# Patient Record
Sex: Female | Born: 1960 | Race: White | Hispanic: No | Smoking: Former smoker
Health system: Southern US, Community
[De-identification: ages and names within clinical notes are randomized; demographics above are authoritative.]

## PROBLEM LIST (undated history)

## (undated) DIAGNOSIS — K8050 Calculus of bile duct without cholangitis or cholecystitis without obstruction: Secondary | ICD-10-CM

## (undated) DIAGNOSIS — J45909 Unspecified asthma, uncomplicated: Secondary | ICD-10-CM

## (undated) DIAGNOSIS — E039 Hypothyroidism, unspecified: Secondary | ICD-10-CM

## (undated) DIAGNOSIS — IMO0001 Reserved for inherently not codable concepts without codable children: Secondary | ICD-10-CM

## (undated) DIAGNOSIS — C801 Malignant (primary) neoplasm, unspecified: Secondary | ICD-10-CM

## (undated) DIAGNOSIS — J449 Chronic obstructive pulmonary disease, unspecified: Secondary | ICD-10-CM

## (undated) DIAGNOSIS — I700 Atherosclerosis of aorta: Secondary | ICD-10-CM

## (undated) DIAGNOSIS — D649 Anemia, unspecified: Secondary | ICD-10-CM

## (undated) DIAGNOSIS — R634 Abnormal weight loss: Secondary | ICD-10-CM

## (undated) DIAGNOSIS — E079 Disorder of thyroid, unspecified: Secondary | ICD-10-CM

## (undated) DIAGNOSIS — J189 Pneumonia, unspecified organism: Secondary | ICD-10-CM

## (undated) HISTORY — PX: BREAST BIOPSY: SHX20

## (undated) HISTORY — PX: COLONOSCOPY: SHX174

## (undated) HISTORY — DX: Disorder of thyroid, unspecified: E07.9

---

## 2005-05-20 ENCOUNTER — Ambulatory Visit: Payer: Self-pay | Admitting: General Surgery

## 2005-05-28 ENCOUNTER — Ambulatory Visit: Payer: Self-pay | Admitting: Internal Medicine

## 2005-06-11 ENCOUNTER — Ambulatory Visit: Payer: Self-pay | Admitting: Internal Medicine

## 2006-02-10 ENCOUNTER — Ambulatory Visit: Payer: Self-pay | Admitting: Internal Medicine

## 2015-09-03 DIAGNOSIS — C3492 Malignant neoplasm of unspecified part of left bronchus or lung: Secondary | ICD-10-CM

## 2015-09-03 HISTORY — DX: Malignant neoplasm of unspecified part of left bronchus or lung: C34.92

## 2015-12-15 ENCOUNTER — Emergency Department: Payer: BLUE CROSS/BLUE SHIELD

## 2015-12-15 ENCOUNTER — Inpatient Hospital Stay
Admission: EM | Admit: 2015-12-15 | Discharge: 2015-12-17 | DRG: 193 | Disposition: A | Discharge status: Home / Self Care | Payer: BLUE CROSS/BLUE SHIELD | Attending: Specialist | Admitting: Specialist

## 2015-12-15 ENCOUNTER — Inpatient Hospital Stay: Payer: BLUE CROSS/BLUE SHIELD

## 2015-12-15 ENCOUNTER — Encounter: Payer: Self-pay | Admitting: Emergency Medicine

## 2015-12-15 DIAGNOSIS — F1010 Alcohol abuse, uncomplicated: Secondary | ICD-10-CM | POA: Diagnosis present

## 2015-12-15 DIAGNOSIS — D696 Thrombocytopenia, unspecified: Secondary | ICD-10-CM | POA: Diagnosis present

## 2015-12-15 DIAGNOSIS — Z681 Body mass index (BMI) 19 or less, adult: Secondary | ICD-10-CM | POA: Diagnosis not present

## 2015-12-15 DIAGNOSIS — J440 Chronic obstructive pulmonary disease with acute lower respiratory infection: Secondary | ICD-10-CM | POA: Diagnosis present

## 2015-12-15 DIAGNOSIS — J9811 Atelectasis: Secondary | ICD-10-CM | POA: Diagnosis present

## 2015-12-15 DIAGNOSIS — J189 Pneumonia, unspecified organism: Principal | ICD-10-CM | POA: Diagnosis present

## 2015-12-15 DIAGNOSIS — E876 Hypokalemia: Secondary | ICD-10-CM | POA: Diagnosis present

## 2015-12-15 DIAGNOSIS — E43 Unspecified severe protein-calorie malnutrition: Secondary | ICD-10-CM | POA: Diagnosis present

## 2015-12-15 DIAGNOSIS — R0602 Shortness of breath: Secondary | ICD-10-CM

## 2015-12-15 DIAGNOSIS — E440 Moderate protein-calorie malnutrition: Secondary | ICD-10-CM | POA: Insufficient documentation

## 2015-12-15 DIAGNOSIS — E861 Hypovolemia: Secondary | ICD-10-CM | POA: Diagnosis present

## 2015-12-15 DIAGNOSIS — E222 Syndrome of inappropriate secretion of antidiuretic hormone: Secondary | ICD-10-CM | POA: Diagnosis present

## 2015-12-15 DIAGNOSIS — F17210 Nicotine dependence, cigarettes, uncomplicated: Secondary | ICD-10-CM | POA: Diagnosis present

## 2015-12-15 DIAGNOSIS — D72819 Decreased white blood cell count, unspecified: Secondary | ICD-10-CM | POA: Diagnosis present

## 2015-12-15 DIAGNOSIS — A419 Sepsis, unspecified organism: Secondary | ICD-10-CM

## 2015-12-15 HISTORY — DX: Unspecified asthma, uncomplicated: J45.909

## 2015-12-15 HISTORY — DX: Chronic obstructive pulmonary disease, unspecified: J44.9

## 2015-12-15 LAB — COMPREHENSIVE METABOLIC PANEL
ALT: 73 U/L — ABNORMAL HIGH (ref 14–54)
ANION GAP: 10 (ref 5–15)
AST: 226 U/L — AB (ref 15–41)
Albumin: 3.5 g/dL (ref 3.5–5.0)
Alkaline Phosphatase: 139 U/L — ABNORMAL HIGH (ref 38–126)
BILIRUBIN TOTAL: 0.4 mg/dL (ref 0.3–1.2)
CHLORIDE: 94 mmol/L — AB (ref 101–111)
CO2: 23 mmol/L (ref 22–32)
Calcium: 7.9 mg/dL — ABNORMAL LOW (ref 8.9–10.3)
Creatinine, Ser: 0.43 mg/dL — ABNORMAL LOW (ref 0.44–1.00)
Glucose, Bld: 74 mg/dL (ref 65–99)
POTASSIUM: 3.5 mmol/L (ref 3.5–5.1)
Sodium: 127 mmol/L — ABNORMAL LOW (ref 135–145)
TOTAL PROTEIN: 6.6 g/dL (ref 6.5–8.1)

## 2015-12-15 LAB — INFLUENZA PANEL BY PCR (TYPE A & B)
H1N1 flu by pcr: NOT DETECTED
Influenza A By PCR: NEGATIVE
Influenza B By PCR: NEGATIVE

## 2015-12-15 LAB — CBC
HCT: 46.6 % (ref 35.0–47.0)
Hemoglobin: 16.2 g/dL — ABNORMAL HIGH (ref 12.0–16.0)
MCH: 35.3 pg — AB (ref 26.0–34.0)
MCHC: 34.7 g/dL (ref 32.0–36.0)
MCV: 101.8 fL — AB (ref 80.0–100.0)
PLATELETS: 149 10*3/uL — AB (ref 150–440)
RBC: 4.57 MIL/uL (ref 3.80–5.20)
RDW: 14 % (ref 11.5–14.5)
WBC: 1.1 10*3/uL — CL (ref 3.6–11.0)

## 2015-12-15 LAB — LACTIC ACID, PLASMA
LACTIC ACID, VENOUS: 1.5 mmol/L (ref 0.5–2.0)
LACTIC ACID, VENOUS: 1.1 mmol/L (ref 0.5–2.0)

## 2015-12-15 LAB — RAPID INFLUENZA A&B ANTIGENS: Influenza A (ARMC): NEGATIVE

## 2015-12-15 LAB — RAPID INFLUENZA A&B ANTIGENS (ARMC ONLY): INFLUENZA B (ARMC): NEGATIVE

## 2015-12-15 MED ORDER — SODIUM CHLORIDE 0.9 % IV BOLUS (SEPSIS)
1143.0000 mL | Freq: Once | INTRAVENOUS | Status: AC
  Administered 2015-12-15: 1143 mL via INTRAVENOUS

## 2015-12-15 MED ORDER — BENZONATATE 100 MG PO CAPS
100.0000 mg | ORAL_CAPSULE | Freq: Three times a day (TID) | ORAL | Status: DC | PRN
  Administered 2015-12-15: 100 mg via ORAL
  Filled 2015-12-15: qty 1

## 2015-12-15 MED ORDER — ENOXAPARIN SODIUM 40 MG/0.4ML ~~LOC~~ SOLN
40.0000 mg | SUBCUTANEOUS | Status: DC
  Administered 2015-12-15 – 2015-12-16 (×2): 40 mg via SUBCUTANEOUS
  Filled 2015-12-15 (×2): qty 0.4

## 2015-12-15 MED ORDER — IPRATROPIUM-ALBUTEROL 0.5-2.5 (3) MG/3ML IN SOLN: 3.0000 mL | Freq: Four times a day (QID) | RESPIRATORY_TRACT | Status: DC | PRN

## 2015-12-15 MED ORDER — IOPAMIDOL (ISOVUE-370) INJECTION 76%
75.0000 mL | Freq: Once | INTRAVENOUS | Status: AC | PRN
  Administered 2015-12-15: 75 mL via INTRAVENOUS

## 2015-12-15 MED ORDER — ONDANSETRON HCL 4 MG PO TABS: 4.0000 mg | ORAL_TABLET | Freq: Four times a day (QID) | ORAL | Status: DC | PRN

## 2015-12-15 MED ORDER — ACETAMINOPHEN 325 MG PO TABS
650.0000 mg | ORAL_TABLET | Freq: Four times a day (QID) | ORAL | Status: DC | PRN
Start: 2015-12-15 — End: 2015-12-17
  Administered 2015-12-15: 650 mg via ORAL
  Filled 2015-12-15: qty 2

## 2015-12-15 MED ORDER — LEVOFLOXACIN IN D5W 250 MG/50ML IV SOLN
250.0000 mg | INTRAVENOUS | Status: DC
  Filled 2015-12-15: qty 50

## 2015-12-15 MED ORDER — ONDANSETRON HCL 4 MG/2ML IJ SOLN: 4.0000 mg | Freq: Four times a day (QID) | INTRAMUSCULAR | Status: DC | PRN

## 2015-12-15 MED ORDER — PNEUMOCOCCAL VAC POLYVALENT 25 MCG/0.5ML IJ INJ: 0.5000 mL | INJECTION | INTRAMUSCULAR | Status: DC

## 2015-12-15 MED ORDER — DEXTROSE 5 % IV SOLN
500.0000 mg | Freq: Once | INTRAVENOUS | Status: AC
  Administered 2015-12-15: 500 mg via INTRAVENOUS
  Filled 2015-12-15: qty 500

## 2015-12-15 MED ORDER — GUAIFENESIN-DM 100-10 MG/5ML PO SYRP
5.0000 mL | ORAL_SOLUTION | ORAL | Status: DC | PRN
  Administered 2015-12-15 – 2015-12-17 (×4): 5 mL via ORAL
  Filled 2015-12-15 (×4): qty 5

## 2015-12-15 MED ORDER — DEXTROSE 5 % IV SOLN
1.0000 g | Freq: Once | INTRAVENOUS | Status: AC
  Administered 2015-12-15: 1 g via INTRAVENOUS
  Filled 2015-12-15: qty 10

## 2015-12-15 MED ORDER — SODIUM CHLORIDE 0.9 % IV SOLN
INTRAVENOUS | Status: DC
  Administered 2015-12-15 – 2015-12-16 (×3): via INTRAVENOUS

## 2015-12-15 MED ORDER — ACETAMINOPHEN 650 MG RE SUPP: 650.0000 mg | Freq: Four times a day (QID) | RECTAL | Status: DC | PRN

## 2015-12-15 MED ORDER — LEVOFLOXACIN IN D5W 500 MG/100ML IV SOLN
500.0000 mg | Freq: Once | INTRAVENOUS | Status: AC
  Administered 2015-12-16: 500 mg via INTRAVENOUS
  Filled 2015-12-15: qty 100

## 2015-12-15 NOTE — ED Notes (Signed)
Admitting MD at bedside.

## 2015-12-15 NOTE — ED Notes (Signed)
Pt ambulated to bathroom at this time independently with no concerns, pt tolerating well NAD noted, will continue to monitor. Family at bedside.

## 2015-12-15 NOTE — H&P (Signed)
[facility] [place] NAME: Lori Rangel    MR#:  [id number]  DATE OF BIRTH:  10/11/1960  DATE OF ADMISSION:  12/15/2015  PRIMARY CARE PHYSICIAN: No primary care provider on file.   REQUESTING/REFERRING PHYSICIAN: Dr. Darrick Penna  CHIEF COMPLAINT:   Chief Complaint  Patient presents with  . Cough    HISTORY OF PRESENT ILLNESS:  Lori Rangel  is a 55 y.o. female with a known history of COPD who presents to the hospital due to cough over the past week or so. Patient says that she's had a cough which is productive of yellow sputum over the past week and is somewhat improving. She admits to about a 10-13 pound weight loss over the past couple months due to poor by mouth intake. She denies any nausea, vomiting, fevers, chills, chest pain. She does admit to some shortness of breath which is mostly at exertion. She presented to the emergency room as she was not improving and her chest x-ray findings were suggestive of a right middle lobe pneumonia. Hospitalist services were contacted further treatment and evaluation.  PAST MEDICAL HISTORY:   Past Medical History  Diagnosis Date  . COPD (chronic obstructive pulmonary disease) ([place])     PAST SURGICAL HISTORY:  History reviewed. No pertinent past surgical history.  SOCIAL HISTORY:   Social History  Substance Use Topics  . Smoking status: Current Every Day Smoker -- 0.75 packs/day for 40 years    Types: Cigarettes  . Smokeless tobacco: Not on file  . Alcohol Use: 0.0 oz/week    0 Standard drinks or equivalent per week     Comment: 5 beers daily    FAMILY HISTORY:   Family History  Problem Relation Age of Onset  . Cirrhosis Father     DRUG ALLERGIES:  No Known Allergies  REVIEW OF SYSTEMS:   Review of Systems  Constitutional: Positive for weight loss. Negative for fever.  HENT: Negative for congestion, nosebleeds and tinnitus.   Eyes: Negative for blurred vision, double vision  and redness.  Respiratory: Positive for cough, sputum production and shortness of breath. Negative for hemoptysis.   Cardiovascular: Negative for chest pain, orthopnea, leg swelling and PND.  Gastrointestinal: Negative for nausea, vomiting, abdominal pain, diarrhea and melena.  Genitourinary: Negative for dysuria, urgency and hematuria.  Musculoskeletal: Negative for joint pain and falls.  Neurological: Positive for weakness. Negative for dizziness, tingling, sensory change, focal weakness, seizures and headaches.  Endo/Heme/Allergies: Negative for polydipsia. Does not bruise/bleed easily.  Psychiatric/Behavioral: Negative for depression and memory loss. The patient is not nervous/anxious.     MEDICATIONS AT HOME:   Prior to Admission medications   Not on File      VITAL SIGNS:  Blood pressure 146/66, pulse 76, temperature 98.3 F (36.8 C), temperature source Oral, resp. rate 24, height '5\' 2"'$  (1.575 m), weight 38.102 kg (84 lb), SpO2 99 %.  PHYSICAL EXAMINATION:  Physical Exam  GENERAL:  55 y.o.-year-old Cachectic patient lying in the bed in no acute distress.  EYES: Pupils equal, round, reactive to light and accommodation. No scleral icterus. Extraocular muscles intact.  HEENT: Head atraumatic, normocephalic. Oropharynx and nasopharynx clear. No oropharyngeal erythema, moist oral mucosa  NECK:  Supple, no jugular venous distention. No thyroid enlargement, no tenderness.  LUNGS: Normal breath sounds bilaterally, no wheezing, rales, rhonchi. No use of accessory muscles of respiration.  CARDIOVASCULAR: S1, S2 RRR. No murmurs, rubs, gallops, clicks.  ABDOMEN: Soft, nontender, nondistended.  Bowel sounds present. No organomegaly or mass.  EXTREMITIES: No pedal edema, cyanosis, or clubbing. + 2 pedal & radial pulses b/l.   NEUROLOGIC: Cranial nerves II through XII are intact. No focal Motor or sensory deficits appreciated b/l PSYCHIATRIC: The patient is alert and oriented x 3. Good  affect.  SKIN: No obvious rash, lesion, or ulcer.   LABORATORY PANEL:   CBC  Recent Labs Lab 12/15/15 1024  WBC 1.1*  HGB 16.2*  HCT 46.6  PLT 149*   ------------------------------------------------------------------------------------------------------------------  Chemistries   Recent Labs Lab 12/15/15 1024  NA 127*  K 3.5  CL 94*  CO2 23  GLUCOSE 74  BUN <5*  CREATININE 0.43*  CALCIUM 7.9*  AST 226*  ALT 73*  ALKPHOS 139*  BILITOT 0.4   ------------------------------------------------------------------------------------------------------------------  Cardiac Enzymes No results for input(s): TROPONINI in the last 168 hours. ------------------------------------------------------------------------------------------------------------------  RADIOLOGY:  Dg Chest 2 View  12/15/2015  CLINICAL DATA:  Cough, chest congestion for the past week; history of COPD and previous episodes of pneumonia and bronchitis, current smoker. EXAM: CHEST  2 VIEW COMPARISON:  None in PACs FINDINGS: The lungs are hyperinflated with mild hemidiaphragm flattening and increased AP dimension of the thorax. There is atelectasis of a portion of the right middle lobe. There is no alveolar infiltrate or pleural effusion. There is subtle nodular density in the left suprahilar region. The trachea is midline. The heart and pulmonary vascularity are normal. There is calcification in the wall of the descending thoracic aorta. The bony thorax exhibits no acute abnormality. IMPRESSION: COPD. Atelectasis or early pneumonia in the right middle lobe. Subtle nodular density in the left suprahilar region likely anteriorly in the inferior aspect of the upper lobe. At minimum followup PA and lateral chest X-ray is recommended in 3-4 weeks following trial of antibiotic therapy to ensure resolution and exclude underlying malignancy. However given the possibly postobstructive findings in the right middle lobe and the subtle  nodule in the left suprahilar region, chest CT scanning now would be a useful next imaging step to exclude a centrally obstructing lesion and/or abnormal parenchymal mass. Electronically Signed   By: David  Martinique M.D.   On: 12/15/2015 11:26     IMPRESSION AND PLAN:   55 year old female with past medical history of COPD who presents to the hospital with cough, weight loss and shortness of breath.  1. Pneumonia-this is the cause of patient's shortness of breath cough. -Patient's chest x-ray was suggestive of a right middle lobe pneumonia. Questionable this is a postobstructive pneumonia. -For now I will treat the patient with IV Levaquin. She received 1 dose of ceftriaxone and Zithromax in the ER. -follow blood, sputum cultures.  2. Abnormal chest x-ray-patient's chest x-ray suggested a right middle lobe pneumonia which could possibly postobstructive. -Patient does have significant risk factors for malignancy given her long-term tobacco abuse. -We'll get a CT scan with contrast to further evaluate this.  3. Hyponatremia-hypovolemic versus SIADH given her lung disease. Next line-I will gently hydrate her with IV fluids. Follow sodium. -Check urine and serum osmolality.  4. Leukopenia-etiology unclear possibly related to underlying infectious cause. -We'll follow counts.  5. COPD-no acute exacerbation. -When necessary DuoNeb's.   All the records are reviewed and case discussed with ED provider. Management plans discussed with the patient, family and they are in agreement.  CODE STATUS: Full  TOTAL TIME TAKING CARE OF THIS PATIENT: 45 minutes.    Henreitta Leber M.D on 12/15/2015 at 12:14 PM  Between 7am  to 6pm - Pager - [phone]  After 6pm go to [email or web address] - password [email or web address] [facility]  [facility]  Office  [phone]  CC: Primary care physician; No primary care provider on file.

## 2015-12-15 NOTE — ED Provider Notes (Signed)
[facility] Emergency Department Provider Note  ____________________________________________  Time seen: Approximately 11:31 AM  I have reviewed the triage vital signs and the nursing notes.   HISTORY  Chief Complaint Cough    HPI Lori Rangel is a 55 y.o. female history of COPD and alcoholism who presents for evaluation of 3-4 days of productive cough, shortness of breath, subjective fevers, gradual onset, constant since onset, currently moderate, no modifying factors. No chest pain. No abdominal pain, vomiting, diarrhea or dysuria.   Past Medical History  Diagnosis Date  . COPD (chronic obstructive pulmonary disease) ([place])   . Asthma     Patient Active Problem List   Diagnosis Date Noted  . Pneumonia 12/15/2015    History reviewed. No pertinent past surgical history.  No current outpatient prescriptions on file.  Allergies Review of patient's allergies indicates no known allergies.  Family History  Problem Relation Age of Onset  . Cirrhosis Father     Social History Social History  Substance Use Topics  . Smoking status: Current Every Day Smoker -- 0.75 packs/day for 40 years    Types: Cigarettes  . Smokeless tobacco: None  . Alcohol Use: 0.0 oz/week    0 Standard drinks or equivalent per week     Comment: 5 beers daily    Review of Systems Constitutional: +subjective fever/chills Eyes: No visual changes. ENT: No sore throat. Cardiovascular: Denies chest pain. Respiratory: + shortness of breath. Gastrointestinal: No abdominal pain.  No nausea, no vomiting.  No diarrhea.  No constipation. Genitourinary: Negative for dysuria. Musculoskeletal: Negative for back pain. Skin: Negative for rash. Neurological: Negative for headaches, focal weakness or numbness.  10-point ROS otherwise negative.  ____________________________________________   PHYSICAL EXAM:  VITAL SIGNS: ED Triage Vitals  Enc Vitals Group     BP 12/15/15  1016 146/66 mmHg     Pulse Rate 12/15/15 1016 76     Resp 12/15/15 1016 24     Temp 12/15/15 1016 98.3 F (36.8 C)     Temp Source 12/15/15 1016 Oral     SpO2 12/15/15 1016 99 %     Weight 12/15/15 1016 84 lb (38.102 kg)     Height 12/15/15 1016 '5\' 2"'$  (1.575 m)     Head Cir --      Peak Flow --      Pain Score 12/15/15 1022 0     Pain Loc --      Pain Edu? --      Excl. in [place]? --     Constitutional: Alert and oriented. Chronically ill-appearing with frequent cough. Eyes: Conjunctivae are normal. PERRL. EOMI. Head: Atraumatic. Nose: No congestion/rhinnorhea. Mouth/Throat: Mucous membranes are moist.  Oropharynx non-erythematous. Neck: No stridor.  Supple without meningismus. Cardiovascular: Normal rate, regular rhythm. Grossly normal heart sounds.  Good peripheral circulation. Respiratory: tachypnea with mildly increased work of breathing, globally diminished breath sounds with faint expiratory wheeze. Gastrointestinal: Soft and nontender. No distention. No CVA tenderness. Genitourinary: deferred Musculoskeletal: No lower extremity tenderness nor edema.  No joint effusions. Neurologic:  Normal speech and language. No gross focal neurologic deficits are appreciated. No gait instability. Skin:  Skin is warm, dry and intact. No rash noted. Psychiatric: Mood and affect are normal. Speech and behavior are normal.  ____________________________________________   LABS (all labs ordered are listed, but only abnormal results are displayed)  Labs Reviewed  CBC - Abnormal; Notable for the following:    WBC 1.1 (*)    Hemoglobin 16.2 (*)  MCV 101.8 (*)    MCH 35.3 (*)    Platelets 149 (*)    All other components within normal limits  COMPREHENSIVE METABOLIC PANEL - Abnormal; Notable for the following:    Sodium 127 (*)    Chloride 94 (*)    BUN <5 (*)    Creatinine, Ser 0.43 (*)    Calcium 7.9 (*)    AST 226 (*)    ALT 73 (*)    Alkaline Phosphatase 139 (*)    All other  components within normal limits  RAPID INFLUENZA A&B ANTIGENS ([facility] ONLY)  CULTURE, BLOOD (ROUTINE X 2)  CULTURE, BLOOD (ROUTINE X 2)  LACTIC ACID, PLASMA  LACTIC ACID, PLASMA   ____________________________________________  EKG  ED ECG REPORT I, Joanne Gavel, the attending physician, personally viewed and interpreted this ECG.   Date: 12/15/2015  EKG Time: 10:14  Rate: 75  Rhythm: normal sinus rhythm  Axis: left  Intervals:none  ST&T Change: No acute ST elevation. Q waves in V2, V3.  ____________________________________________  RADIOLOGY  CXR IMPRESSION: COPD. Atelectasis or early pneumonia in the right middle lobe. Subtle nodular density in the left suprahilar region likely anteriorly in the inferior aspect of the upper lobe.  At minimum followup PA and lateral chest X-ray is recommended in 3-4 weeks following trial of antibiotic therapy to ensure resolution and exclude underlying malignancy. However given the possibly postobstructive findings in the right middle lobe and the subtle nodule in the left suprahilar region, chest CT scanning now would be a useful next imaging step to exclude a centrally obstructing lesion and/or abnormal parenchymal mass.  ____________________________________________   PROCEDURES  Procedure(s) performed: None  Critical Care performed: Yes, see critical care note(s). Total critical care time spent 35 minutes.  ____________________________________________   INITIAL IMPRESSION / ASSESSMENT AND PLAN / ED COURSE  Pertinent labs & imaging results that were available during my care of the patient were reviewed by me and considered in my medical decision making (see chart for details).  Lori Rangel is a 55 y.o. female history of COPD and alcoholism who presents for evaluation of 3-4 days of productive cough, shortness of breath, subjective fevers. On exam, she is chronically ill-appearing with frequent cough. She is tachypneic  with mildly increased work of breathing, faint expiratory wheezing, we diminished breath sounds. She is afebrile and the remainder of her vital signs are stable. Labs reviewed and are notable for severe leukopenia with white blood cell count 1.1. She also has mild thrombocytopenia as well as hemoglobin which is elevated at 16.2 which may be secondary to hemoconcentration. He is hyponatremic with a sodium of 127. AST and ALT are elevated and I suspect this is secondary to her alcohol use. Flu negative. She is meeting septic criteria for leukopenia and tachypnea and her chest x-ray shows early pneumonia. We'll treat with IV fluids, Ceftriaxone and azithromycin. Case discussed with the hospitalist, Dr. Tor Netters, for admission at 12:07 PM.     FINAL CLINICAL IMPRESSION(S) / ED DIAGNOSES  Final diagnoses:  Community acquired pneumonia  Sepsis due to pneumonia [facility])      Joanne Gavel, MD 12/15/15 1324

## 2015-12-15 NOTE — Progress Notes (Signed)
Droplet precaution discontinued after influenza PCR came back negative

## 2015-12-15 NOTE — ED Notes (Signed)
Pt reports cough/chest congestion x3 days; reports hx of COPD, reports she was running a fever.

## 2015-12-15 NOTE — Consult Note (Addendum)
[facility] Consultation      Assessment and Plan:  Lung nodule. -1.4 cm lung nodule, left upper lobe. Intermediate risk of lung cancer. -I discussed with the patient as well as the patient's family in the room, various options including biopsy, surveillance with imaging, no follow-up. -Given her relatively young age, and good functional status, we opted to attempt to obtain a tissue biopsy. The nodule is in a somewhat difficult to position to Biopsy bronchoscopically, as well as by CT-guided biopsy, given the presence of emphysema. -The patient will need to follow up with [place] outpatient next week, at that time we will obtain a pulmonary function test, and reevaluate to see whether her current symptoms have improved. At that time we can refer the patient for further biopsy.  Emphysema. -Patient has emphysematous changes in both lungs seen on CT chest. -We'll need to obtain outpatient. Bony function test. -We'll start the patient on a long-acting inhaler.  Pneumonia. -Continue current antibiotic coverage.  Nicotine Abuse.  -Discussed smoking cessation.   Pulmonary service will sign off for now, please call if there are any questions or concerns.   Date: 12/15/2015  MRN# [id number] Lori Rangel [birth year]  Referring Physician: Dr. Rachael Rangel [name] is a 55 y.o. old female seen in consultation for chief complaint of:    Chief Complaint  Patient presents with  . Cough    HPI:   The patient is a 55 year old female who presented with 1 week of cough and yellow sputum over the last 1 week. Her daughters are also present in the room provide some of the history, they have noted that she has lost some weight over the last few weeks has been having increasing cough and dyspnea. She has a known history of emphysema, she has a albuterol inhaler which she uses on a when necessary basis. She does not see a pulmonologist outpatient. She has fairly good functional  status. 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 She per her chart. She also has a history of smoking, approximately three quarters of a pack per day. She is able to walk around a grocery store without issue. She also goes to work every day and does not become tired or short winded while working. She has no other particular complaints at this time.    PMHX:   Past Medical History  Diagnosis Date  . COPD (chronic obstructive pulmonary disease) ([place])   . Asthma    Surgical Hx:  History reviewed. No pertinent past surgical history. Family Hx:  Family History  Problem Relation Age of Onset  . Cirrhosis Father    Social Hx:   Social History  Substance Use Topics  . Smoking status: Current Every Day Smoker -- 0.75 packs/day for 40 years    Types: Cigarettes  . Smokeless tobacco: None  . Alcohol Use: 0.0 oz/week    0 Standard drinks or equivalent per week     Comment: 5 beers daily   Medication:   No current outpatient prescriptions on file.    Allergies:  Review of patient's allergies indicates no known allergies.  Review of Systems: Gen:  Denies  fever, sweats, chills HEENT: Denies blurred vision, double vision. bleeds, sore throat Cvc:  No dizziness, chest pain. Resp:   Denies wheezing, decreased air entry in both lungs. Gi: Denies swallowing difficulty, stomach pain. Gu:  Denies bladder incontinence, burning urine Ext:   No Joint pain,  stiffness. Skin: No skin rash,  hives  Endoc:  No polyuria, polydipsia. Psych: No depression, insomnia. Other:  All other systems were reviewed with the patient and were negative other that what is mentioned in the HPI.   Physical Examination:   VS: BP 157/65 mmHg  Pulse 81  Temp(Src) 99 F (37.2 C) (Oral)  Resp 18  Ht '5\' 2"'$  (1.575 m)  Wt 79 lb 11.2 oz (36.152 kg)  BMI 14.57 kg/m2  SpO2 96%  General Appearance: No distress  Neuro:without focal findings,  speech normal,  HEENT:  PERRLA, EOM intact.   Pulmonary: normal breath sounds, No wheezing.  CardiovascularNormal S1,S2.  No m/r/g.   Abdomen: Benign, Soft, non-tender. Renal:  No costovertebral tenderness  GU:  No performed at this time. Endoc: No evident thyromegaly, no signs of acromegaly. Skin:   warm, no rashes, no ecchymosis  Extremities: normal, no cyanosis, clubbing.  Other findings:    LABORATORY PANEL:   CBC  Recent Labs Lab 12/15/15 1024  WBC 1.1*  HGB 16.2*  HCT 46.6  PLT 149*   ------------------------------------------------------------------------------------------------------------------  Chemistries   Recent Labs Lab 12/15/15 1024  NA 127*  K 3.5  CL 94*  CO2 23  GLUCOSE 74  BUN <5*  CREATININE 0.43*  CALCIUM 7.9*  AST 226*  ALT 73*  ALKPHOS 139*  BILITOT 0.4   ------------------------------------------------------------------------------------------------------------------  Cardiac Enzymes No results for input(s): TROPONINI in the last 168 hours. ------------------------------------------------------------  RADIOLOGY:  Dg Chest 2 View  12/15/2015  CLINICAL DATA:  Cough, chest congestion for the past week; history of COPD and previous episodes of pneumonia and bronchitis, current smoker. EXAM: CHEST  2 VIEW COMPARISON:  None in PACs FINDINGS: The lungs are hyperinflated with mild hemidiaphragm flattening and increased AP dimension of the thorax. There is atelectasis of a portion of the right middle lobe. There is no alveolar infiltrate or pleural effusion. There is subtle nodular density in the left suprahilar region. The trachea is midline. The heart and pulmonary vascularity are normal. There is calcification in the wall of the descending thoracic aorta. The bony thorax exhibits no acute abnormality. IMPRESSION: COPD. Atelectasis or early pneumonia in the right middle lobe. Subtle nodular density in the left suprahilar region likely anteriorly in the inferior aspect of  the upper lobe. At minimum followup PA and lateral chest X-ray is recommended in 3-4 weeks following trial of antibiotic therapy to ensure resolution and exclude underlying malignancy. However given the possibly postobstructive findings in the right middle lobe and the subtle nodule in the left suprahilar region, chest CT scanning now would be a useful next imaging step to exclude a centrally obstructing lesion and/or abnormal parenchymal mass. Electronically Signed   By: David  Martinique M.D.   On: 12/15/2015 11:26   Ct Angio Chest Pe W/cm &/or Wo Cm  12/15/2015  CLINICAL DATA:  Shortness of breath and cough EXAM: CT ANGIOGRAPHY CHEST WITH CONTRAST TECHNIQUE: Multidetector CT imaging of the chest was performed using the standard protocol during bolus administration of intravenous contrast. Multiplanar CT image reconstructions and MIPs were obtained to evaluate the vascular anatomy. CONTRAST:  75 mL Isovue 370 nonionic COMPARISON:  Chest radiograph December 15, 2015 FINDINGS: Mediastinum/Lymph Nodes: There is no demonstrable pulmonary embolus. There is atherosclerotic change in the thoracic and upper abdominal aorta. There is no thoracic aortic aneurysm or dissection. The visualized great vessels appear unremarkable except for scattered foci of calcification. The pericardium is not appreciably thickened. There are foci of coronary artery  calcification. Thyroid appears normal. There is no demonstrable thoracic adenopathy. Lungs/Pleura: There is a degree of underlying centrilobular emphysematous change. There is a spiculated nodular lesion in the anterior segment of the left upper lobe measuring 1.4 x 1.3 cm. There is airspace consolidation with volume loss and the lateral segment of the right middle lobe. There is patchy atelectasis in the inferior lingula with a small area of airspace consolidation in the inferior lingula. Upper abdomen: In the visualized upper abdomen, there is hepatic steatosis. There is  atherosclerotic calcification in the aorta. No adrenal lesions are evident. Visualized upper abdominal structures otherwise appear unremarkable. Musculoskeletal: There is degenerative change in the thoracic spine. There are no blastic or lytic bone lesions. Review of the MIP images confirms the above findings. IMPRESSION: No demonstrable pulmonary embolus. Spiculated nodular lesion measuring 1.4 x 1.3 cm in the anterior segment of the left upper lobe. Neoplastic etiology suspected. It may well be prudent to obtain a PET-CT examination for further assessment in this regard. Areas of infiltrate lateral segment right middle lobe and in the inferior lingula. There is also atelectatic change in the inferior lingula. There is a degree of underlying centrilobular emphysematous change. No adenopathy. Areas of atherosclerotic calcification in the aorta. There are foci of coronary artery calcification. Hepatic steatosis. Electronically Signed   By: Lowella Grip III M.D.   On: 12/15/2015 12:54       Thank  you for the consultation and for allowing [facility] to assist in the care of your patient. Our recommendations are noted above.  Please contact us if we can be of further service.   Marda Stalker, MD.  Board Certified in Internal Medicine, Pulmonary Medicine, [place], and Sleep Medicine.  [facility] Office Number: [phone]  Patricia Pesa, M.D.  Vilinda Boehringer, M.D.  Merton Border, M.D  12/15/2015

## 2015-12-15 NOTE — Progress Notes (Signed)
Pharmacy Antibiotic Note  Lori Rangel is a 55 y.o. female admitted on 12/15/2015 with pneumonia.  Pharmacy has been consulted for Levaquin dosing.  Plan: Patient has received Ceftriaxone and Azithromycin today already 4/14. Will order Levaquin '500mg'$  IV x 1 to start 4/15. Will then continue with Levaquin '250mg'$  IV q24h.   Height: '5\' 2"'$  (157.5 cm) Weight: 84 lb (38.102 kg) IBW/kg (Calculated) : 50.1  Temp (24hrs), Avg:98.7 F (37.1 C), Min:98.3 F (36.8 C), Max:99 F (37.2 C)   Recent Labs Lab 12/15/15 1024 12/15/15 1159  WBC 1.1*  --   CREATININE 0.43*  --   LATICACIDVEN  --  1.5    Estimated Creatinine Clearance: 47.2 mL/min (by C-G formula based on Cr of 0.43).    No Known Allergies  Antimicrobials this admission: CTX x 1 dose 4/14  Azith x 1 dose 4/14 Levaquin  4/15 >>   Dose adjustments this admission:   Microbiology results: 4/14 BCx:  4/14 Influenza negative  UCx:   Sputum:   MRSA PCR:   Thank you for allowing pharmacy to be a part of this patient's care.  Desi Rowe A 12/15/2015 2:56 PM

## 2015-12-16 LAB — BASIC METABOLIC PANEL
ANION GAP: 7 (ref 5–15)
BUN: <5 mg/dL — ABNORMAL LOW (ref 6–20)
CALCIUM: 7.3 mg/dL — AB (ref 8.9–10.3)
CHLORIDE: 101 mmol/L (ref 101–111)
CO2: 22 mmol/L (ref 22–32)
Creatinine, Ser: 0.48 mg/dL (ref 0.44–1.00)
GFR calc non Af Amer: >60 mL/min (ref >60)
Glucose, Bld: 68 mg/dL (ref 65–99)
POTASSIUM: 2.9 mmol/L — AB (ref 3.5–5.1)
Sodium: 130 mmol/L — ABNORMAL LOW (ref 135–145)

## 2015-12-16 LAB — CBC
HCT: 39.7 % (ref 35.0–47.0)
Hemoglobin: 13.7 g/dL (ref 12.0–16.0)
MCH: 35.5 pg — AB (ref 26.0–34.0)
MCHC: 34.4 g/dL (ref 32.0–36.0)
MCV: 103.2 fL — ABNORMAL HIGH (ref 80.0–100.0)
PLATELETS: 124 10*3/uL — AB (ref 150–440)
RBC: 3.85 MIL/uL (ref 3.80–5.20)
RDW: 14.1 % (ref 11.5–14.5)
WBC: 1.1 10*3/uL — AB (ref 3.6–11.0)

## 2015-12-16 MED ORDER — POTASSIUM CHLORIDE 10 MEQ/100ML IV SOLN
10.0000 meq | INTRAVENOUS | Status: AC
  Administered 2015-12-16 (×2): 10 meq via INTRAVENOUS
  Filled 2015-12-16 (×2): qty 100

## 2015-12-16 MED ORDER — POTASSIUM CHLORIDE 20 MEQ PO PACK
40.0000 meq | PACK | Freq: Once | ORAL | Status: AC
  Administered 2015-12-16: 40 meq via ORAL
  Filled 2015-12-16: qty 2

## 2015-12-16 NOTE — Progress Notes (Signed)
Called Dr. Estanislado Pandy about patient's critical lab values- potassium 2.9, WBC 1.1.  Doctor put in appropriate orders.  Phoebe Sharps N   12/16/2015  5:30 AM

## 2015-12-16 NOTE — Progress Notes (Signed)
[facility] at [place] NAME: Lori Rangel    MR#:  [id number]  DATE OF BIRTH:  25-Jan-1961  SUBJECTIVE:  Patient is here due to cough and to me acquired pneumonia. Also noted to have a spiculated lung mass on the left side of the lung on CT chest. No hemoptysis, family at bedside. Feels a lot better since yesterday. Noted to be mildly hypokalemic this morning.  REVIEW OF SYSTEMS:    Review of Systems  Constitutional: Negative for fever and chills.  HENT: Negative for congestion and tinnitus.   Eyes: Negative for blurred vision and double vision.  Respiratory: Positive for cough, sputum production and shortness of breath. Negative for wheezing.   Cardiovascular: Negative for chest pain, orthopnea and PND.  Gastrointestinal: Negative for nausea, vomiting, abdominal pain and diarrhea.  Genitourinary: Negative for dysuria and hematuria.  Neurological: Negative for dizziness, sensory change and focal weakness.  All other systems reviewed and are negative.   Nutrition: Regular Tolerating Diet: Yes Tolerating PT: Ambulatory     DRUG ALLERGIES:  No Known Allergies  VITALS:  Blood pressure 140/73, pulse 70, temperature 98.7 F (37.1 C), temperature source Oral, resp. rate 20, height '5\' 2"'$  (1.575 m), weight 36.152 kg (79 lb 11.2 oz), SpO2 95 %.  PHYSICAL EXAMINATION:   Physical Exam  GENERAL:  55 y.o.-year-old cachectic patient lying in the bed in no acute distress.  EYES: Pupils equal, round, reactive to light and accommodation. No scleral icterus. Extraocular muscles intact.  HEENT: Head atraumatic, normocephalic. Oropharynx and nasopharynx clear.  NECK:  Supple, no jugular venous distention. No thyroid enlargement, no tenderness.  LUNGS: Poor inspiratory and expiratory phase, minimal end expiratory wheezing, No rales, rhonchi. No use of accessory muscles of respiration.  CARDIOVASCULAR: S1, S2 normal. No murmurs, rubs, or gallops.   ABDOMEN: Soft, nontender, nondistended. Bowel sounds present. No organomegaly or mass.  EXTREMITIES: No cyanosis, clubbing or edema b/l.    NEUROLOGIC: Cranial nerves II through XII are intact. No focal Motor or sensory deficits b/l.   PSYCHIATRIC: The patient is alert and oriented x 3.  SKIN: No obvious rash, lesion, or ulcer.    LABORATORY PANEL:   CBC  Recent Labs Lab 12/16/15 0436  WBC 1.1*  HGB 13.7  HCT 39.7  PLT 124*   ------------------------------------------------------------------------------------------------------------------  Chemistries   Recent Labs Lab 12/15/15 1024 12/16/15 0436  NA 127* 130*  K 3.5 2.9*  CL 94* 101  CO2 23 22  GLUCOSE 74 68  BUN <5* <5*  CREATININE 0.43* 0.48  CALCIUM 7.9* 7.3*  AST 226*  --   ALT 73*  --   ALKPHOS 139*  --   BILITOT 0.4  --    ------------------------------------------------------------------------------------------------------------------  Cardiac Enzymes No results for input(s): TROPONINI in the last 168 hours. ------------------------------------------------------------------------------------------------------------------  RADIOLOGY:  Dg Chest 2 View  12/15/2015  CLINICAL DATA:  Cough, chest congestion for the past week; history of COPD and previous episodes of pneumonia and bronchitis, current smoker. EXAM: CHEST  2 VIEW COMPARISON:  None in PACs FINDINGS: The lungs are hyperinflated with mild hemidiaphragm flattening and increased AP dimension of the thorax. There is atelectasis of a portion of the right middle lobe. There is no alveolar infiltrate or pleural effusion. There is subtle nodular density in the left suprahilar region. The trachea is midline. The heart and pulmonary vascularity are normal. There is calcification in the wall of the descending thoracic aorta. The bony thorax exhibits no acute  abnormality. IMPRESSION: COPD. Atelectasis or early pneumonia in the right middle lobe. Subtle nodular  density in the left suprahilar region likely anteriorly in the inferior aspect of the upper lobe. At minimum followup PA and lateral chest X-ray is recommended in 3-4 weeks following trial of antibiotic therapy to ensure resolution and exclude underlying malignancy. However given the possibly postobstructive findings in the right middle lobe and the subtle nodule in the left suprahilar region, chest CT scanning now would be a useful next imaging step to exclude a centrally obstructing lesion and/or abnormal parenchymal mass. Electronically Signed   By: David  Martinique M.D.   On: 12/15/2015 11:26   Ct Angio Chest Pe W/cm &/or Wo Cm  12/15/2015  CLINICAL DATA:  Shortness of breath and cough EXAM: CT ANGIOGRAPHY CHEST WITH CONTRAST TECHNIQUE: Multidetector CT imaging of the chest was performed using the standard protocol during bolus administration of intravenous contrast. Multiplanar CT image reconstructions and MIPs were obtained to evaluate the vascular anatomy. CONTRAST:  75 mL Isovue 370 nonionic COMPARISON:  Chest radiograph December 15, 2015 FINDINGS: Mediastinum/Lymph Nodes: There is no demonstrable pulmonary embolus. There is atherosclerotic change in the thoracic and upper abdominal aorta. There is no thoracic aortic aneurysm or dissection. The visualized great vessels appear unremarkable except for scattered foci of calcification. The pericardium is not appreciably thickened. There are foci of coronary artery calcification. Thyroid appears normal. There is no demonstrable thoracic adenopathy. Lungs/Pleura: There is a degree of underlying centrilobular emphysematous change. There is a spiculated nodular lesion in the anterior segment of the left upper lobe measuring 1.4 x 1.3 cm. There is airspace consolidation with volume loss and the lateral segment of the right middle lobe. There is patchy atelectasis in the inferior lingula with a small area of airspace consolidation in the inferior lingula. Upper abdomen:  In the visualized upper abdomen, there is hepatic steatosis. There is atherosclerotic calcification in the aorta. No adrenal lesions are evident. Visualized upper abdominal structures otherwise appear unremarkable. Musculoskeletal: There is degenerative change in the thoracic spine. There are no blastic or lytic bone lesions. Review of the MIP images confirms the above findings. IMPRESSION: No demonstrable pulmonary embolus. Spiculated nodular lesion measuring 1.4 x 1.3 cm in the anterior segment of the left upper lobe. Neoplastic etiology suspected. It may well be prudent to obtain a PET-CT examination for further assessment in this regard. Areas of infiltrate lateral segment right middle lobe and in the inferior lingula. There is also atelectatic change in the inferior lingula. There is a degree of underlying centrilobular emphysematous change. No adenopathy. Areas of atherosclerotic calcification in the aorta. There are foci of coronary artery calcification. Hepatic steatosis. Electronically Signed   By: Lowella Grip III M.D.   On: 12/15/2015 12:54     ASSESSMENT AND PLAN:   55 year old female with past medical history of COPD who presents to the hospital with cough, weight loss and shortness of breath.  1. Pneumonia-this is the cause of patient's shortness of breath cough. -Patient's chest x-ray was suggestive of a right middle lobe pneumonia.  -Continue IV Levaquin. Clinically improving. Follow blood, sputum cultures which are negative so far.  2. Lung mass-patient was noted to have a spiculated lung mass on the left upper lobe. -Appreciate pulmonary input and no plans for urgent bronchoscopy or CT-guided biopsy as it has risks. Possibly need for a PET scan as an outpatient and further follow-up.  3. Hyponatremia-hypovolemic versus SIADH given her lung disease.  -Improved with IV fluids  and will monitor.  4. Leukopenia-etiology unclear possibly related to underlying infectious  cause/alcohol abuse. -If counts do not improve patient likely needs to see hematology as an outpatient..  5. COPD-no acute exacerbation. - duonebs PRN.    All the records are reviewed and case discussed with Care Management/Social Workerr. Management plans discussed with the patient, family and they are in agreement.  CODE STATUS: Full  DVT Prophylaxis: Lovenox  TOTAL TIME TAKING CARE OF THIS PATIENT: 30 minutes.   POSSIBLE D/C IN 1-2 DAYS, DEPENDING ON CLINICAL CONDITION.   Henreitta Leber M.D on 12/16/2015 at 12:18 PM  Between 7am to 6pm - Pager - [phone]  After 6pm go to [email or web address] - password [email or web address] [facility]  [place] [facility]  Office  [phone]  CC: Primary care physician; No primary care provider on file.

## 2015-12-16 NOTE — Progress Notes (Signed)
Initial Nutrition Assessment  DOCUMENTATION CODES:   Severe malnutrition in context of acute illness/injury  INTERVENTION:  Monitor intake and cater to pt prefernce Recommend Ensure Enlive po BID, each supplement provides 350 kcal and 20 grams of protein but pt only like ensure original not ensure enlive and does not want at this time.  Pt frustrated and does not want any intervention at this time   NUTRITION DIAGNOSIS:   Inadequate oral intake related to poor appetite, cancer and cancer related treatments as evidenced by per patient/family report.    GOAL:   Patient will meet greater than or equal to 90% of their needs    MONITOR:   PO intake, Weight trends  REASON FOR ASSESSMENT:   Malnutrition Screening Tool    ASSESSMENT:   55 y/o female admitted with COPD, pneumonia and emphysema. Lung nodule found. Past Medical History  Diagnosis Date  . COPD (chronic obstructive pulmonary disease) ([place])   . Asthma     Pt drinking cookout milkshake during visit.  Reports intake has been down for the past week, eating bites at most. Frustrated at this visit  Medications reviewed Labs reviewed, K 2.9  Nutrition-Focused physical exam completed. Findings are moderate to severe fat depletion, moderate to severe muscle depletion, and none edema.     Diet Order:  Diet regular Room service appropriate?: Yes; Fluid consistency:: Thin  Skin:  Reviewed, no issues  Last BM:  4/15  Height:   Ht Readings from Last 1 Encounters:  12/15/15 '5\' 2"'$  (1.575 m)    Weight: Pt reports 10 pound wt loss in the last week and half (11% wt loss in the last week and half)  Wt Readings from Last 1 Encounters:  12/15/15 79 lb 11.2 oz (36.152 kg)    Ideal Body Weight:     BMI:  Body mass index is 14.57 kg/(m^2).  Estimated Nutritional Needs:   Kcal:  1610-9604 kcals/d (Using IBW of 50kg)  Protein:  50-60g/d  Fluid:  1.5-1.7 L/d  EDUCATION NEEDS:   No education needs identified  at this time  Shawn Carattini B. Zenia Resides, [place], [place] (pager) Weekend/On-Call pager [phone])

## 2015-12-17 DIAGNOSIS — E440 Moderate protein-calorie malnutrition: Secondary | ICD-10-CM | POA: Insufficient documentation

## 2015-12-17 DIAGNOSIS — E43 Unspecified severe protein-calorie malnutrition: Secondary | ICD-10-CM | POA: Insufficient documentation

## 2015-12-17 LAB — POTASSIUM: POTASSIUM: 3.6 mmol/L (ref 3.5–5.1)

## 2015-12-17 MED ORDER — ALBUTEROL SULFATE HFA 108 (90 BASE) MCG/ACT IN AERS: 2.0000 | INHALATION_SPRAY | Freq: Four times a day (QID) | RESPIRATORY_TRACT | Status: DC | PRN

## 2015-12-17 MED ORDER — GUAIFENESIN 100 MG/5ML PO SOLN: 5.0000 mL | ORAL | Status: DC | PRN

## 2015-12-17 MED ORDER — LEVOFLOXACIN 750 MG PO TABS: 750.0000 mg | ORAL_TABLET | Freq: Every day | ORAL | Status: DC

## 2015-12-17 MED ORDER — BUDESONIDE-FORMOTEROL FUMARATE 80-4.5 MCG/ACT IN AERO: 2.0000 | INHALATION_SPRAY | Freq: Two times a day (BID) | RESPIRATORY_TRACT | Status: DC

## 2015-12-17 NOTE — Progress Notes (Signed)
Lori Rangel was admitted to the Hospital on 12/15/2015 and Discharged  12/17/2015 and should be excused from work/school   for 10 days starting 12/15/2015 , may return to work/school without any restrictions.  Call Abel Presto MD, [facility]  [phone] with questions.  Henreitta Leber M.D on 12/17/2015,at 8:44 AM

## 2015-12-17 NOTE — Discharge Summary (Signed)
[place] at [place] NAME: Lori Rangel    MR#:  [id number]  DATE OF BIRTH:  March 15, 1961  DATE OF ADMISSION:  12/15/2015 ADMITTING PHYSICIAN: Henreitta Leber, MD  DATE OF DISCHARGE: 12/17/2015 10:58 AM  PRIMARY CARE PHYSICIAN: No primary care provider on file.    ADMISSION DIAGNOSIS:  Shortness of breath [R06.02] Community acquired pneumonia [J18.9] Sepsis due to pneumonia ([place]) [J18.9, A41.9]  DISCHARGE DIAGNOSIS:  Active Problems:   Pneumonia   Protein-calorie malnutrition, severe   SECONDARY DIAGNOSIS:   Past Medical History  Diagnosis Date  . COPD (chronic obstructive pulmonary disease) ([place])   . Asthma     HOSPITAL COURSE:   55 year old female with past medical history of COPD who presents to the hospital with cough, weight loss and shortness of breath.  1. Pneumonia-this was the cause of patient's shortness of breath cough. -Patient's chest x-ray was suggestive of a right middle lobe pneumonia.  -Patient was admitted to the hospital started on IV Levaquin, she has improved and now being discharged on oral Levaquin for another 7 days. Her cultures have been negative and she is clinically improved.  2. Lung mass-patient was noted to have a spiculated lung mass on the left upper lobe and CT chest on admission. -She was seen by pulmonary and will follow up with them as outpatient to be scheduled for a PET scan. The mass was not in good location for a bronchoscopy or CT-guided biopsy  3. Hyponatremia-hypovolemic versus SIADH given her lung disease.  -Has improved and now resolved..  4. Leukopenia-etiology unclear possibly related to underlying infectious cause/alcohol abuse. -If not improving as an outpatient she would benefit from a outpatient hematology evaluation.  5. COPD-no acute exacerbation. -Patient was discharged on Symbicort, albuterol inhaler as needed.  6. Hypokalemia-this was supplemented and has improved and  resolved now.  DISCHARGE CONDITIONS:   Stable  CONSULTS OBTAINED:     DRUG ALLERGIES:  No Known Allergies  DISCHARGE MEDICATIONS:   Discharge Medication List as of 12/17/2015  9:29 AM    START taking these medications   Details  albuterol (PROVENTIL HFA;VENTOLIN HFA) 108 (90 Base) MCG/ACT inhaler Inhale 2 puffs into the lungs every 6 (six) hours as needed for wheezing or shortness of breath., Starting 12/17/2015, Until Discontinued, Print    budesonide-formoterol (SYMBICORT) 80-4.5 MCG/ACT inhaler Inhale 2 puffs into the lungs 2 (two) times daily., Starting 12/17/2015, Until Discontinued, Print    levofloxacin (LEVAQUIN) 750 MG tablet Take 1 tablet (750 mg total) by mouth daily., Starting 12/17/2015, Until Discontinued, Print      CONTINUE these medications which have CHANGED   Details  guaiFENesin (ROBITUSSIN) 100 MG/5ML SOLN Take 5 mLs (100 mg total) by mouth every 4 (four) hours as needed for cough or to loosen phlegm., Starting 12/17/2015, Until Discontinued, Print         DISCHARGE INSTRUCTIONS:   DIET:  Regular diet  DISCHARGE CONDITION:  Stable  ACTIVITY:  Activity as tolerated  OXYGEN:  Home Oxygen: No.   Oxygen Delivery: room air  DISCHARGE LOCATION:  home   If you experience worsening of your admission symptoms, develop shortness of breath, life threatening emergency, suicidal or homicidal thoughts you must seek medical attention immediately by calling 911 or calling your MD immediately  if symptoms less severe.  You Must read complete instructions/literature along with all the possible adverse reactions/side effects for all the Medicines you take and that have been prescribed to you. Take  any new Medicines after you have completely understood and accpet all the possible adverse reactions/side effects.   Please note  You were cared for by a hospitalist during your hospital stay. If you have any questions about your discharge medications or the care you  received while you were in the hospital after you are discharged, you can call the unit and asked to speak with the hospitalist on call if the hospitalist that took care of you is not available. Once you are discharged, your primary care physician will handle any further medical issues. Please note that NO REFILLS for any discharge medications will be authorized once you are discharged, as it is imperative that you return to your primary care physician (or establish a relationship with a primary care physician if you do not have one) for your aftercare needs so that they can reassess your need for medications and monitor your lab values.     Today   Shortness of breath, cough much improved. No hemoptysis. Feels good enough to go home.  VITAL SIGNS:  Blood pressure 133/79, pulse 79, temperature 99.4 F (37.4 C), temperature source Oral, resp. rate 18, height '5\' 2"'$  (1.575 m), weight 36.152 kg (79 lb 11.2 oz), SpO2 99 %.  I/O:   Intake/Output Summary (Last 24 hours) at 12/17/15 1357 Last data filed at 12/17/15 0734  Gross per 24 hour  Intake 1396.5 ml  Output    500 ml  Net  896.5 ml    PHYSICAL EXAMINATION:  GENERAL:  55 y.o.-year-old cachectic patient lying in the bed with no acute distress.  EYES: Pupils equal, round, reactive to light and accommodation. No scleral icterus. Extraocular muscles intact.  HEENT: Head atraumatic, normocephalic. Oropharynx and nasopharynx clear.  NECK:  Supple, no jugular venous distention. No thyroid enlargement, no tenderness.  LUNGS: Prolonged inspiratory and expiratory phase, no wheezing, rales,rhonchi. No use of accessory muscles of respiration.  CARDIOVASCULAR: S1, S2 normal. No murmurs, rubs, or gallops.  ABDOMEN: Soft, non-tender, non-distended. Bowel sounds present. No organomegaly or mass.  EXTREMITIES: No pedal edema, cyanosis, or clubbing.  NEUROLOGIC: Cranial nerves II through XII are intact. No focal motor or sensory defecits b/l.   PSYCHIATRIC: The patient is alert and oriented x 3. Good affect.  SKIN: No obvious rash, lesion, or ulcer.   DATA REVIEW:   CBC  Recent Labs Lab 12/16/15 0436  WBC 1.1*  HGB 13.7  HCT 39.7  PLT 124*    Chemistries   Recent Labs Lab 12/15/15 1024 12/16/15 0436 12/17/15 1016  NA 127* 130*  --   K 3.5 2.9* 3.6  CL 94* 101  --   CO2 23 22  --   GLUCOSE 74 68  --   BUN <5* <5*  --   CREATININE 0.43* 0.48  --   CALCIUM 7.9* 7.3*  --   AST 226*  --   --   ALT 73*  --   --   ALKPHOS 139*  --   --   BILITOT 0.4  --   --     Cardiac Enzymes No results for input(s): TROPONINI in the last 168 hours.  Microbiology Results  Results for orders placed or performed during the hospital encounter of 12/15/15  Blood culture (routine x 2)     Status: None (Preliminary result)   Collection Time: 12/15/15 11:59 AM  Result Value Ref Range Status   Specimen Description BLOOD LEFT FOREARM  Final   Special Requests BOTTLES DRAWN AEROBIC AND ANAEROBIC  [place]  Final   Culture NO GROWTH 2 DAYS  Final   Report Status PENDING  Incomplete  Blood culture (routine x 2)     Status: None (Preliminary result)   Collection Time: 12/15/15 11:59 AM  Result Value Ref Range Status   Specimen Description BLOOD RIGHT ANTECUBITAL  Final   Special Requests BOTTLES DRAWN AEROBIC AND ANAEROBIC  [facility]  Final   Culture NO GROWTH 2 DAYS  Final   Report Status PENDING  Incomplete  Rapid Influenza A&B Antigens ([place] only)     Status: None   Collection Time: 12/15/15 11:59 AM  Result Value Ref Range Status   Influenza A ([facility]) NEGATIVE NEGATIVE Final   Influenza B ([facility]) NEGATIVE NEGATIVE Final    RADIOLOGY:  No results found.    Management plans discussed with the patient, family and they are in agreement.  CODE STATUS:     Code Status Orders        Start     Ordered   12/15/15 1448  Full code   Continuous     12/15/15 1447    Code Status History    Date Active Date Inactive Code Status  Order ID Comments User Context   This patient has a current code status but no historical code status.      TOTAL TIME TAKING CARE OF THIS PATIENT: 40 minutes.    Henreitta Leber M.D on 12/17/2015 at 1:57 PM  Between 7am to 6pm - Pager - [phone]  After 6pm go to [email or web address] - password [email or web address] [facility]  [place] [facility]  Office  [phone]  CC: Primary care physician; No primary care provider on file.

## 2015-12-18 ENCOUNTER — Telehealth: Payer: Self-pay | Admitting: Internal Medicine

## 2015-12-18 ENCOUNTER — Telehealth: Payer: Self-pay | Admitting: *Deleted

## 2015-12-18 NOTE — Telephone Encounter (Signed)
Daughter called and states pt was given Levaquin at d/c for 7 more days. States pt is having stomach pain and states she will not take it any longer. Pt was getting Levaquin IV in hospital for PNA. DR seen pt in hospital but has not been seen yet in clinic. Please advise. NKDA

## 2015-12-18 NOTE — Telephone Encounter (Signed)
Pt daughter calling stating pt was seen in hospital and was prescribed Levaquin  Not sure if patient should still be on this. She is having some issues with taking that, pains in her stomach.  Please advise

## 2015-12-18 NOTE — Telephone Encounter (Signed)
-----   Message from Laverle Hobby, MD sent at 12/15/2015  4:59 PM EDT ----- Regarding: HFU Pt needs PFT and follow up with me next week (overbook OK).

## 2015-12-18 NOTE — Telephone Encounter (Signed)
See below

## 2015-12-18 NOTE — Telephone Encounter (Signed)
appts scheduled. Nothing further needed.

## 2015-12-19 ENCOUNTER — Ambulatory Visit (INDEPENDENT_AMBULATORY_CARE_PROVIDER_SITE_OTHER): Payer: BLUE CROSS/BLUE SHIELD | Admitting: *Deleted

## 2015-12-19 DIAGNOSIS — R0600 Dyspnea, unspecified: Secondary | ICD-10-CM

## 2015-12-19 LAB — PULMONARY FUNCTION TEST
DL/VA % PRED: 73 %
DL/VA: 3.33 ml/min/mmHg/L
DLCO unc % pred: 54 %
DLCO unc: 11.69 ml/min/mmHg
FEF 25-75 POST: 0.48 L/s
FEF 25-75 PRE: 0.44 L/s
FEF2575-%CHANGE-POST: 8 %
FEF2575-%PRED-PRE: 18 %
FEF2575-%Pred-Post: 19 %
FEV1-%Change-Post: 4 %
FEV1-%PRED-POST: 36 %
FEV1-%PRED-PRE: 34 %
FEV1-PRE: 0.86 L
FEV1-Post: 0.9 L
FEV1FVC-%CHANGE-POST: -1 %
FEV1FVC-%PRED-PRE: 55 %
FEV6-%Change-Post: 4 %
FEV6-%PRED-PRE: 64 %
FEV6-%Pred-Post: 67 %
FEV6-PRE: 1.97 L
FEV6-Post: 2.05 L
FEV6FVC-%Change-Post: -1 %
FEV6FVC-%Pred-Post: 100 %
FEV6FVC-%Pred-Pre: 102 %
FVC-%CHANGE-POST: 6 %
FVC-%PRED-PRE: 62 %
FVC-%Pred-Post: 66 %
FVC-POST: 2.1 L
FVC-PRE: 1.98 L
PRE FEV1/FVC RATIO: 44 %
Post FEV1/FVC ratio: 43 %
Post FEV6/FVC ratio: 98 %
Pre FEV6/FVC Ratio: 100 %

## 2015-12-19 NOTE — Telephone Encounter (Signed)
Per DS pt to stop Levaquin till her f/u on 12/21/15.  Spoke with daughter and she states pt told her her that after she took the Levaquin she got muscle aches, stomach upset and dizziness. Pt's daughter informed for pt to hold Levaquin until her f/u on 12/21/15. Nothing further needed.

## 2015-12-19 NOTE — Progress Notes (Signed)
PFT performed today with nitrogen washout. 

## 2015-12-20 LAB — CULTURE, BLOOD (ROUTINE X 2)
CULTURE: NO GROWTH
Culture: NO GROWTH

## 2015-12-21 ENCOUNTER — Ambulatory Visit (INDEPENDENT_AMBULATORY_CARE_PROVIDER_SITE_OTHER): Payer: BLUE CROSS/BLUE SHIELD | Admitting: Internal Medicine

## 2015-12-21 ENCOUNTER — Encounter: Payer: Self-pay | Admitting: Internal Medicine

## 2015-12-21 VITALS — BP 132/74 | HR 89 | Ht 62.0 in | Wt 78.4 lb

## 2015-12-21 DIAGNOSIS — R0600 Dyspnea, unspecified: Secondary | ICD-10-CM | POA: Diagnosis not present

## 2015-12-21 MED ORDER — BUDESONIDE-FORMOTEROL FUMARATE 160-4.5 MCG/ACT IN AERO: 2.0000 | INHALATION_SPRAY | Freq: Two times a day (BID) | RESPIRATORY_TRACT | Status: DC

## 2015-12-21 NOTE — Progress Notes (Signed)
* [name]     Assessment and Plan:  Lung nodule. -1.4 cm lung nodule, left upper lobe. Intermediate risk of lung cancer. -I discussed with the patient as well as the patient's family in the room, various options including biopsy, surveillance with imaging, no follow-up. -Given her relatively young age, and good functional status, we opted to attempt to obtain a tissue biopsy. The patient will be referred for a navigational bronchoscopic biopsy, to be performed by Dr. Mortimer Fries -The patient will need to be off of work until after the procedure.  Emphysema. -Patient has emphysematous changes in both lungs seen on CT chest. -Will start symbicort, the patient had a prescription for this, but it was too expensive, she is given a co-pay card, and asked to try to get the prescription. If not, she is asked to contact us and we will try different prescription. -I reviewed the results of the patient's Pulmicort function test today, she was informed that her PFT showed an FEV1 of 34%, which is consistent with severe emphysema.  Pneumonia. -Her pneumonia appears resolved, her breathing appears improved. No need for further antibiotics.  Nicotine Abuse.   -We discussed the importance of smoking cessation, and that continued smoking will increase her risk of, patient's with any lung biopsy. -She is asked to stop smoking, 1 week before the bronchoscopy.   Date: 12/21/2015  MRN# [id number] Lori Rangel March 10, 1961   Lori Rangel is a 55 y.o. old female seen in follow up for chief complaint of  Chief Complaint  Patient presents with  . Hospitalization Follow-up    pt states breathing has improved since hosp. occ prod cough clear in color & wheezing.     HPI:   The patient comes in today as a hospital follow up, she was recently seen in the hospital for a pneumonia/COPD exacerbation. At that time it was noted that the patient had a lung nodule. Review of her CT at that  time also showed significant emphysema.  She is not on symicort currently due to cost. She is no longer on the abx due to nausea. She only takes ventolin inhaler 2 puffs twice per day. She feels that it helps.  She is currently smoking about half a pack per day or less. She is continuing to lose weight.   View of the patient's PFT tracings: Spirometry FEV1 is 2.47 L which is 34% of predicted, there is no improvement with bronchodilator therapy. FEV1 to FVC ratio is 44, FVC is 62% of predicted. Lung volumes: Residual volume is 115% of predicted, TLC is 86% of predicted, RV/TLC ratio is elevated at 133, consistent with air trapping. Diffusion capacity: Diffusion capacity is reduced at 54% of predicted. 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 There is also severely reduced diffusion capacity and air trapping consistent with a diagnosis of emphysema.  Medication:   Outpatient Encounter Prescriptions as of 12/21/2015  Medication Sig  . albuterol (PROVENTIL HFA;VENTOLIN HFA) 108 (90 Base) MCG/ACT inhaler Inhale 2 puffs into the lungs every 6 (six) hours as needed for wheezing or shortness of breath.  . budesonide-formoterol (SYMBICORT) 80-4.5 MCG/ACT inhaler Inhale 2 puffs into the lungs 2 (two) times daily.  Marland Kitchen guaiFENesin (ROBITUSSIN) 100 MG/5ML SOLN Take 5 mLs (100 mg total) by mouth every 4 (four) hours as needed for cough or to loosen phlegm.  Marland Kitchen levofloxacin (LEVAQUIN) 750 MG tablet Take 1 tablet (750  mg total) by mouth daily.   No facility-administered encounter medications on file as of 12/21/2015.     Allergies:  Review of patient's allergies indicates no known allergies.  Review of Systems: Gen:  Denies  fever, sweats. HEENT: Denies blurred vision. Cvc:  No dizziness, chest pain or heaviness Resp:   Denies cough or sputum porduction. Gi: Denies swallowing difficulty, stomach pain. constipation, bowel  incontinence Gu:  Denies bladder incontinence, burning urine Ext:   No Joint pain, stiffness. Skin: No skin rash, easy bruising. Endoc:  No polyuria, polydipsia. Psych: No depression, insomnia. Other:  All other systems were reviewed and found to be negative other than what is mentioned in the HPI.   Physical Examination:   VS: BP 132/74 mmHg  Pulse 89  Ht '5\' 2"'$  (1.575 m)  Wt 78 lb 6.4 oz (35.562 kg)  BMI 14.34 kg/m2  SpO2 94%  General Appearance: No distress  Neuro:without focal findings,  speech normal,  HEENT: PERRLA, EOM intact. Pulmonary: normal breath sounds, No wheezing. Decreased air entry bilaterally.   CardiovascularNormal S1,S2.  No m/r/g.   Abdomen: Benign, Soft, non-tender. Renal:  No costovertebral tenderness  GU:  Not performed at this time. Endoc: No evident thyromegaly, no signs of acromegaly. Skin:   warm, no rash. Extremities: normal, no cyanosis, clubbing.   LABORATORY PANEL:   CBC  Recent Labs Lab 12/16/15 0436  WBC 1.1*  HGB 13.7  HCT 39.7  PLT 124*   ------------------------------------------------------------------------------------------------------------------  Chemistries   Recent Labs Lab 12/15/15 1024 12/16/15 0436 12/17/15 1016  NA 127* 130*  --   K 3.5 2.9* 3.6  CL 94* 101  --   CO2 23 22  --   GLUCOSE 74 68  --   BUN <5* <5*  --   CREATININE 0.43* 0.48  --   CALCIUM 7.9* 7.3*  --   AST 226*  --   --   ALT 73*  --   --   ALKPHOS 139*  --   --   BILITOT 0.4  --   --    ------------------------------------------------------------------------------------------------------------------  Cardiac Enzymes No results for input(s): TROPONINI in the last 168 hours. ------------------------------------------------------------  RADIOLOGY:   No results found for this or any previous visit. Results for orders placed during the hospital encounter of 12/15/15  DG Chest 2 View   Narrative CLINICAL DATA:  Cough, chest congestion  for the past week; history of COPD and previous episodes of pneumonia and bronchitis, current smoker.  EXAM: CHEST  2 VIEW  COMPARISON:  None in PACs  FINDINGS: The lungs are hyperinflated with mild hemidiaphragm flattening and increased AP dimension of the thorax. There is atelectasis of a portion of the right middle lobe. There is no alveolar infiltrate or pleural effusion. There is subtle nodular density in the left suprahilar region. The trachea is midline. The heart and pulmonary vascularity are normal. There is calcification in the wall of the descending thoracic aorta. The bony thorax exhibits no acute abnormality.  IMPRESSION: COPD. Atelectasis or early pneumonia in the right middle lobe. Subtle nodular density in the left suprahilar region likely anteriorly in the inferior aspect of the upper lobe.  At minimum followup PA and lateral chest X-ray is recommended in 3-4 weeks following trial of antibiotic therapy to ensure resolution and exclude underlying malignancy. However given the possibly postobstructive findings in the right middle lobe and the subtle nodule in the left suprahilar region, chest CT scanning now would be a useful next imaging  step to exclude a centrally obstructing lesion and/or abnormal parenchymal mass.   Electronically Signed   By: David  Martinique M.D.   On: 12/15/2015 11:26    ------------------------------------------------------------------------------------------------------------------  Thank  you for allowing [facility] to assist in the care of your patient. Our recommendations are noted above.  Please contact us if we can be of further service.   Marda Stalker, MD.  [facility] Office Number: [phone]  Patricia Pesa, M.D.  Vilinda Boehringer, M.D.  Merton Border, M.D  12/21/2015

## 2015-12-21 NOTE — Patient Instructions (Signed)
-  Use Symbicort 2 puffs twice daily, make sure to rinse her mouth after each use.  -We will set up for a bronchoscopic biopsy to be performed by Dr. Mortimer Fries  -Smoking increases the risk of complications from any procedure and will make her lung function continued to decrease.  -Follow-up proxy 1 week after her biopsy.

## 2015-12-26 ENCOUNTER — Ambulatory Visit: Payer: BLUE CROSS/BLUE SHIELD | Admitting: Cardiovascular Disease

## 2015-12-26 ENCOUNTER — Encounter
Admission: RE | Admit: 2015-12-26 | Discharge: 2015-12-26 | Disposition: A | Discharge status: Home / Self Care | Payer: BLUE CROSS/BLUE SHIELD | Source: Ambulatory Visit | Attending: Internal Medicine | Admitting: Internal Medicine

## 2015-12-26 DIAGNOSIS — F17200 Nicotine dependence, unspecified, uncomplicated: Secondary | ICD-10-CM | POA: Diagnosis not present

## 2015-12-26 DIAGNOSIS — Z01818 Encounter for other preprocedural examination: Secondary | ICD-10-CM | POA: Diagnosis present

## 2015-12-26 DIAGNOSIS — Z7951 Long term (current) use of inhaled steroids: Secondary | ICD-10-CM | POA: Diagnosis not present

## 2015-12-26 DIAGNOSIS — R911 Solitary pulmonary nodule: Secondary | ICD-10-CM | POA: Diagnosis not present

## 2015-12-26 DIAGNOSIS — J441 Chronic obstructive pulmonary disease with (acute) exacerbation: Secondary | ICD-10-CM | POA: Diagnosis not present

## 2015-12-26 DIAGNOSIS — Z79899 Other long term (current) drug therapy: Secondary | ICD-10-CM | POA: Diagnosis not present

## 2015-12-26 HISTORY — DX: Abnormal weight loss: R63.4

## 2015-12-26 HISTORY — DX: Reserved for inherently not codable concepts without codable children: IMO0001

## 2015-12-26 LAB — BASIC METABOLIC PANEL
ANION GAP: 13 (ref 5–15)
BUN: <5 mg/dL — ABNORMAL LOW (ref 6–20)
CHLORIDE: 94 mmol/L — AB (ref 101–111)
CO2: 24 mmol/L (ref 22–32)
Calcium: 8.5 mg/dL — ABNORMAL LOW (ref 8.9–10.3)
Creatinine, Ser: 0.45 mg/dL (ref 0.44–1.00)
GFR calc non Af Amer: >60 mL/min (ref >60)
Glucose, Bld: 72 mg/dL (ref 65–99)
POTASSIUM: 3.5 mmol/L (ref 3.5–5.1)
SODIUM: 131 mmol/L — AB (ref 135–145)

## 2015-12-26 NOTE — Patient Instructions (Signed)
  Your procedure is scheduled on: 01/02/16 Report to Day Surgery. [facility] second floor To find out your arrival time please call [phone] between 1PM - 3PM on 01/01/16  Remember: Instructions that are not followed completely may result in serious medical risk, up to and including death, or upon the discretion of your surgeon and anesthesiologist your surgery may need to be rescheduled.    __x__ 1. Do not eat food or drink liquids after midnight. No gum chewing or hard candies.     __x__ 2. No Alcohol for 24 hours before or after surgery.   ____ 3. Bring all medications with you on the day of surgery if instructed.    __x__ 4. Notify your doctor if there is any change in your medical condition     (cold, fever, infections).     Do not wear jewelry, make-up, hairpins, clips or nail polish.  Do not wear lotions, powders, or perfumes. You may wear deodorant.  Do not shave 48 hours prior to surgery. Men may shave face and neck.  Do not bring valuables to the hospital.    [facility] is not responsible for any belongings or valuables.               Contacts, dentures or bridgework may not be worn into surgery.  Leave your suitcase in the car. After surgery it may be brought to your room.  For patients admitted to the hospital, discharge time is determined by your                treatment team.   Patients discharged the day of surgery will not be allowed to drive home.   Please read over the following fact sheets that you were given:   Surgical Site Infection Prevention   ____ Take these medicines the morning of surgery with A SIP OF WATER:    1. none  2.   3.   4.  5.  6.  ____ Fleet Enema (as directed)   ____ Use CHG Soap as directed  _x___ Use inhalers on the day of surgery  ____ Stop metformin 2 days prior to surgery    ____ Take 1/2 of usual insulin dose the night before surgery and none on the morning of surgery.   ____ Stop Coumadin/Plavix/aspirin on ____ Stop  Anti-inflammatories on    ____ Stop supplements until after surgery.    ____ Bring C-Pap to the hospital.

## 2015-12-26 NOTE — Pre-Procedure Instructions (Signed)
Medical clearance request/ekg as instructed by dr Amie Critchley called and faxed to misty at dr Mortimer Fries. Patient aware

## 2015-12-29 ENCOUNTER — Encounter: Payer: Self-pay | Admitting: Cardiovascular Disease

## 2015-12-29 ENCOUNTER — Ambulatory Visit (INDEPENDENT_AMBULATORY_CARE_PROVIDER_SITE_OTHER): Payer: BLUE CROSS/BLUE SHIELD | Admitting: Cardiovascular Disease

## 2015-12-29 VITALS — BP 98/60 | HR 86 | Ht 62.0 in | Wt 78.0 lb

## 2015-12-29 DIAGNOSIS — Z01818 Encounter for other preprocedural examination: Secondary | ICD-10-CM

## 2015-12-29 DIAGNOSIS — I739 Peripheral vascular disease, unspecified: Secondary | ICD-10-CM | POA: Diagnosis not present

## 2015-12-29 DIAGNOSIS — Z720 Tobacco use: Secondary | ICD-10-CM | POA: Diagnosis not present

## 2015-12-29 DIAGNOSIS — I6523 Occlusion and stenosis of bilateral carotid arteries: Secondary | ICD-10-CM | POA: Diagnosis not present

## 2015-12-29 DIAGNOSIS — R911 Solitary pulmonary nodule: Secondary | ICD-10-CM

## 2015-12-29 DIAGNOSIS — F17200 Nicotine dependence, unspecified, uncomplicated: Secondary | ICD-10-CM

## 2015-12-29 MED ORDER — VARENICLINE TARTRATE 1 MG PO TABS: 1.0000 mg | ORAL_TABLET | Freq: Two times a day (BID) | ORAL | Status: DC

## 2015-12-29 NOTE — Patient Instructions (Signed)
You are doing well.  Please start chantix 1/2 pill slowly, up to twice a day Then slowly increase up to 1 pill twice a day OK to keep smoking while on chantix  Please call us if you have new issues that need to be addressed before your next appt.

## 2015-12-31 DIAGNOSIS — F17219 Nicotine dependence, cigarettes, with unspecified nicotine-induced disorders: Secondary | ICD-10-CM | POA: Insufficient documentation

## 2015-12-31 DIAGNOSIS — F17200 Nicotine dependence, unspecified, uncomplicated: Secondary | ICD-10-CM | POA: Insufficient documentation

## 2015-12-31 DIAGNOSIS — Z87891 Personal history of nicotine dependence: Secondary | ICD-10-CM | POA: Insufficient documentation

## 2015-12-31 DIAGNOSIS — R911 Solitary pulmonary nodule: Secondary | ICD-10-CM | POA: Insufficient documentation

## 2015-12-31 DIAGNOSIS — I739 Peripheral vascular disease, unspecified: Secondary | ICD-10-CM | POA: Insufficient documentation

## 2015-12-31 DIAGNOSIS — Z01818 Encounter for other preprocedural examination: Secondary | ICD-10-CM | POA: Insufficient documentation

## 2015-12-31 DIAGNOSIS — I6529 Occlusion and stenosis of unspecified carotid artery: Secondary | ICD-10-CM | POA: Insufficient documentation

## 2015-12-31 NOTE — Assessment & Plan Note (Addendum)
1.4 cm  left upper lobe nodule Scheduled for bronchoscopy next week with Dr. Mortimer Fries   Total encounter time more than 45 minutes  Greater than 50% was spent in counseling and coordination of care with the patient

## 2015-12-31 NOTE — Assessment & Plan Note (Signed)
Generous amount of carotid atherosclerosis noted at the level of the mandible, At a later date, we'll recommend designated carotid ultrasound

## 2015-12-31 NOTE — Assessment & Plan Note (Signed)
Long discussion concerning her PAD seen on CT scan. After seeing the images, she is more determined to quit smoking Images also shown to her daughter in the clinic today

## 2015-12-31 NOTE — Assessment & Plan Note (Signed)
We have encouraged her to continue to work on weaning her cigarettes and smoking cessation. She has accepted a prescription for Chantix, details discussed with her in detail. More than 5 minutes spent discussing smoking cessation techniques

## 2015-12-31 NOTE — Progress Notes (Signed)
Patient ID: Lori Rangel, female    DOB: Jan 29, 1961, 55 y.o.   MRN: [id number]  HPI Comments: Lori Rangel is a 55 year old woman with long history of smoking who continues to smoke, prior history of pneumonia (on the right), CT scan as part of workup showing 1.4 cm nodule left upper lobe who presents by referral from Dr. Mortimer Fries for preoperative cardiac evaluation for bronchoscopy procedure next week.  She reports that she has recovered from her pneumonia, currently on several inhalers including Symbicort, albuterol. She denies any prior cardiac history, denies any chest pain. She does have chronic mild shortness of breath on exertion which she attributes to her long history of smoking. She has not tried very hard to quit smoking, has not tried various modalities  Otherwise is active with no complaints.  CT scan pulled up in the office with her today showing 1.4 cm nodule left upper lobe,  she has seen the images with her daughter We also reviewed cardiac findings. There is no significant coronary calcifications. In fact appears to have quite large coronary arteries. There is mild-to-moderate diffuse carotid and aorta atherosclerosis, shown to her in detail  EKG on today's visit shows normal sinus rhythm with rate 86 bpm, no significant ST or T-wave changes   Allergies  Allergen Reactions  . Levaquin [Levofloxacin] Nausea Only    Cramps & Body aches    Current Outpatient Prescriptions on File Prior to Visit  Medication Sig Dispense Refill  . albuterol (PROVENTIL HFA;VENTOLIN HFA) 108 (90 Base) MCG/ACT inhaler Inhale 2 puffs into the lungs every 6 (six) hours as needed for wheezing or shortness of breath. 1 Inhaler 2  . budesonide-formoterol (SYMBICORT) 160-4.5 MCG/ACT inhaler Inhale 2 puffs into the lungs 2 (two) times daily. 1 Inhaler 0  . guaiFENesin (ROBITUSSIN) 100 MG/5ML SOLN Take 5 mLs (100 mg total) by mouth every 4 (four) hours as needed for cough or to loosen phlegm. 236 mL 0    No current facility-administered medications on file prior to visit.    Past Medical History  Diagnosis Date  . COPD (chronic obstructive pulmonary disease) ([place])   . Asthma   . Shortness of breath dyspnea   . Weight decrease major recent weight loss    Past Surgical History  Procedure Laterality Date  . Breast biopsy      Social History  reports that she has been smoking Cigarettes.  She has a 30 pack-year smoking history. She has never used smokeless tobacco. She reports that she drinks alcohol. She reports that she does not use illicit drugs.  Family History family history includes Cirrhosis in her father.   Review of Systems  Constitutional: Negative.   Respiratory: Negative.   Cardiovascular: Negative.   Gastrointestinal: Negative.   Endocrine: Negative.   Musculoskeletal: Negative.   Neurological: Negative.   Hematological: Negative.   Psychiatric/Behavioral: Negative.   All other systems reviewed and are negative.   BP 98/60 mmHg  Pulse 86  Ht '5\' 2"'$  (1.575 m)  Wt 78 lb (35.381 kg)  BMI 14.26 kg/m2  Physical Exam  Constitutional: She is oriented to person, place, and time. She appears well-developed and well-nourished.  HENT:  Head: Normocephalic.  Nose: Nose normal.  Mouth/Throat: Oropharynx is clear and moist.  Eyes: Conjunctivae are normal. Pupils are equal, round, and reactive to light.  Neck: Normal range of motion. Neck supple. No JVD present.  Cardiovascular: Normal rate, regular rhythm, normal heart sounds and intact distal pulses.  Exam reveals no  gallop and no friction rub.   No murmur heard. Pulmonary/Chest: Effort normal. No respiratory distress. She has decreased breath sounds. She has no wheezes. She has no rales. She exhibits no tenderness.  Abdominal: Soft. Bowel sounds are normal. She exhibits no distension. There is no tenderness.  Musculoskeletal: Normal range of motion. She exhibits no edema or tenderness.  Lymphadenopathy:    She  has no cervical adenopathy.  Neurological: She is alert and oriented to person, place, and time. Coordination normal.  Skin: Skin is warm and dry. No rash noted. No erythema.  Psychiatric: She has a normal mood and affect. Her behavior is normal. Judgment and thought content normal.

## 2015-12-31 NOTE — Assessment & Plan Note (Signed)
Acceptable risk for upcoming bronchoscopy even lung resection if needed. CT of the chest showing no significant coronary calcifications, large vessels. She does have carotid and aorta atherosclerosis, mild to moderate. No further testing needed.

## 2016-01-01 ENCOUNTER — Telehealth: Payer: Self-pay | Admitting: Internal Medicine

## 2016-01-01 NOTE — Pre-Procedure Instructions (Signed)
Cleared by dr Rockey Situ 12/29/15 low risk

## 2016-01-01 NOTE — Telephone Encounter (Signed)
Spoke with daughter who states pt was told by pre-admit that pt needed to arrive at 12:45pm for procedure. Called Leah in OR to confirm time had not been changed. Told daughter to have pt at [facility] by 12:30pm. Nothing further needed.

## 2016-01-01 NOTE — Telephone Encounter (Signed)
Patient daughter Juliann Pulse wants to clarify what time to come in for biopsy tomorrow.

## 2016-01-02 ENCOUNTER — Ambulatory Visit: Payer: BLUE CROSS/BLUE SHIELD | Admitting: Anesthesiology

## 2016-01-02 ENCOUNTER — Ambulatory Visit: Payer: BLUE CROSS/BLUE SHIELD

## 2016-01-02 ENCOUNTER — Encounter
Admission: RE | Disposition: A | Discharge status: Home / Self Care | Payer: Self-pay | Source: Ambulatory Visit | Attending: Internal Medicine

## 2016-01-02 ENCOUNTER — Ambulatory Visit
Admission: RE | Admit: 2016-01-02 | Discharge: 2016-01-02 | Disposition: A | Discharge status: Home / Self Care | Payer: BLUE CROSS/BLUE SHIELD | Source: Ambulatory Visit | Attending: Internal Medicine | Admitting: Internal Medicine

## 2016-01-02 ENCOUNTER — Encounter: Payer: Self-pay | Admitting: *Deleted

## 2016-01-02 DIAGNOSIS — R911 Solitary pulmonary nodule: Secondary | ICD-10-CM | POA: Diagnosis not present

## 2016-01-02 DIAGNOSIS — Z7951 Long term (current) use of inhaled steroids: Secondary | ICD-10-CM | POA: Insufficient documentation

## 2016-01-02 DIAGNOSIS — R918 Other nonspecific abnormal finding of lung field: Secondary | ICD-10-CM | POA: Diagnosis not present

## 2016-01-02 DIAGNOSIS — F17200 Nicotine dependence, unspecified, uncomplicated: Secondary | ICD-10-CM | POA: Insufficient documentation

## 2016-01-02 DIAGNOSIS — Z79899 Other long term (current) drug therapy: Secondary | ICD-10-CM | POA: Insufficient documentation

## 2016-01-02 DIAGNOSIS — J441 Chronic obstructive pulmonary disease with (acute) exacerbation: Secondary | ICD-10-CM | POA: Insufficient documentation

## 2016-01-02 HISTORY — PX: ELECTROMAGNETIC NAVIGATION BROCHOSCOPY: SHX5369

## 2016-01-02 SURGERY — ELECTROMAGNETIC NAVIGATION BRONCHOSCOPY
Anesthesia: General

## 2016-01-02 MED ORDER — SUCCINYLCHOLINE CHLORIDE 20 MG/ML IJ SOLN
INTRAMUSCULAR | Status: DC | PRN
  Administered 2016-01-02: 40 mg via INTRAVENOUS

## 2016-01-02 MED ORDER — ONDANSETRON HCL 4 MG/2ML IJ SOLN
INTRAMUSCULAR | Status: DC | PRN
  Administered 2016-01-02: 4 mg via INTRAVENOUS

## 2016-01-02 MED ORDER — FENTANYL CITRATE (PF) 100 MCG/2ML IJ SOLN
INTRAMUSCULAR | Status: AC
  Filled 2016-01-02: qty 2

## 2016-01-02 MED ORDER — IPRATROPIUM-ALBUTEROL 0.5-2.5 (3) MG/3ML IN SOLN: 3.0000 mL | Freq: Four times a day (QID) | RESPIRATORY_TRACT | Status: DC

## 2016-01-02 MED ORDER — FENTANYL CITRATE (PF) 100 MCG/2ML IJ SOLN
INTRAMUSCULAR | Status: DC | PRN
  Administered 2016-01-02 (×2): 25 ug via INTRAVENOUS

## 2016-01-02 MED ORDER — IPRATROPIUM-ALBUTEROL 0.5-2.5 (3) MG/3ML IN SOLN
3.0000 mL | Freq: Once | RESPIRATORY_TRACT | Status: AC
  Administered 2016-01-02: 3 mL via RESPIRATORY_TRACT

## 2016-01-02 MED ORDER — ONDANSETRON HCL 4 MG/2ML IJ SOLN
4.0000 mg | Freq: Once | INTRAMUSCULAR | Status: AC
  Administered 2016-01-02: 4 mg via INTRAVENOUS

## 2016-01-02 MED ORDER — FAMOTIDINE 20 MG PO TABS
20.0000 mg | ORAL_TABLET | Freq: Once | ORAL | Status: AC
  Administered 2016-01-02: 20 mg via ORAL

## 2016-01-02 MED ORDER — ROCURONIUM BROMIDE 100 MG/10ML IV SOLN
INTRAVENOUS | Status: DC | PRN
  Administered 2016-01-02: 10 mg via INTRAVENOUS

## 2016-01-02 MED ORDER — IPRATROPIUM-ALBUTEROL 0.5-2.5 (3) MG/3ML IN SOLN
RESPIRATORY_TRACT | Status: AC
  Filled 2016-01-02: qty 3

## 2016-01-02 MED ORDER — FAMOTIDINE 20 MG PO TABS
ORAL_TABLET | ORAL | Status: AC
  Administered 2016-01-02: 20 mg via ORAL
  Filled 2016-01-02: qty 1

## 2016-01-02 MED ORDER — GLYCOPYRROLATE 0.2 MG/ML IJ SOLN
INTRAMUSCULAR | Status: DC | PRN
  Administered 2016-01-02: 0.2 mg via INTRAVENOUS

## 2016-01-02 MED ORDER — LIDOCAINE HCL (CARDIAC) 20 MG/ML IV SOLN
INTRAVENOUS | Status: DC | PRN
  Administered 2016-01-02: 40 mg via INTRAVENOUS

## 2016-01-02 MED ORDER — LACTATED RINGERS IV SOLN
INTRAVENOUS | Status: DC
  Administered 2016-01-02: 13:00:00 via INTRAVENOUS

## 2016-01-02 MED ORDER — IPRATROPIUM-ALBUTEROL 0.5-2.5 (3) MG/3ML IN SOLN
RESPIRATORY_TRACT | Status: AC
  Administered 2016-01-02: 3 mL via RESPIRATORY_TRACT
  Filled 2016-01-02: qty 3

## 2016-01-02 MED ORDER — IPRATROPIUM-ALBUTEROL 0.5-2.5 (3) MG/3ML IN SOLN
3.0000 mL | Freq: Once | RESPIRATORY_TRACT | Status: DC | PRN
  Administered 2016-01-02 (×2): 3 mL via RESPIRATORY_TRACT

## 2016-01-02 MED ORDER — FENTANYL CITRATE (PF) 100 MCG/2ML IJ SOLN
25.0000 ug | INTRAMUSCULAR | Status: DC | PRN
  Administered 2016-01-02 (×3): 25 ug via INTRAVENOUS

## 2016-01-02 MED ORDER — NEOSTIGMINE METHYLSULFATE 10 MG/10ML IV SOLN
INTRAVENOUS | Status: DC | PRN
  Administered 2016-01-02: 1 mg via INTRAVENOUS

## 2016-01-02 MED ORDER — ONDANSETRON HCL 4 MG/2ML IJ SOLN
INTRAMUSCULAR | Status: AC
  Administered 2016-01-02: 4 mg via INTRAVENOUS
  Filled 2016-01-02: qty 2

## 2016-01-02 MED ORDER — PROPOFOL 10 MG/ML IV BOLUS
INTRAVENOUS | Status: DC | PRN
  Administered 2016-01-02: 30 mg via INTRAVENOUS
  Administered 2016-01-02: 70 mg via INTRAVENOUS
  Administered 2016-01-02: 30 mg via INTRAVENOUS

## 2016-01-02 NOTE — Interval H&P Note (Signed)
History and Physical Interval Note:  01/02/2016 1:13 PM  [name]  has presented today for surgery, with the diagnosis of LUNG MASS  The various methods of treatment have been discussed with the patient and family. After consideration of risks, benefits and other options for treatment, the patient has consented to  Procedure(s): ELECTROMAGNETIC NAVIGATION BRONCHOSCOPY (N/A) as a surgical intervention .  [name] history has been reviewed, patient examined, no change in status, stable for surgery.  I have reviewed the patient's chart and labs.  Questions were answered to the patient's satisfaction.     Lori Rangel

## 2016-01-02 NOTE — Anesthesia Preprocedure Evaluation (Signed)
Anesthesia Evaluation  Patient identified by MRN, date of birth, ID band Patient awake    Reviewed: Allergy & Precautions, H&P , NPO status , Patient's Chart, lab work & pertinent test results  History of Anesthesia Complications Negative for: history of anesthetic complications  Airway Mallampati: III  TM Distance: <3 FB Neck ROM: limited  Mouth opening: Limited Mouth Opening  Dental  (+) Poor Dentition, Chipped, Missing, Edentulous Lower   Pulmonary shortness of breath and with exertion, asthma , pneumonia, resolved, COPD,  COPD inhaler, Current Smoker,    Pulmonary exam normal breath sounds clear to auscultation       Cardiovascular Exercise Tolerance: Poor (-) angina+ Peripheral Vascular Disease  (-) Past MI Normal cardiovascular exam Rhythm:regular Rate:Normal     Neuro/Psych negative neurological ROS  negative psych ROS   GI/Hepatic negative GI ROS, Neg liver ROS,   Endo/Other  negative endocrine ROS  Renal/GU negative Renal ROS  negative genitourinary   Musculoskeletal   Abdominal   Peds  Hematology negative hematology ROS (+)   Anesthesia Other Findings Past Medical History:   COPD (chronic obstructive pulmonary disease) (*              Asthma                                                       Shortness of breath dyspnea                                  Weight decrease                                 major rec*  Past Surgical History:   BREAST BIOPSY                                   Right              COLONOSCOPY                                                  BMI    Body Mass Index   14.26 kg/m 2      Reproductive/Obstetrics negative OB ROS                             Anesthesia Physical Anesthesia Plan  ASA: III  Anesthesia Plan: General ETT   Post-op Pain Management:    Induction:   Airway Management Planned:   Additional Equipment:   Intra-op Plan:    Post-operative Plan:   Informed Consent: I have reviewed the patients History and Physical, chart, labs and discussed the procedure including the risks, benefits and alternatives for the proposed anesthesia with the patient or authorized representative who has indicated his/her understanding and acceptance.   Dental Advisory Given  Plan Discussed with: Anesthesiologist, CRNA and Surgeon  Anesthesia Plan Comments: (Patient informed that they are higher risk for complications from anesthesia during this  procedure due to their medical history including but not limited to prolonged intubation.  Patient voiced understanding. )        Anesthesia Quick Evaluation

## 2016-01-02 NOTE — Discharge Instructions (Signed)
Flexible Bronchoscopy, Care After Refer to this sheet in the next few weeks. These instructions provide you with information on caring for yourself after your procedure. Your health care provider may also give you more specific instructions. Your treatment has been planned according to current medical practices, but problems sometimes occur. Call your health care provider if you have any problems or questions after your procedure.  WHAT TO EXPECT AFTER THE PROCEDURE It is normal to have the following symptoms for 24-48 hours after the procedure:   Increased cough.  Low-grade fever.  Sore throat or hoarse voice.  Small streaks of blood in your thick spit (sputum) if tissue samples were taken (biopsy). HOME CARE INSTRUCTIONS   Do not eat or drink anything for 2 hours after your procedure. Your nose and throat were numbed by medicine. If you try to eat or drink before the medicine wears off, food or drink could go into your lungs or you could burn yourself. After the numbness is gone and your cough and gag reflexes have returned, you may eat soft food and drink liquids slowly.   The day after the procedure, you can go back to your normal diet.   You may resume normal activities.   Keep all follow-up visits as directed by your health care provider. It is important to keep all your appointments, especially if tissue samples were taken for testing (biopsy). SEEK IMMEDIATE MEDICAL CARE IF:   You have increasing shortness of breath.   You become light-headed or faint.   You have chest pain.   You have any new concerning symptoms.  You cough up more than a small amount of blood.  The amount of blood you cough up increases. MAKE SURE YOU:  Understand these instructions.  Will watch your condition.  Will get help right away if you are not doing well or get worse.   This information is not intended to replace advice given to you by your health care provider. Make sure you discuss  any questions you have with your health care provider.   Document Released: 03/08/2005 Document Revised: 09/09/2014 Document Reviewed: 04/23/2013 Elsevier Interactive Patient Education 2016 [place]   1) The drugs that you were given will stay in your system until tomorrow so for the next 24 hours you should not:  A) Drive an automobile B) Make any legal decisions C) Drink any alcoholic beverage   2) You may resume regular meals tomorrow.  Today it is better to start with liquids and gradually work up to solid foods.  You may eat anything you prefer, but it is better to start with liquids, then soup and crackers, and gradually work up to solid foods.   3) Please notify your doctor immediately if you have any unusual bleeding, trouble breathing, redness and pain at the surgery site, drainage, fever, or pain not relieved by medication. 4)   5) Your post-operative visit with Dr.                                     is: Date:                        Time:    Please call to schedule your post-operative visit.  6) Additional Instructions: 7)

## 2016-01-02 NOTE — Anesthesia Procedure Notes (Signed)
Procedure Name: Intubation Date/Time: 01/02/2016 1:39 PM Performed by: Aline Brochure Pre-anesthesia Checklist: Patient identified, Emergency Drugs available, Suction available and Patient being monitored Patient Re-evaluated:Patient Re-evaluated prior to inductionOxygen Delivery Method: Circle system utilized Preoxygenation: Pre-oxygenation with 100% oxygen Intubation Type: IV induction Ventilation: Mask ventilation without difficulty Laryngoscope Size: McGraph and 3 Grade View: Grade I Tube type: Oral Tube size: 8.5 mm Number of attempts: 1 Airway Equipment and Method: Video-laryngoscopy and Rigid stylet Placement Confirmation: ETT inserted through vocal cords under direct vision,  positive ETCO2 and breath sounds checked- equal and bilateral Secured at: 21 cm Tube secured with: Tape Dental Injury: Teeth and Oropharynx as per pre-operative assessment

## 2016-01-02 NOTE — Op Note (Signed)
Electromagnetic Navigation Bronchoscopy: Indication: lung mass  Preoperative Diagnosis: lung mass Post Procedure Diagnosis: lung mass Consent: written Risks and benefits explained in detail including risk of infection, bleeding, respiratory failure and death.   Hand washing performed prior to starting the procedure.   Type of Anesthesia: see Anesthesiology records .   Procedure Performed:  Virtual Bronchoscopy with Multi-planar Image analysis, 3-D reconstruction of coronal, sagittal and multi-planar images for the purposes of planning real-time bronchoscopy using the iLogic Electromagnetic Navigation Bronchoscopy System (superDimension)..   Description of Procedure: After obtaining informed consent from the patient, the above sedative and anesthetic measures were carried out, flexible fiberoptic bronchoscope was inserted via an oral bite block. Posterior pharynx was clear. The 2 vocal cords were easily traversed after application of local anesthetic.  The virtual camera was then placed into the central portion of the trachea. The trachea itself was inspected.  The main carina, right and left midstem bronchus and all the segmental and subsegmental airways by virtual bronchoscopy were brieftly inspected.  The camera was directed to standard registration points at the following centers: main carina, right upper lobe bronchus, right lower lobe bronchus, right middle lobe bronchus, left upper lobe bronchus, and the left lower lobe bronchus. This data was transferred to the i-Logic ENB system for real-time bronchoscopy.   I was able to navigate to [facility] and obtain samples as listed below  Specimans Obtained:  Fine Needle Aspirations 21G times:1  Forceps Biopsy times:2  Triple Needle Brush:2    Fluoroscopy:  Fluoroscopy was utilized during the course of this procedure to assure that biopsies were taken in a safe manner under fluoroscopic guidance with no spot films required.    Complications:none  Estimated Blood Loss: 1 cc  Monitoring:  The patient was monitored with continuous oximetry and received supplemental nasal cannula oxygen throughout the procedure. In addition, serial blood pressure measurements and continuous electrocardiography showed these physiologic parameters to remain tolerable throughout the procedure.   Assessment and Plan/Additional Comments:follow up pathology reports    Corrin Parker, M.D.  [facility]  Medical Director [place] Director [facility] Cardio-Pulmonary Department

## 2016-01-02 NOTE — Transfer of Care (Signed)
Immediate Anesthesia Transfer of Care Note  Patient: Lori Rangel  Procedure(s) Performed: Procedure(s): ELECTROMAGNETIC NAVIGATION BRONCHOSCOPY (N/A)  Patient Location: PACU  Anesthesia Type:General  Level of Consciousness: awake, alert  and oriented  Airway & Oxygen Therapy: Patient Spontanous Breathing and Patient connected to face mask oxygen  Post-op Assessment: Post -op Vital signs reviewed and stable  Post vital signs: stable  Last Vitals:  Filed Vitals:   01/02/16 1258 01/02/16 1503  BP: 142/85 105/93  Pulse: 93 119  Temp: 37.7 C 37.2 C  Resp: 18 26    Last Pain: There were no vitals filed for this visit.       Complications: No apparent anesthesia complications

## 2016-01-02 NOTE — H&P (View-Only) (Signed)
* [facility]     Assessment and Plan:  Lung nodule. -1.4 cm lung nodule, left upper lobe. Intermediate risk of lung cancer. -I discussed with the patient as well as the patient's family in the room, various options including biopsy, surveillance with imaging, no follow-up. -Given her relatively young age, and good functional status, we opted to attempt to obtain a tissue biopsy. The patient will be referred for a navigational bronchoscopic biopsy, to be performed by Dr. Mortimer Fries -The patient will need to be off of work until after the procedure.  Emphysema. -Patient has emphysematous changes in both lungs seen on CT chest. -Will start symbicort, the patient had a prescription for this, but it was too expensive, she is given a co-pay card, and asked to try to get the prescription. If not, she is asked to contact us and we will try different prescription. -I reviewed the results of the patient's Pulmicort function test today, she was informed that her PFT showed an FEV1 of 34%, which is consistent with severe emphysema.  Pneumonia. -Her pneumonia appears resolved, her breathing appears improved. No need for further antibiotics.  Nicotine Abuse.   -We discussed the importance of smoking cessation, and that continued smoking will increase her risk of, patient's with any lung biopsy. -She is asked to stop smoking, 1 week before the bronchoscopy.   Date: 12/21/2015  MRN# [id number] Lori Rangel 04/26/1961   Lori Rangel is a 55 y.o. old female seen in follow up for chief complaint of  Chief Complaint  Patient presents with  . Hospitalization Follow-up    pt states breathing has improved since hosp. occ prod cough clear in color & wheezing.     HPI:   The patient comes in today as a hospital follow up, she was recently seen in the hospital for a pneumonia/COPD exacerbation. At that time it was noted that the patient had a lung nodule. Review of her CT at that  time also showed significant emphysema.  She is not on symicort currently due to cost. She is no longer on the abx due to nausea. She only takes ventolin inhaler 2 puffs twice per day. She feels that it helps.  She is currently smoking about half a pack per day or less. She is continuing to lose weight.   View of the patient's PFT tracings: Spirometry FEV1 is 2.47 L which is 34% of predicted, there is no improvement with bronchodilator therapy. FEV1 to FVC ratio is 44, FVC is 62% of predicted. Lung volumes: Residual volume is 115% of predicted, TLC is 86% of predicted, RV/TLC ratio is elevated at 133, consistent with air trapping. Diffusion capacity: Diffusion capacity is reduced at 54% of predicted. 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 There is also severely reduced diffusion capacity and air trapping consistent with a diagnosis of emphysema.  Medication:   Outpatient Encounter Prescriptions as of 12/21/2015  Medication Sig  . albuterol (PROVENTIL HFA;VENTOLIN HFA) 108 (90 Base) MCG/ACT inhaler Inhale 2 puffs into the lungs every 6 (six) hours as needed for wheezing or shortness of breath.  . budesonide-formoterol (SYMBICORT) 80-4.5 MCG/ACT inhaler Inhale 2 puffs into the lungs 2 (two) times daily.  Marland Kitchen guaiFENesin (ROBITUSSIN) 100 MG/5ML SOLN Take 5 mLs (100 mg total) by mouth every 4 (four) hours as needed for cough or to loosen phlegm.  Marland Kitchen levofloxacin (LEVAQUIN) 750 MG tablet Take 1 tablet (750  mg total) by mouth daily.   No facility-administered encounter medications on file as of 12/21/2015.     Allergies:  Review of patient's allergies indicates no known allergies.  Review of Systems: Gen:  Denies  fever, sweats. HEENT: Denies blurred vision. Cvc:  No dizziness, chest pain or heaviness Resp:   Denies cough or sputum porduction. Gi: Denies swallowing difficulty, stomach pain. constipation, bowel  incontinence Gu:  Denies bladder incontinence, burning urine Ext:   No Joint pain, stiffness. Skin: No skin rash, easy bruising. Endoc:  No polyuria, polydipsia. Psych: No depression, insomnia. Other:  All other systems were reviewed and found to be negative other than what is mentioned in the HPI.   Physical Examination:   VS: BP 132/74 mmHg  Pulse 89  Ht '5\' 2"'$  (1.575 m)  Wt 78 lb 6.4 oz (35.562 kg)  BMI 14.34 kg/m2  SpO2 94%  General Appearance: No distress  Neuro:without focal findings,  speech normal,  HEENT: PERRLA, EOM intact. Pulmonary: normal breath sounds, No wheezing. Decreased air entry bilaterally.   CardiovascularNormal S1,S2.  No m/r/g.   Abdomen: Benign, Soft, non-tender. Renal:  No costovertebral tenderness  GU:  Not performed at this time. Endoc: No evident thyromegaly, no signs of acromegaly. Skin:   warm, no rash. Extremities: normal, no cyanosis, clubbing.   LABORATORY PANEL:   CBC  Recent Labs Lab 12/16/15 0436  WBC 1.1*  HGB 13.7  HCT 39.7  PLT 124*   ------------------------------------------------------------------------------------------------------------------  Chemistries   Recent Labs Lab 12/15/15 1024 12/16/15 0436 12/17/15 1016  NA 127* 130*  --   K 3.5 2.9* 3.6  CL 94* 101  --   CO2 23 22  --   GLUCOSE 74 68  --   BUN <5* <5*  --   CREATININE 0.43* 0.48  --   CALCIUM 7.9* 7.3*  --   AST 226*  --   --   ALT 73*  --   --   ALKPHOS 139*  --   --   BILITOT 0.4  --   --    ------------------------------------------------------------------------------------------------------------------  Cardiac Enzymes No results for input(s): TROPONINI in the last 168 hours. ------------------------------------------------------------  RADIOLOGY:   No results found for this or any previous visit. Results for orders placed during the hospital encounter of 12/15/15  DG Chest 2 View   Narrative CLINICAL DATA:  Cough, chest congestion  for the past week; history of COPD and previous episodes of pneumonia and bronchitis, current smoker.  EXAM: CHEST  2 VIEW  COMPARISON:  None in PACs  FINDINGS: The lungs are hyperinflated with mild hemidiaphragm flattening and increased AP dimension of the thorax. There is atelectasis of a portion of the right middle lobe. There is no alveolar infiltrate or pleural effusion. There is subtle nodular density in the left suprahilar region. The trachea is midline. The heart and pulmonary vascularity are normal. There is calcification in the wall of the descending thoracic aorta. The bony thorax exhibits no acute abnormality.  IMPRESSION: COPD. Atelectasis or early pneumonia in the right middle lobe. Subtle nodular density in the left suprahilar region likely anteriorly in the inferior aspect of the upper lobe.  At minimum followup PA and lateral chest X-ray is recommended in 3-4 weeks following trial of antibiotic therapy to ensure resolution and exclude underlying malignancy. However given the possibly postobstructive findings in the right middle lobe and the subtle nodule in the left suprahilar region, chest CT scanning now would be a useful next imaging  step to exclude a centrally obstructing lesion and/or abnormal parenchymal mass.   Electronically Signed   By: David  Martinique M.D.   On: 12/15/2015 11:26    ------------------------------------------------------------------------------------------------------------------  Thank  you for allowing [facility] to assist in the care of your patient. Our recommendations are noted above.  Please contact us if we can be of further service.   Marda Stalker, MD.  [facility] Office Number: [phone]  Patricia Pesa, M.D.  Vilinda Boehringer, M.D.  Merton Border, M.D  12/21/2015

## 2016-01-03 ENCOUNTER — Telehealth: Payer: Self-pay | Admitting: *Deleted

## 2016-01-03 DIAGNOSIS — R918 Other nonspecific abnormal finding of lung field: Secondary | ICD-10-CM

## 2016-01-03 LAB — CYTOLOGY - NON PAP

## 2016-01-03 LAB — SURGICAL PATHOLOGY

## 2016-01-03 NOTE — Addendum Note (Signed)
Addended by: Oscar La R on: 01/03/2016 02:52 PM   Modules accepted: Orders

## 2016-01-03 NOTE — Telephone Encounter (Signed)
Per KK, place oncology referral. Order placed.

## 2016-01-03 NOTE — Telephone Encounter (Signed)
Per KK, PET scan needs to be ordered. Order placed.

## 2016-01-04 NOTE — Anesthesia Postprocedure Evaluation (Signed)
Anesthesia Post Note  Patient: Lori Rangel  Procedure(s) Performed: Procedure(s) (LRB): ELECTROMAGNETIC NAVIGATION BRONCHOSCOPY (N/A)  Patient location during evaluation: PACU Anesthesia Type: General Level of consciousness: awake and alert Pain management: pain level controlled Vital Signs Assessment: post-procedure vital signs reviewed and stable Respiratory status: spontaneous breathing, nonlabored ventilation, respiratory function stable and patient connected to nasal cannula oxygen Cardiovascular status: blood pressure returned to baseline and stable Postop Assessment: no signs of nausea or vomiting Anesthetic complications: no    Last Vitals:  Filed Vitals:   01/02/16 1552 01/02/16 1628  BP: 116/70 93/47  Pulse: 74 97  Temp: 35.7 C   Resp: 20 18    Last Pain:  Filed Vitals:   01/03/16 0819  PainSc: 0-No pain                 Precious Haws Piscitello

## 2016-01-08 ENCOUNTER — Ambulatory Visit: Admission: RE | Admit: 2016-01-08 | Payer: BLUE CROSS/BLUE SHIELD | Source: Ambulatory Visit

## 2016-01-08 ENCOUNTER — Telehealth: Payer: Self-pay | Admitting: Internal Medicine

## 2016-01-08 NOTE — Telephone Encounter (Signed)
Spoke with daughter and informed her of PET scan appt. Informed we were waiting on results.   When you receive results please advise on what to tell pt and daughter. Thanks.

## 2016-01-08 NOTE — Telephone Encounter (Signed)
Pt daughter would like bx results. Please call. Also asks about PET scan appt.

## 2016-01-08 NOTE — Telephone Encounter (Signed)
Pt and daughter Juliann Pulse informed. Appt scheduled to f/u after PET scan. Nothing further needed.

## 2016-01-08 NOTE — Telephone Encounter (Signed)
Informed patient that bronchoscopy did not tell us what the nodule is (non-diagnostic), so we will be sending her for a PET scan. Inforned her that she will need to see me in office the following week to review results.

## 2016-01-10 ENCOUNTER — Ambulatory Visit
Admission: RE | Admit: 2016-01-10 | Discharge: 2016-01-10 | Disposition: A | Discharge status: Home / Self Care | Payer: BLUE CROSS/BLUE SHIELD | Source: Ambulatory Visit | Attending: Internal Medicine | Admitting: Internal Medicine

## 2016-01-10 ENCOUNTER — Ambulatory Visit: Payer: BLUE CROSS/BLUE SHIELD

## 2016-01-10 DIAGNOSIS — J9819 Other pulmonary collapse: Secondary | ICD-10-CM | POA: Insufficient documentation

## 2016-01-10 DIAGNOSIS — R590 Localized enlarged lymph nodes: Secondary | ICD-10-CM | POA: Insufficient documentation

## 2016-01-10 DIAGNOSIS — R918 Other nonspecific abnormal finding of lung field: Secondary | ICD-10-CM | POA: Insufficient documentation

## 2016-01-10 LAB — GLUCOSE, CAPILLARY: GLUCOSE-CAPILLARY: 86 mg/dL (ref 65–99)

## 2016-01-10 MED ORDER — FLUDEOXYGLUCOSE F - 18 (FDG) INJECTION
12.8800 | Freq: Once | INTRAVENOUS | Status: AC | PRN
  Administered 2016-01-10: 12.88 via INTRAVENOUS

## 2016-01-12 ENCOUNTER — Telehealth: Payer: Self-pay | Admitting: Internal Medicine

## 2016-01-12 NOTE — Telephone Encounter (Signed)
Pt daughter called, would like PET scan results

## 2016-01-12 NOTE — Telephone Encounter (Signed)
Informed daughter per DR pt is scheduled to come in for results. Nothing further needed.

## 2016-01-18 ENCOUNTER — Encounter: Payer: Self-pay | Admitting: Internal Medicine

## 2016-01-18 ENCOUNTER — Ambulatory Visit (INDEPENDENT_AMBULATORY_CARE_PROVIDER_SITE_OTHER): Payer: BLUE CROSS/BLUE SHIELD | Admitting: Internal Medicine

## 2016-01-18 ENCOUNTER — Telehealth: Payer: Self-pay | Admitting: Internal Medicine

## 2016-01-18 VITALS — BP 100/66 | HR 110 | Ht 62.0 in | Wt 73.0 lb

## 2016-01-18 DIAGNOSIS — R918 Other nonspecific abnormal finding of lung field: Secondary | ICD-10-CM | POA: Diagnosis not present

## 2016-01-18 MED ORDER — MEGESTROL ACETATE 400 MG/10ML PO SUSP
400.0000 mg | Freq: Two times a day (BID) | ORAL | Status: DC
Start: 2016-01-18 — End: 2018-08-29

## 2016-01-18 NOTE — Addendum Note (Signed)
Addended by: Maryanna Shape A on: 01/18/2016 11:27 AM   Modules accepted: Orders

## 2016-01-18 NOTE — Addendum Note (Signed)
Addended by: Maryanna Shape A on: 01/18/2016 11:43 AM   Modules accepted: Orders

## 2016-01-18 NOTE — Patient Instructions (Signed)
Will send for CT guided needle biopsy.;  For appetite; start megace 400 mg twice daily for 3 months.

## 2016-01-18 NOTE — Telephone Encounter (Signed)
Patient seeing [facility] oncologist and needs images on cd to pick up and notes/ results faxed to clinic .  Please call to discuss.  Patient has appt Tuesday   [facility]  [phone] fax   [phone] tele

## 2016-01-18 NOTE — Progress Notes (Signed)
* [facility]     Assessment and Plan:  Lung nodule. -1.4 cm lung nodule, left upper lobe. Intermediate risk of lung cancer. -Status post bronchoscopic biopsy which was nondiagnostic. -We discussed other approaches, which could include CT-guided needle biopsy. The patient also has an appointment to see CT surgery.   Emphysema. -Patient has emphysematous changes in both lungs seen on CT chest. -Will start symbicort, the patient had a prescription for this, but it was too expensive, she is given a co-pay card, and asked to try to get the prescription. If not, she is asked to contact us and we will try different prescription. -PFT showed an FEV1 of 34%, which is consistent with severe emphysema.  Nicotine Abuse.   -We discussed the importance of smoking cessation, and that continued smoking will increase her risk of, patient's with any lung biopsy. -Again reiterated the importance of smoking cessation.   Protein malnutrition/cachexia.  -Discussed importance of continuing Ensure 3 times a day. -Prescribed Megace 400 mg by mouth twice a day today  Date: 01/18/2016  MRN# [id number] Lori Rangel 27-Oct-1960   Lori Rangel is a 55 y.o. old female seen in follow up for chief complaint of  Chief Complaint  Patient presents with  . Follow-up    PET results. pt cont sob, prod cough clear in color, occ wheezing.     HPI:   The patient comes in today as a hospital follow up, she was recently seen in the hospital for a pneumonia/COPD exacerbation. At that time it was noted that the patient had a lung nodule. Review of her CT at that time also showed significant emphysema.  She is not on symicort currently due to cost. She is no longer on the abx due to nausea. She only takes ventolin inhaler 2 puffs twice per day. She feels that it helps.  She is currently smoking about half a pack per day or less. She is continuing to lose weight.   PET scan imagesReviewed from  01/10/16: Spiculated left upper lobe pulmonary nodule up to 1.7 cm in maximum diameter. This nodule is markedly hypermetabolic with SUV max = 6.8  PFT 12/19/15, Severe (stage III) emphysema on the cusp of becoming stage IV emphysema. There is also severely reduced diffusion capacity and air trapping consistent with a diagnosis of emphysema.  Medication:   Outpatient Encounter Prescriptions as of 01/18/2016  Medication Sig  . albuterol (PROVENTIL HFA;VENTOLIN HFA) 108 (90 Base) MCG/ACT inhaler Inhale 2 puffs into the lungs every 6 (six) hours as needed for wheezing or shortness of breath.  . budesonide-formoterol (SYMBICORT) 160-4.5 MCG/ACT inhaler Inhale 2 puffs into the lungs 2 (two) times daily.  Marland Kitchen guaiFENesin (ROBITUSSIN) 100 MG/5ML SOLN Take 5 mLs (100 mg total) by mouth every 4 (four) hours as needed for cough or to loosen phlegm.  . varenicline (CHANTIX CONTINUING MONTH PAK) 1 MG tablet Take 1 tablet (1 mg total) by mouth 2 (two) times daily.   No facility-administered encounter medications on file as of 01/18/2016.     Allergies:  Levaquin  Review of Systems: Gen:  Denies  fever, sweats. HEENT: Denies blurred vision. Cvc:  No dizziness, chest pain or heaviness Resp:   Denies cough or sputum porduction. Gi: Denies swallowing difficulty, stomach pain.  Gu:  Denies bladder incontinence, burning urine Ext:   No Joint pain, stiffness. Skin: No skin rash, easy bruising. Endoc:  No polyuria, polydipsia. Psych: No depression, insomnia. Other:  All other systems were  reviewed and found to be negative other than what is mentioned in the HPI.   Physical Examination:   VS: BP 100/66 mmHg  Pulse 110  Ht '5\' 2"'$  (1.575 m)  Wt 73 lb (33.113 kg)  BMI 13.35 kg/m2  SpO2 91%  General Appearance: No distress  Neuro:without focal findings,  speech normal,  HEENT: PERRLA, EOM intact. Pulmonary: normal breath sounds, No wheezing. Decreased air entry bilaterally.   CardiovascularNormal S1,S2.  No  m/r/g.   Abdomen: Benign, Soft, non-tender. Renal:  No costovertebral tenderness  GU:  Not performed at this time. Endoc: No evident thyromegaly, no signs of acromegaly. Skin:   warm, no rash. Extremities: normal, no cyanosis, clubbing.   LABORATORY PANEL:   CBC No results for input(s): WBC, HGB, HCT, PLT in the last 168 hours. ------------------------------------------------------------------------------------------------------------------  Chemistries  No results for input(s): NA, K, CL, CO2, GLUCOSE, BUN, CREATININE, CALCIUM, MG, AST, ALT, ALKPHOS, BILITOT in the last 168 hours.  Invalid input(s): GFRCGP ------------------------------------------------------------------------------------------------------------------  Cardiac Enzymes No results for input(s): TROPONINI in the last 168 hours. ------------------------------------------------------------  RADIOLOGY:   No results found for this or any previous visit. Results for orders placed during the hospital encounter of 12/15/15  DG Chest 2 View   Narrative CLINICAL DATA:  Cough, chest congestion for the past week; history of COPD and previous episodes of pneumonia and bronchitis, current smoker.  EXAM: CHEST  2 VIEW  COMPARISON:  None in PACs  FINDINGS: The lungs are hyperinflated with mild hemidiaphragm flattening and increased AP dimension of the thorax. There is atelectasis of a portion of the right middle lobe. There is no alveolar infiltrate or pleural effusion. There is subtle nodular density in the left suprahilar region. The trachea is midline. The heart and pulmonary vascularity are normal. There is calcification in the wall of the descending thoracic aorta. The bony thorax exhibits no acute abnormality.  IMPRESSION: COPD. Atelectasis or early pneumonia in the right middle lobe. Subtle nodular density in the left suprahilar region likely anteriorly in the inferior aspect of the upper lobe.  At  minimum followup PA and lateral chest X-ray is recommended in 3-4 weeks following trial of antibiotic therapy to ensure resolution and exclude underlying malignancy. However given the possibly postobstructive findings in the right middle lobe and the subtle nodule in the left suprahilar region, chest CT scanning now would be a useful next imaging step to exclude a centrally obstructing lesion and/or abnormal parenchymal mass.   Electronically Signed   By: David  Martinique M.D.   On: 12/15/2015 11:26    ------------------------------------------------------------------------------------------------------------------  Thank  you for allowing [facility] to assist in the care of your patient. Our recommendations are noted above.  Please contact us if we can be of further service.   Marda Stalker, MD.  [facility] Office Number: [phone]  Patricia Pesa, M.D.  Vilinda Boehringer, M.D.  Merton Border, M.D  01/18/2016

## 2016-01-19 NOTE — Telephone Encounter (Signed)
Spoke with daughter Tye [place] and informed her to call the hospital and get pt's images on CD. I will fax OV notes and labs to [facility]. Nothing further needed.

## 2016-01-23 ENCOUNTER — Telehealth: Payer: Self-pay

## 2016-01-23 ENCOUNTER — Ambulatory Visit: Payer: Self-pay | Admitting: Cardiothoracic Surgery

## 2016-01-23 NOTE — Telephone Encounter (Signed)
Patient was a no show for today's appointment. Call made at this time to reschedule appointment to see Dr. Genevive Bi.  Patient informed me that she is seeing Dr. Laverta Baltimore (Surgical Oncologist) at [facility] and does not wish to see Dr. Genevive Bi in regards to her lung mass. "I don't want to see a surgeon, that's for damn sure."  Explained to patient that we would be glad to see her in the future should she wish to reschedule. She hung up on me.

## 2016-01-25 DIAGNOSIS — J439 Emphysema, unspecified: Secondary | ICD-10-CM | POA: Insufficient documentation

## 2016-01-30 ENCOUNTER — Telehealth: Payer: Self-pay | Admitting: Cardiothoracic Surgery

## 2016-01-30 NOTE — Telephone Encounter (Signed)
I have called patient to reschedule a no show from 01/23/16 with Dr Genevive Bi. Patient's Daughter stated that they are seeing someone at [facility] at this time. She also stated that Dr Ashby Dawes stated that her mother would not be a surgical candidate. I have advised her that if they shall need [place] in the future to please contact our office. The patient is still follow up with Dr Ashby Dawes.

## 2016-05-24 ENCOUNTER — Other Ambulatory Visit: Payer: Self-pay | Admitting: Internal Medicine

## 2016-06-07 DIAGNOSIS — I700 Atherosclerosis of aorta: Secondary | ICD-10-CM | POA: Insufficient documentation

## 2016-08-13 ENCOUNTER — Telehealth: Payer: Self-pay | Admitting: Cardiovascular Disease

## 2016-08-13 NOTE — Telephone Encounter (Signed)
Received records request Disability Determination Services, forwarded to [employer] for processing.  

## 2016-09-12 ENCOUNTER — Other Ambulatory Visit: Payer: Self-pay | Admitting: Physical Medicine and Rehabilitation

## 2016-09-12 DIAGNOSIS — R942 Abnormal results of pulmonary function studies: Secondary | ICD-10-CM

## 2016-10-01 ENCOUNTER — Ambulatory Visit: Payer: Disability Insurance | Attending: Physical Medicine and Rehabilitation

## 2016-10-01 DIAGNOSIS — J984 Other disorders of lung: Secondary | ICD-10-CM | POA: Diagnosis not present

## 2016-10-01 DIAGNOSIS — R942 Abnormal results of pulmonary function studies: Secondary | ICD-10-CM

## 2016-10-01 MED ORDER — ALBUTEROL SULFATE (2.5 MG/3ML) 0.083% IN NEBU
2.5000 mg | INHALATION_SOLUTION | Freq: Once | RESPIRATORY_TRACT | Status: AC
  Administered 2016-10-01: 2.5 mg via RESPIRATORY_TRACT
  Filled 2016-10-01: qty 3

## 2016-10-14 ENCOUNTER — Other Ambulatory Visit: Payer: Self-pay | Admitting: Internal Medicine

## 2016-12-09 ENCOUNTER — Ambulatory Visit
Admission: RE | Admit: 2016-12-09 | Discharge: 2016-12-09 | Disposition: A | Discharge status: Home / Self Care | Payer: Disability Insurance | Source: Ambulatory Visit | Attending: Internal Medicine | Admitting: Internal Medicine

## 2016-12-09 ENCOUNTER — Other Ambulatory Visit: Payer: Self-pay | Admitting: Internal Medicine

## 2016-12-09 DIAGNOSIS — M25561 Pain in right knee: Secondary | ICD-10-CM

## 2016-12-09 DIAGNOSIS — C3490 Malignant neoplasm of unspecified part of unspecified bronchus or lung: Secondary | ICD-10-CM | POA: Insufficient documentation

## 2016-12-09 DIAGNOSIS — R0600 Dyspnea, unspecified: Secondary | ICD-10-CM | POA: Insufficient documentation

## 2016-12-09 DIAGNOSIS — M25562 Pain in left knee: Secondary | ICD-10-CM | POA: Diagnosis not present

## 2016-12-09 DIAGNOSIS — J9819 Other pulmonary collapse: Secondary | ICD-10-CM

## 2016-12-09 DIAGNOSIS — M8580 Other specified disorders of bone density and structure, unspecified site: Secondary | ICD-10-CM | POA: Diagnosis not present

## 2016-12-09 DIAGNOSIS — I700 Atherosclerosis of aorta: Secondary | ICD-10-CM | POA: Insufficient documentation

## 2017-11-03 ENCOUNTER — Other Ambulatory Visit: Payer: Self-pay

## 2017-11-03 ENCOUNTER — Inpatient Hospital Stay: Payer: Medicaid Other

## 2017-11-03 ENCOUNTER — Encounter
Admission: EM | Disposition: A | Discharge status: Home Health | Payer: Self-pay | Source: Home / Self Care | Attending: Family Medicine

## 2017-11-03 ENCOUNTER — Inpatient Hospital Stay
Admission: EM | Admit: 2017-11-03 | Discharge: 2017-11-05 | DRG: 481 | Disposition: A | Discharge status: Home Health | Payer: Medicaid Other | Attending: Family Medicine | Admitting: Family Medicine

## 2017-11-03 ENCOUNTER — Inpatient Hospital Stay: Payer: Medicaid Other | Admitting: Anesthesiology

## 2017-11-03 ENCOUNTER — Emergency Department: Payer: Medicaid Other

## 2017-11-03 DIAGNOSIS — Z888 Allergy status to other drugs, medicaments and biological substances status: Secondary | ICD-10-CM

## 2017-11-03 DIAGNOSIS — J449 Chronic obstructive pulmonary disease, unspecified: Secondary | ICD-10-CM | POA: Diagnosis present

## 2017-11-03 DIAGNOSIS — R0602 Shortness of breath: Secondary | ICD-10-CM | POA: Diagnosis present

## 2017-11-03 DIAGNOSIS — Z79899 Other long term (current) drug therapy: Secondary | ICD-10-CM

## 2017-11-03 DIAGNOSIS — E222 Syndrome of inappropriate secretion of antidiuretic hormone: Secondary | ICD-10-CM | POA: Diagnosis present

## 2017-11-03 DIAGNOSIS — Y907 Blood alcohol level of 200-239 mg/100 ml: Secondary | ICD-10-CM | POA: Diagnosis present

## 2017-11-03 DIAGNOSIS — R634 Abnormal weight loss: Secondary | ICD-10-CM | POA: Diagnosis present

## 2017-11-03 DIAGNOSIS — Z8781 Personal history of (healed) traumatic fracture: Secondary | ICD-10-CM | POA: Diagnosis present

## 2017-11-03 DIAGNOSIS — Z7951 Long term (current) use of inhaled steroids: Secondary | ICD-10-CM

## 2017-11-03 DIAGNOSIS — Y92000 Kitchen of unspecified non-institutional (private) residence as  the place of occurrence of the external cause: Secondary | ICD-10-CM | POA: Diagnosis not present

## 2017-11-03 DIAGNOSIS — Z419 Encounter for procedure for purposes other than remedying health state, unspecified: Secondary | ICD-10-CM

## 2017-11-03 DIAGNOSIS — W010XXA Fall on same level from slipping, tripping and stumbling without subsequent striking against object, initial encounter: Secondary | ICD-10-CM | POA: Diagnosis present

## 2017-11-03 DIAGNOSIS — C3490 Malignant neoplasm of unspecified part of unspecified bronchus or lung: Secondary | ICD-10-CM | POA: Diagnosis present

## 2017-11-03 DIAGNOSIS — F10129 Alcohol abuse with intoxication, unspecified: Secondary | ICD-10-CM | POA: Diagnosis present

## 2017-11-03 DIAGNOSIS — E871 Hypo-osmolality and hyponatremia: Secondary | ICD-10-CM

## 2017-11-03 DIAGNOSIS — F17210 Nicotine dependence, cigarettes, uncomplicated: Secondary | ICD-10-CM | POA: Diagnosis present

## 2017-11-03 DIAGNOSIS — Z7982 Long term (current) use of aspirin: Secondary | ICD-10-CM | POA: Diagnosis not present

## 2017-11-03 DIAGNOSIS — F10929 Alcohol use, unspecified with intoxication, unspecified: Secondary | ICD-10-CM

## 2017-11-03 DIAGNOSIS — F1010 Alcohol abuse, uncomplicated: Secondary | ICD-10-CM | POA: Diagnosis present

## 2017-11-03 DIAGNOSIS — S72001A Fracture of unspecified part of neck of right femur, initial encounter for closed fracture: Secondary | ICD-10-CM | POA: Diagnosis present

## 2017-11-03 DIAGNOSIS — S72141A Displaced intertrochanteric fracture of right femur, initial encounter for closed fracture: Principal | ICD-10-CM

## 2017-11-03 HISTORY — DX: Malignant (primary) neoplasm, unspecified: C80.1

## 2017-11-03 HISTORY — PX: INTRAMEDULLARY (IM) NAIL INTERTROCHANTERIC: SHX5875

## 2017-11-03 LAB — CBC
HEMATOCRIT: 34 % — AB (ref 35.0–47.0)
Hemoglobin: 11.7 g/dL — ABNORMAL LOW (ref 12.0–16.0)
MCH: 36.9 pg — ABNORMAL HIGH (ref 26.0–34.0)
MCHC: 34.4 g/dL (ref 32.0–36.0)
MCV: 107.4 fL — AB (ref 80.0–100.0)
Platelets: 311 10*3/uL (ref 150–440)
RBC: 3.16 MIL/uL — AB (ref 3.80–5.20)
RDW: 13.7 % (ref 11.5–14.5)
WBC: 7.5 10*3/uL (ref 3.6–11.0)

## 2017-11-03 LAB — SURGICAL PCR SCREEN
MRSA, PCR: NEGATIVE
Staphylococcus aureus: POSITIVE — AB

## 2017-11-03 LAB — CBC WITH DIFFERENTIAL/PLATELET
Basophils Absolute: 0 10*3/uL (ref 0–0.1)
Basophils Relative: 0 %
EOS ABS: 0 10*3/uL (ref 0–0.7)
Eosinophils Relative: 0 %
HEMATOCRIT: 37.1 % (ref 35.0–47.0)
HEMOGLOBIN: 13.1 g/dL (ref 12.0–16.0)
LYMPHS ABS: 0.6 10*3/uL — AB (ref 1.0–3.6)
Lymphocytes Relative: 8 %
MCH: 37.7 pg — AB (ref 26.0–34.0)
MCHC: 35.4 g/dL (ref 32.0–36.0)
MCV: 106.6 fL — ABNORMAL HIGH (ref 80.0–100.0)
MONO ABS: 0.3 10*3/uL (ref 0.2–0.9)
MONOS PCT: 4 %
NEUTROS PCT: 88 %
Neutro Abs: 7.1 10*3/uL — ABNORMAL HIGH (ref 1.4–6.5)
Platelets: 347 10*3/uL (ref 150–440)
RBC: 3.47 MIL/uL — ABNORMAL LOW (ref 3.80–5.20)
RDW: 13.6 % (ref 11.5–14.5)
WBC: 8.1 10*3/uL (ref 3.6–11.0)

## 2017-11-03 LAB — COMPREHENSIVE METABOLIC PANEL
ALBUMIN: 2.9 g/dL — AB (ref 3.5–5.0)
ALK PHOS: 220 U/L — AB (ref 38–126)
ALT: 29 U/L (ref 14–54)
AST: 55 U/L — AB (ref 15–41)
Anion gap: 14 (ref 5–15)
BUN: <5 mg/dL — ABNORMAL LOW (ref 6–20)
CALCIUM: 7.9 mg/dL — AB (ref 8.9–10.3)
CHLORIDE: 89 mmol/L — AB (ref 101–111)
CO2: 20 mmol/L — AB (ref 22–32)
CREATININE: 0.4 mg/dL — AB (ref 0.44–1.00)
GFR calc Af Amer: >60 mL/min (ref >60)
GFR calc non Af Amer: >60 mL/min (ref >60)
GLUCOSE: 107 mg/dL — AB (ref 65–99)
Potassium: 3.8 mmol/L (ref 3.5–5.1)
SODIUM: 123 mmol/L — AB (ref 135–145)
Total Bilirubin: 0.7 mg/dL (ref 0.3–1.2)
Total Protein: 6.5 g/dL (ref 6.5–8.1)

## 2017-11-03 LAB — TYPE AND SCREEN
ABO/RH(D): O POS
ANTIBODY SCREEN: NEGATIVE

## 2017-11-03 LAB — GLUCOSE, CAPILLARY: GLUCOSE-CAPILLARY: 85 mg/dL (ref 65–99)

## 2017-11-03 LAB — BASIC METABOLIC PANEL
Anion gap: 12 (ref 5–15)
BUN: <5 mg/dL — ABNORMAL LOW (ref 6–20)
CHLORIDE: 94 mmol/L — AB (ref 101–111)
CO2: 19 mmol/L — ABNORMAL LOW (ref 22–32)
Calcium: 7.3 mg/dL — ABNORMAL LOW (ref 8.9–10.3)
Creatinine, Ser: 0.3 mg/dL — ABNORMAL LOW (ref 0.44–1.00)
GFR calc non Af Amer: >60 mL/min (ref >60)
Glucose, Bld: 102 mg/dL — ABNORMAL HIGH (ref 65–99)
POTASSIUM: 3.8 mmol/L (ref 3.5–5.1)
SODIUM: 125 mmol/L — AB (ref 135–145)

## 2017-11-03 LAB — PROTIME-INR
INR: 0.96
Prothrombin Time: 12.7 seconds (ref 11.4–15.2)

## 2017-11-03 LAB — ETHANOL: Alcohol, Ethyl (B): 234 mg/dL — ABNORMAL HIGH (ref <10)

## 2017-11-03 SURGERY — FIXATION, FRACTURE, INTERTROCHANTERIC, WITH INTRAMEDULLARY ROD
Anesthesia: General | Laterality: Right

## 2017-11-03 MED ORDER — ROCURONIUM BROMIDE 100 MG/10ML IV SOLN
INTRAVENOUS | Status: DC | PRN
  Administered 2017-11-03: 30 mg via INTRAVENOUS

## 2017-11-03 MED ORDER — ALBUTEROL SULFATE (2.5 MG/3ML) 0.083% IN NEBU: 2.5000 mg | INHALATION_SOLUTION | Freq: Once | RESPIRATORY_TRACT | Status: DC

## 2017-11-03 MED ORDER — ENOXAPARIN SODIUM 40 MG/0.4ML ~~LOC~~ SOLN
40.0000 mg | SUBCUTANEOUS | Status: DC
  Administered 2017-11-04 – 2017-11-05 (×2): 40 mg via SUBCUTANEOUS
  Filled 2017-11-03 (×2): qty 0.4

## 2017-11-03 MED ORDER — SODIUM CHLORIDE 0.9 % IR SOLN
Status: DC | PRN
  Administered 2017-11-03: 60 mL

## 2017-11-03 MED ORDER — HYDROMORPHONE HCL 1 MG/ML IJ SOLN
1.0000 mg | INTRAMUSCULAR | Status: DC | PRN
  Administered 2017-11-03 (×2): 1 mg via INTRAVENOUS
  Filled 2017-11-03 (×2): qty 1

## 2017-11-03 MED ORDER — DOCUSATE SODIUM 100 MG PO CAPS: 100.0000 mg | ORAL_CAPSULE | Freq: Two times a day (BID) | ORAL | Status: DC

## 2017-11-03 MED ORDER — CHLORHEXIDINE GLUCONATE CLOTH 2 % EX PADS
6.0000 | MEDICATED_PAD | Freq: Every day | CUTANEOUS | Status: DC
  Administered 2017-11-03 – 2017-11-05 (×3): 6 via TOPICAL

## 2017-11-03 MED ORDER — LACTATED RINGERS IV SOLN
INTRAVENOUS | Status: DC | PRN
  Administered 2017-11-03: 17:00:00 via INTRAVENOUS

## 2017-11-03 MED ORDER — ONDANSETRON HCL 4 MG PO TABS: 4.0000 mg | ORAL_TABLET | Freq: Four times a day (QID) | ORAL | Status: DC | PRN

## 2017-11-03 MED ORDER — OXYCODONE HCL 5 MG PO TABS
5.0000 mg | ORAL_TABLET | ORAL | Status: DC | PRN
  Administered 2017-11-04 (×2): 5 mg via ORAL
  Filled 2017-11-03 (×2): qty 1

## 2017-11-03 MED ORDER — PROPOFOL 10 MG/ML IV BOLUS
INTRAVENOUS | Status: DC | PRN
  Administered 2017-11-03: 60 mg via INTRAVENOUS

## 2017-11-03 MED ORDER — LIDOCAINE HCL (PF) 1 % IJ SOLN
INTRAMUSCULAR | Status: AC
  Filled 2017-11-03: qty 30

## 2017-11-03 MED ORDER — ORAL CARE MOUTH RINSE
15.0000 mL | Freq: Two times a day (BID) | OROMUCOSAL | Status: DC
  Administered 2017-11-03 – 2017-11-05 (×5): 15 mL via OROMUCOSAL

## 2017-11-03 MED ORDER — FENTANYL CITRATE (PF) 100 MCG/2ML IJ SOLN
INTRAMUSCULAR | Status: AC
  Filled 2017-11-03: qty 2

## 2017-11-03 MED ORDER — ASPIRIN EC 81 MG PO TBEC
81.0000 mg | DELAYED_RELEASE_TABLET | Freq: Every day | ORAL | Status: DC
  Administered 2017-11-04 – 2017-11-05 (×2): 81 mg via ORAL
  Filled 2017-11-03 (×2): qty 1

## 2017-11-03 MED ORDER — FENTANYL CITRATE (PF) 100 MCG/2ML IJ SOLN: 25.0000 ug | INTRAMUSCULAR | Status: DC | PRN

## 2017-11-03 MED ORDER — MORPHINE SULFATE (PF) 4 MG/ML IV SOLN
4.0000 mg | Freq: Once | INTRAVENOUS | Status: DC
  Filled 2017-11-03: qty 1

## 2017-11-03 MED ORDER — LIDOCAINE HCL 1 % IJ SOLN
INTRAMUSCULAR | Status: DC | PRN
  Administered 2017-11-03: 25 mL

## 2017-11-03 MED ORDER — BUPIVACAINE HCL (PF) 0.5 % IJ SOLN
INTRAMUSCULAR | Status: AC
  Filled 2017-11-03: qty 10

## 2017-11-03 MED ORDER — ONDANSETRON HCL 4 MG/2ML IJ SOLN
INTRAMUSCULAR | Status: DC | PRN
  Administered 2017-11-03: 4 mg via INTRAVENOUS

## 2017-11-03 MED ORDER — METHOCARBAMOL 500 MG PO TABS: 500.0000 mg | ORAL_TABLET | Freq: Four times a day (QID) | ORAL | Status: DC | PRN

## 2017-11-03 MED ORDER — ONDANSETRON HCL 4 MG/2ML IJ SOLN: 4.0000 mg | Freq: Four times a day (QID) | INTRAMUSCULAR | Status: DC | PRN

## 2017-11-03 MED ORDER — MIDAZOLAM HCL 5 MG/5ML IJ SOLN
INTRAMUSCULAR | Status: DC | PRN
  Administered 2017-11-03: 2 mg via INTRAVENOUS

## 2017-11-03 MED ORDER — ROCURONIUM BROMIDE 50 MG/5ML IV SOLN
INTRAVENOUS | Status: AC
  Filled 2017-11-03: qty 1

## 2017-11-03 MED ORDER — FENTANYL CITRATE (PF) 100 MCG/2ML IJ SOLN
75.0000 ug | Freq: Once | INTRAMUSCULAR | Status: AC
  Administered 2017-11-03: 75 ug via INTRAVENOUS
  Filled 2017-11-03: qty 2

## 2017-11-03 MED ORDER — THIAMINE HCL 100 MG/ML IJ SOLN: 100.0000 mg | Freq: Every day | INTRAMUSCULAR | Status: DC

## 2017-11-03 MED ORDER — SUGAMMADEX SODIUM 200 MG/2ML IV SOLN
INTRAVENOUS | Status: AC
  Filled 2017-11-03: qty 2

## 2017-11-03 MED ORDER — ACETAMINOPHEN 500 MG PO TABS
1000.0000 mg | ORAL_TABLET | Freq: Three times a day (TID) | ORAL | Status: AC
  Administered 2017-11-03 – 2017-11-04 (×4): 1000 mg via ORAL
  Filled 2017-11-03 (×4): qty 2

## 2017-11-03 MED ORDER — FENTANYL CITRATE (PF) 100 MCG/2ML IJ SOLN
INTRAMUSCULAR | Status: DC | PRN
  Administered 2017-11-03 (×2): 25 ug via INTRAVENOUS
  Administered 2017-11-03: 50 ug via INTRAVENOUS

## 2017-11-03 MED ORDER — VITAMIN B-1 100 MG PO TABS
100.0000 mg | ORAL_TABLET | Freq: Every day | ORAL | Status: DC
  Administered 2017-11-04 – 2017-11-05 (×2): 100 mg via ORAL
  Filled 2017-11-03: qty 1

## 2017-11-03 MED ORDER — MOMETASONE FURO-FORMOTEROL FUM 200-5 MCG/ACT IN AERO
2.0000 | INHALATION_SPRAY | Freq: Two times a day (BID) | RESPIRATORY_TRACT | Status: DC
  Administered 2017-11-03 – 2017-11-05 (×5): 2 via RESPIRATORY_TRACT
  Filled 2017-11-03 (×2): qty 8.8

## 2017-11-03 MED ORDER — METOCLOPRAMIDE HCL 5 MG/ML IJ SOLN: 5.0000 mg | Freq: Three times a day (TID) | INTRAMUSCULAR | Status: DC | PRN

## 2017-11-03 MED ORDER — MIDAZOLAM HCL 2 MG/2ML IJ SOLN
INTRAMUSCULAR | Status: AC
  Filled 2017-11-03: qty 2

## 2017-11-03 MED ORDER — FOLIC ACID 1 MG PO TABS
1.0000 mg | ORAL_TABLET | Freq: Every day | ORAL | Status: DC
  Administered 2017-11-04 – 2017-11-05 (×2): 1 mg via ORAL
  Filled 2017-11-03 (×2): qty 1

## 2017-11-03 MED ORDER — HYDROCODONE-ACETAMINOPHEN 5-325 MG PO TABS
1.0000 | ORAL_TABLET | ORAL | Status: DC | PRN
  Administered 2017-11-03: 1 via ORAL
  Filled 2017-11-03: qty 1

## 2017-11-03 MED ORDER — HYDROMORPHONE HCL 1 MG/ML IJ SOLN
0.5000 mg | INTRAMUSCULAR | Status: DC | PRN
  Administered 2017-11-03: 0.5 mg via INTRAVENOUS
  Filled 2017-11-03: qty 1

## 2017-11-03 MED ORDER — ONDANSETRON HCL 4 MG/2ML IJ SOLN
INTRAMUSCULAR | Status: AC
  Filled 2017-11-03: qty 2

## 2017-11-03 MED ORDER — ACETAMINOPHEN 10 MG/ML IV SOLN
INTRAVENOUS | Status: DC | PRN
  Administered 2017-11-03: 1000 mg via INTRAVENOUS

## 2017-11-03 MED ORDER — METOCLOPRAMIDE HCL 10 MG PO TABS: 5.0000 mg | ORAL_TABLET | Freq: Three times a day (TID) | ORAL | Status: DC | PRN

## 2017-11-03 MED ORDER — SODIUM CHLORIDE 0.9 % IV SOLN
Freq: Once | INTRAVENOUS | Status: AC
  Administered 2017-11-03: 04:00:00 via INTRAVENOUS

## 2017-11-03 MED ORDER — ONDANSETRON HCL 4 MG/2ML IJ SOLN
4.0000 mg | Freq: Once | INTRAMUSCULAR | Status: DC
  Filled 2017-11-03: qty 2

## 2017-11-03 MED ORDER — ALBUTEROL SULFATE (2.5 MG/3ML) 0.083% IN NEBU
INHALATION_SOLUTION | RESPIRATORY_TRACT | Status: AC
  Filled 2017-11-03: qty 3

## 2017-11-03 MED ORDER — SODIUM CHLORIDE 0.9 % IV BOLUS (SEPSIS)
1000.0000 mL | Freq: Once | INTRAVENOUS | Status: AC
  Administered 2017-11-03: 1000 mL via INTRAVENOUS

## 2017-11-03 MED ORDER — LIDOCAINE HCL (PF) 2 % IJ SOLN
INTRAMUSCULAR | Status: AC
  Filled 2017-11-03: qty 10

## 2017-11-03 MED ORDER — CEFAZOLIN SODIUM-DEXTROSE 1-4 GM/50ML-% IV SOLN
INTRAVENOUS | Status: DC | PRN
  Administered 2017-11-03: 1 g via INTRAVENOUS

## 2017-11-03 MED ORDER — BISACODYL 10 MG RE SUPP
10.0000 mg | Freq: Every day | RECTAL | Status: DC | PRN
  Administered 2017-11-05: 10 mg via RECTAL
  Filled 2017-11-03: qty 1

## 2017-11-03 MED ORDER — BISACODYL 5 MG PO TBEC: 5.0000 mg | DELAYED_RELEASE_TABLET | Freq: Every day | ORAL | Status: DC | PRN

## 2017-11-03 MED ORDER — DIPHENHYDRAMINE HCL 12.5 MG/5ML PO ELIX: 12.5000 mg | ORAL_SOLUTION | ORAL | Status: DC | PRN

## 2017-11-03 MED ORDER — ACETAMINOPHEN 650 MG RE SUPP: 650.0000 mg | Freq: Four times a day (QID) | RECTAL | Status: DC | PRN

## 2017-11-03 MED ORDER — DOCUSATE SODIUM 100 MG PO CAPS
100.0000 mg | ORAL_CAPSULE | Freq: Two times a day (BID) | ORAL | Status: DC
  Administered 2017-11-03 – 2017-11-05 (×4): 100 mg via ORAL
  Filled 2017-11-03 (×4): qty 1

## 2017-11-03 MED ORDER — SENNOSIDES-DOCUSATE SODIUM 8.6-50 MG PO TABS: 1.0000 | ORAL_TABLET | Freq: Every evening | ORAL | Status: DC | PRN

## 2017-11-03 MED ORDER — METHOCARBAMOL 1000 MG/10ML IJ SOLN
500.0000 mg | Freq: Four times a day (QID) | INTRAVENOUS | Status: DC | PRN
  Filled 2017-11-03: qty 5

## 2017-11-03 MED ORDER — SUCCINYLCHOLINE CHLORIDE 20 MG/ML IJ SOLN
INTRAMUSCULAR | Status: DC | PRN
  Administered 2017-11-03: 100 mg via INTRAVENOUS

## 2017-11-03 MED ORDER — LORAZEPAM 2 MG/ML IJ SOLN: 1.0000 mg | Freq: Four times a day (QID) | INTRAMUSCULAR | Status: DC | PRN

## 2017-11-03 MED ORDER — PHENYLEPHRINE HCL 10 MG/ML IJ SOLN
INTRAMUSCULAR | Status: DC | PRN
  Administered 2017-11-03 (×3): 100 ug via INTRAVENOUS

## 2017-11-03 MED ORDER — LORAZEPAM 1 MG PO TABS: 1.0000 mg | ORAL_TABLET | Freq: Four times a day (QID) | ORAL | Status: DC | PRN

## 2017-11-03 MED ORDER — CEFAZOLIN SODIUM-DEXTROSE 2-4 GM/100ML-% IV SOLN
2.0000 g | Freq: Four times a day (QID) | INTRAVENOUS | Status: AC
  Administered 2017-11-03 – 2017-11-04 (×3): 2 g via INTRAVENOUS
  Filled 2017-11-03 (×3): qty 100

## 2017-11-03 MED ORDER — VITAMIN B-1 100 MG PO TABS
100.0000 mg | ORAL_TABLET | Freq: Every day | ORAL | Status: DC
  Filled 2017-11-03: qty 1

## 2017-11-03 MED ORDER — TRAZODONE HCL 50 MG PO TABS: 25.0000 mg | ORAL_TABLET | Freq: Every evening | ORAL | Status: DC | PRN

## 2017-11-03 MED ORDER — ACETAMINOPHEN 325 MG PO TABS: 650.0000 mg | ORAL_TABLET | Freq: Four times a day (QID) | ORAL | Status: DC | PRN

## 2017-11-03 MED ORDER — BUPIVACAINE LIPOSOME 1.3 % IJ SUSP
INTRAMUSCULAR | Status: AC
  Filled 2017-11-03: qty 20

## 2017-11-03 MED ORDER — ADULT MULTIVITAMIN W/MINERALS CH
1.0000 | ORAL_TABLET | Freq: Every day | ORAL | Status: DC
  Administered 2017-11-04 – 2017-11-05 (×2): 1 via ORAL
  Filled 2017-11-03 (×2): qty 1

## 2017-11-03 MED ORDER — NEOMYCIN-POLYMYXIN B GU 40-200000 IR SOLN
Status: AC
  Filled 2017-11-03: qty 4

## 2017-11-03 MED ORDER — ACETAMINOPHEN 10 MG/ML IV SOLN
INTRAVENOUS | Status: AC
  Filled 2017-11-03: qty 100

## 2017-11-03 MED ORDER — ALBUTEROL SULFATE (2.5 MG/3ML) 0.083% IN NEBU
2.5000 mg | INHALATION_SOLUTION | RESPIRATORY_TRACT | Status: DC | PRN
  Administered 2017-11-03: 2.5 mg via RESPIRATORY_TRACT

## 2017-11-03 MED ORDER — MUPIROCIN 2 % EX OINT
1.0000 "application " | TOPICAL_OINTMENT | Freq: Two times a day (BID) | CUTANEOUS | Status: DC
  Administered 2017-11-03 – 2017-11-05 (×5): 1 via NASAL
  Filled 2017-11-03: qty 22

## 2017-11-03 MED ORDER — ONDANSETRON HCL 4 MG/2ML IJ SOLN: 4.0000 mg | Freq: Once | INTRAMUSCULAR | Status: DC | PRN

## 2017-11-03 MED ORDER — SUGAMMADEX SODIUM 200 MG/2ML IV SOLN
INTRAVENOUS | Status: DC | PRN
  Administered 2017-11-03: 90 mg via INTRAVENOUS

## 2017-11-03 MED ORDER — SODIUM CHLORIDE 0.9 % IV SOLN
INTRAVENOUS | Status: DC
  Administered 2017-11-03: 20:00:00 via INTRAVENOUS

## 2017-11-03 MED ORDER — PROPOFOL 10 MG/ML IV BOLUS
INTRAVENOUS | Status: AC
  Filled 2017-11-03: qty 20

## 2017-11-03 MED ORDER — KETAMINE HCL 50 MG/ML IJ SOLN
INTRAMUSCULAR | Status: AC
  Filled 2017-11-03: qty 10

## 2017-11-03 MED ORDER — OXYCODONE HCL 5 MG PO TABS: 10.0000 mg | ORAL_TABLET | ORAL | Status: DC | PRN

## 2017-11-03 MED ORDER — BUPIVACAINE LIPOSOME 1.3 % IJ SUSP
INTRAMUSCULAR | Status: DC | PRN
  Administered 2017-11-03: 20 mL

## 2017-11-03 MED ORDER — HYDROMORPHONE HCL 1 MG/ML IJ SOLN: 0.5000 mg | INTRAMUSCULAR | Status: DC | PRN

## 2017-11-03 MED ORDER — FLEET ENEMA 7-19 GM/118ML RE ENEM: 1.0000 | ENEMA | Freq: Once | RECTAL | Status: DC | PRN

## 2017-11-03 SURGICAL SUPPLY — 49 items
BIT DRILL AO GAMMA 4.2X180 (BIT) ×6 IMPLANT
BLADE SURG 15 STRL LF DISP TIS (BLADE) ×1 IMPLANT
BLADE SURG 15 STRL SS (BLADE) ×2
BNDG COHESIVE 6X5 TAN STRL LF (GAUZE/BANDAGES/DRESSINGS) ×3 IMPLANT
CANISTER SUCT 1200ML W/VALVE (MISCELLANEOUS) ×3 IMPLANT
CHLORAPREP W/TINT 26ML (MISCELLANEOUS) ×3 IMPLANT
DRAPE SHEET LG 3/4 BI-LAMINATE (DRAPES) ×3 IMPLANT
DRAPE SURG 17X11 SM STRL (DRAPES) ×6 IMPLANT
DRAPE U-SHAPE 47X51 STRL (DRAPES) ×3 IMPLANT
DRSG TEGADERM 4X4.75 (GAUZE/BANDAGES/DRESSINGS) ×9 IMPLANT
ELECT REM PT RETURN 9FT ADLT (ELECTROSURGICAL) ×3
ELECTRODE REM PT RTRN 9FT ADLT (ELECTROSURGICAL) ×1 IMPLANT
GAUZE SPONGE 4X4 12PLY STRL (GAUZE/BANDAGES/DRESSINGS) ×3 IMPLANT
GLOVE BIOGEL PI IND STRL 8 (GLOVE) ×1 IMPLANT
GLOVE BIOGEL PI INDICATOR 8 (GLOVE) ×2
GLOVE SURG SYN 7.5  E (GLOVE) ×2
GLOVE SURG SYN 7.5 E (GLOVE) ×1 IMPLANT
GOWN STRL REUS W/ TWL LRG LVL3 (GOWN DISPOSABLE) ×1 IMPLANT
GOWN STRL REUS W/ TWL XL LVL3 (GOWN DISPOSABLE) ×2 IMPLANT
GOWN STRL REUS W/TWL LRG LVL3 (GOWN DISPOSABLE) ×2
GOWN STRL REUS W/TWL XL LVL3 (GOWN DISPOSABLE) ×4
GUIDEROD T2 3X1000 (ROD) ×3 IMPLANT
HOLDER FOLEY CATH W/STRAP (MISCELLANEOUS) ×3 IMPLANT
K-WIRE  3.2X450M STR (WIRE) ×2
K-WIRE 3.2X450M STR (WIRE) ×1
KIT PATIENT CARE HANA TABLE (KITS) ×3 IMPLANT
KIT TURNOVER KIT A (KITS) ×3 IMPLANT
KWIRE 3.2X450M STR (WIRE) ×1 IMPLANT
MAT BLUE FLOOR 46X72 FLO (MISCELLANEOUS) ×6 IMPLANT
NAIL GAMMA LONG (Nail) ×3 IMPLANT
NEEDLE FILTER BLUNT 18X 1/2SAF (NEEDLE) ×2
NEEDLE FILTER BLUNT 18X1 1/2 (NEEDLE) ×1 IMPLANT
NEEDLE HYPO 22GX1.5 SAFETY (NEEDLE) ×3 IMPLANT
NS IRRIG 1000ML POUR BTL (IV SOLUTION) ×3 IMPLANT
PACK HIP COMPR (MISCELLANEOUS) ×3 IMPLANT
PAD ABD DERMACEA PRESS 5X9 (GAUZE/BANDAGES/DRESSINGS) ×6 IMPLANT
PENCIL ELECTRO HAND CTR (MISCELLANEOUS) ×3 IMPLANT
REAMER SHAFT BIXCUT (INSTRUMENTS) ×3 IMPLANT
SCREW LAG GAMMA 3 TI 10.5X90MM (Screw) ×3 IMPLANT
SCREW LOCKING T2 F/T  5X37.5MM (Screw) ×2 IMPLANT
SCREW LOCKING T2 F/T  5X42.5MM (Screw) ×2 IMPLANT
SCREW LOCKING T2 F/T 5X37.5MM (Screw) ×1 IMPLANT
SCREW LOCKING T2 F/T 5X42.5MM (Screw) ×1 IMPLANT
STAPLER SKIN PROX 35W (STAPLE) ×3 IMPLANT
SUT VIC AB 2-0 CT2 27 (SUTURE) ×3 IMPLANT
SYR 10ML LL (SYRINGE) ×3 IMPLANT
SYR 30ML LL (SYRINGE) ×3 IMPLANT
TAPE CLOTH 3X10 WHT NS LF (GAUZE/BANDAGES/DRESSINGS) ×3 IMPLANT
TRAY FOLEY CATH SILVER 16FR LF (SET/KITS/TRAYS/PACK) ×3 IMPLANT

## 2017-11-03 NOTE — ED Notes (Signed)
Pt's BP low.  Spoke with Admitting MD and instructed to hold narcotics at this time and to hand NS bolus and see if blood pressure improved.  Pt and daughter educated regarding this.  Verbalized understanding.

## 2017-11-03 NOTE — Op Note (Signed)
DATE OF SURGERY: 11/03/2017  PREOPERATIVE DIAGNOSIS: Right intertrochanteric hip fracture  POSTOPERATIVE DIAGNOSIS: Right intertrochanteric hip fracture  PROCEDURE: Intramedullary nailing of R femur with cephalomedullary device  SURGEON: Cato Mulligan, MD  ASSISTANTS: none  EBL: 200 cc  COMPONENTS:  Stryker Long Gamma Nail: 11x314mm; 28mm lag screw; 2 distal interolocking screws.   INDICATIONS: Lori Rangel is a 57 y.o. female who sustained an intertrochanteric fracture after a fall. Risks and benefits of intramedullary nailing were explained to the patient. Risks include but are not limited to bleeding, infection, injury to tissues, nerves, vessels, periprosthetic infection, dislocation, limb length discrepancy and risks of anesthesia. The patient understands these risks, has completed an informed consent, and wishes to proceed.   PROCEDURE:  The patient was brought into the operating room. After administering anesthesia, the patient was placed in the supine position on the Hana table. The uninjured leg was placed in an extended position while the injured lower extremity was placed in longitudinal traction. The fracture was reduced using longitudinal traction and internal rotation. The adequacy of reduction was verified fluoroscopically in AP and lateral projections and found to be acceptable. The lateral aspects of the right hip and thigh were prepped with ChloraPrep solution before being draped sterilely. Preoperative IV antibiotics were administered. A timeout was performed to verify the appropriate surgical site, patient, and procedure.   The greater trochanter was identified and an approximately 6 cm incision was made about 3 fingerbreadths above the tip of the greater trochanter. The incision was carried down through the subcutaneous tissues to expose the gluteal fascia. This was split the length of the incision, providing access to the tip of the trochanter. Under fluoroscopic  guidance, a guidewire was drilled through the tip of the trochanter into the proximal metaphysis to the level of the lesser trochanter. After verifying its position fluoroscopically in AP and lateral projections, it was overreamed with the opening reamer to the level of the lesser trochanter. A guidewire was passed down through the femoral canal to the supracondylar region. The adequacy of guidewire position was verified fluoroscopically in AP and lateral projections before the length of the guidewire within the canal was measured and a nail of appropriate length was selected. The guidewire was overreamed sequentially using the flexible reamers, beginning with an 11 mm reamer and progressing to a 13 mm reamer. This provided good cortical chatter. The Stryker Gamma Nail was selected and advanced to the appropriate depth as verified fluoroscopically.   The guide system for the lag screw was positioned and advanced through an approximately 4 cm stab incision over the lateral aspect of the proximal femur. The guidewire was drilled up through the femoral nail and into the femoral neck to rest within 5 mm of subchondral bone. After verifying its position in the femoral neck and head in both AP and lateral projections, the guidewire was measured and appropriate sized lag screw was selected. The guidewire was overreamed to the appropriate depth before the lag screw was inserted and advanced to the appropriate depth as verified fluoroscopically in AP and lateral projections. The lag screw was advanced, and the fracture was compressed as needed. The set screw was tightened and then untightened a quarter turn to allow for compression. Again, the adequacy of hardware position and fracture reduction was verified fluoroscopically in AP and lateral projections.  Attention was directed distally. Using the "perfect circle" technique, the leg and fluoroscopy machine were positioned appropriately. An approximate 3cm incision was  made over the  skin and IT band at the appropriate point before the drill bit was advanced through the cortex and across the static and oblong holes of the nail. Appropriate screw lengths were determined with a measuring guide. Two distal interlocking screws were placed. Again the adequacy of screw position was verified fluoroscopically in AP and lateral projections.  The wounds were irrigated thoroughly with sterile saline solution. Deep fascia of the IT band was closed with 0-Vicryl. The subcutaneous tissues were closed using 2-0 Vicryl interrupted sutures. The skin was closed using staples. Sterile occlusive dressings were applied to all wounds. The patient was then transferred to the recovery room in satisfactory condition after tolerating the procedure well.  POSTOPERATIVE PLAN: The patient will be WBAT on the operative extremity. Lovenox 40mg /day x 4 weeks to start on POD#1. Ancef x 24 hours. PT/OT on POD#1.

## 2017-11-03 NOTE — Clinical Social Work Note (Signed)
Clinical Social Work Assessment  Patient Details  Name: Lori Rangel MRN: [id number] Date of Birth: 1961-08-14  Date of referral:  11/03/17               Reason for consult:  Facility Placement                Permission sought to share information with:    Permission granted to share information::     Name::        Agency::     Relationship::     Contact Information:     Housing/Transportation Living arrangements for the past 2 months:  Single Family Home Source of Information:  Patient, Adult Children Patient Interpreter Needed:  None Criminal Activity/Legal Involvement Pertinent to Current Situation/Hospitalization:  No - Comment as needed Significant Relationships:  Adult Children Lives with:  Self Do you feel safe going back to the place where you live?  Yes Need for family participation in patient care:  Yes (Comment)  Care giving concerns:  Patient lives in [place], [place] alone.    Social Worker assessment / plan:  Holiday representative (CSW) reviewed chart and noted that patient will have surgery for a hip fracture today. CSW met with patient and her daughter Juliann Pulse [phone] was at bedside. CSW introduced self and explained role of CSW department. Patient reported that she lives alone, is on disability and has no health insurance. Patient reported she receives $1,300 per month in disability but is not eligible for medicaid because she makes too much. Patient reported that she is not eligible for medicare for 24 months. CSW explained that after surgery PT will evaluate patient and make recommendation for home health or SNF. explained that patient's options will be limited because she has no insurance. Patient reported that she is hopeful that she can go home after surgery. Patient reported that she has good family support and her sister lives near her. Patient reported that she is not doing chemo or radiation for her lung cancer at this time. Patient reported that she may  undergo surgery and not need chemo or radiation. RN case manager aware of above. CSW will continue to follow and assist as needed.   Employment status:  Retired Forensic scientist:  Self Pay (Medicaid Pending) PT Recommendations:  Not assessed at this time Information / Referral to community resources:  Other (Comment Required)(Home Health VS. SNF )  Patient/Family's Response to care:  Patient is hopeful to go home after surgery.   Patient/Family's Understanding of and Emotional Response to Diagnosis, Current Treatment, and Prognosis:  Patient and her daughter were very pleasant and thanked CSW for assistance.   Emotional Assessment Appearance:  Appears stated age Attitude/Demeanor/Rapport:    Affect (typically observed):  Accepting, Adaptable, Quiet Orientation:  Oriented to Self, Oriented to Place, Oriented to  Time, Oriented to Situation Alcohol / Substance use:  Alcohol Use Psych involvement (Current and /or in the community):  No (Comment)  Discharge Needs  Concerns to be addressed:  Discharge Planning Concerns Readmission within the last 30 days:  No Current discharge risk:  Dependent with Mobility Barriers to Discharge:  Continued Medical Work up   [employer], Veronia Beets, LCSW 11/03/2017, 3:58 PM

## 2017-11-03 NOTE — NC FL2 (Signed)
[place] LEVEL OF CARE SCREENING TOOL     IDENTIFICATION  Patient Name: Lori Rangel Birthdate: 1960-11-17 Sex: female Admission Date (Current Location): 11/03/2017  [place] and [place] Number:  Engineering geologist and Address:  [facility], [address] [place], [place] [address]      Provider Number: [id number]  Attending Physician Name and Address:  Gorden Harms, MD  Relative Name and Phone Number:       Current Level of Care: Hospital Recommended Level of Care: [place] Prior Approval Number:    Date Approved/Denied:   PASRR Number: [id number])  Discharge Plan: SNF    Current Diagnoses: Patient Active Problem List   Diagnosis Date Noted  . Closed right hip fracture ([place]) 11/03/2017  . Lung mass   . Carotid stenosis 12/31/2015  . PAD (peripheral artery disease) ([place]) 12/31/2015  . Smoker 12/31/2015  . Pre-operative clearance 12/31/2015  . Lung nodule 12/31/2015  . Protein-calorie malnutrition, severe 12/17/2015  . Pneumonia 12/15/2015    Orientation RESPIRATION BLADDER Height & Weight     Self, Time, Situation, Place  O2(2 Liters Oxygen. ) Continent Weight: 100 lb 3.2 oz (45.5 kg) Height:  5\' 2"  (157.5 cm)  BEHAVIORAL SYMPTOMS/MOOD NEUROLOGICAL BOWEL NUTRITION STATUS      Continent Diet(NPO for surgery to be advanced. )  AMBULATORY STATUS COMMUNICATION OF NEEDS Skin   Extensive Assist Verbally Surgical wounds                       Personal Care Assistance Level of Assistance  Bathing, Feeding, Dressing Bathing Assistance: Limited assistance Feeding assistance: Independent Dressing Assistance: Limited assistance     Functional Limitations Info  Sight, Hearing, Speech Sight Info: Adequate Hearing Info: Adequate Speech Info: Adequate    SPECIAL CARE FACTORS FREQUENCY  PT (By licensed PT), OT (By licensed OT)     PT Frequency: (5) OT Frequency: (5)             Contractures      Additional Factors Info  Code Status, Allergies Code Status Info: (Full Code. ) Allergies Info: (Levaquin Levofloxacin)           Current Medications (11/03/2017):  This is the current hospital active medication list Current Facility-Administered Medications  Medication Dose Route Frequency Provider Last Rate Last Dose  . acetaminophen (TYLENOL) tablet 650 mg  650 mg Oral Q6H PRN Amelia Jo, MD       Or  . acetaminophen (TYLENOL) suppository 650 mg  650 mg Rectal Q6H PRN Amelia Jo, MD      . albuterol (PROVENTIL) (2.5 MG/3ML) 0.083% nebulizer solution 2.5 mg  2.5 mg Inhalation Q4H PRN Amelia Jo, MD      . bisacodyl (DULCOLAX) EC tablet 5 mg  5 mg Oral Daily PRN Amelia Jo, MD      . Chlorhexidine Gluconate Cloth 2 % PADS 6 each  6 each Topical Daily Salary, Avel Peace, MD   6 each at 11/03/17 [phone]  . docusate sodium (COLACE) capsule 100 mg  100 mg Oral BID Amelia Jo, MD      . folic acid (FOLVITE) tablet 1 mg  1 mg Oral Daily Amelia Jo, MD      . HYDROcodone-acetaminophen (NORCO/VICODIN) 5-325 MG per tablet 1-2 tablet  1-2 tablet Oral Q4H PRN Amelia Jo, MD   1 tablet at 11/03/17 [phone]  . HYDROmorphone (DILAUDID) injection 1 mg  1 mg Intravenous Q2H PRN Salary,  Montell D, MD   1 mg at 11/03/17 1314  . LORazepam (ATIVAN) tablet 1 mg  1 mg Oral Q6H PRN Amelia Jo, MD       Or  . LORazepam (ATIVAN) injection 1 mg  1 mg Intravenous Q6H PRN Amelia Jo, MD      . MEDLINE mouth rinse  15 mL Mouth Rinse BID Amelia Jo, MD   15 mL at 11/03/17 1005  . mometasone-formoterol (DULERA) 200-5 MCG/ACT inhaler 2 puff  2 puff Inhalation BID Amelia Jo, MD   2 puff at 11/03/17 1005  . multivitamin with minerals tablet 1 tablet  1 tablet Oral Daily Amelia Jo, MD      . mupirocin ointment (BACTROBAN) 2 % 1 application  1 application Nasal BID Salary, Avel Peace, MD   1 application at 43/15/40 1005  . ondansetron (ZOFRAN) tablet 4 mg  4 mg Oral Q6H  PRN Amelia Jo, MD       Or  . ondansetron [facility]) injection 4 mg  4 mg Intravenous Q6H PRN Amelia Jo, MD      . thiamine (VITAMIN B-1) tablet 100 mg  100 mg Oral Daily Amelia Jo, MD       Or  . thiamine (B-1) injection 100 mg  100 mg Intravenous Daily Amelia Jo, MD      . thiamine (VITAMIN B-1) tablet 100 mg  100 mg Oral Daily Amelia Jo, MD      . traZODone (DESYREL) tablet 25 mg  25 mg Oral QHS PRN Amelia Jo, MD         Discharge Medications: Please see discharge summary for a list of discharge medications.  Relevant Imaging Results:  Relevant Lab Results:   Additional Information (SSN: [id number])  Kahliyah Dick, Veronia Beets, LCSW

## 2017-11-03 NOTE — H&P (Signed)
Paper H&P to be scanned into permanent record. H&P reviewed. No significant changes noted.  

## 2017-11-03 NOTE — Anesthesia Post-op Follow-up Note (Signed)
Anesthesia QCDR form completed.        

## 2017-11-03 NOTE — ED Notes (Signed)
transport patient to floor room 151.AS

## 2017-11-03 NOTE — ED Provider Notes (Signed)
[facility] Emergency Department Provider Note  ____________________________________________   First MD Initiated Contact with Patient 11/03/17 0018     (approximate)  I have reviewed the triage vital signs and the nursing notes.   HISTORY  Chief Complaint Hip Pain   HPI Lori Rangel is a 57 y.o. female who comes to the emergency department by EMS with sudden onset severe right hip pain that began roughly 3 hours prior to arrival when she had a mechanical fall in her kitchen.  She slipped and fell falling onto her right hip.  She initially got up and ambulated to bed however when she got up again to try to go to the bathroom the pain became so severe she called 911.  The pain is in her right hip radiates towards her right groin.  Worse with ambulation improved with rest.  Past Medical History:  Diagnosis Date  . Asthma   . Cancer ([place])   . COPD (chronic obstructive pulmonary disease) ([place])   . Shortness of breath dyspnea   . Weight decrease major recent weight loss    Patient Active Problem List   Diagnosis Date Noted  . Closed right hip fracture ([place]) 11/03/2017  . Lung mass   . Carotid stenosis 12/31/2015  . PAD (peripheral artery disease) ([place]) 12/31/2015  . Smoker 12/31/2015  . Pre-operative clearance 12/31/2015  . Lung nodule 12/31/2015  . Protein-calorie malnutrition, severe 12/17/2015  . Pneumonia 12/15/2015    Past Surgical History:  Procedure Laterality Date  . BREAST BIOPSY Right   . COLONOSCOPY    . ELECTROMAGNETIC NAVIGATION BROCHOSCOPY N/A 01/02/2016   Procedure: ELECTROMAGNETIC NAVIGATION BRONCHOSCOPY;  Surgeon: Flora Lipps, MD;  Location: [facility] ORS;  Service: Cardiopulmonary;  Laterality: N/A;    Prior to Admission medications   Medication Sig Start Date End Date Taking? Authorizing Provider  aspirin EC 81 MG tablet Take 81 mg by mouth daily.   Yes [provider]  fluticasone (FLOVENT HFA) 110 MCG/ACT inhaler  Inhale 2 puffs into the lungs 2 (two) times daily. 01/31/17 01/31/18 Yes [provider]  mirtazapine (REMERON SOL-TAB) 30 MG disintegrating tablet Take 30 mg by mouth. Pt takes 1/4th tablet by mouth daily 04/18/17  Yes [provider]  VENTOLIN HFA 108 (90 Base) MCG/ACT inhaler INHALE 2 PUFFS INTO LUNGS EVERY 6 HOURS AS NEEDED FOR WHEEZING OR SHORTNESS OF BREATH 10/14/16  Yes Laverle Hobby, MD  budesonide-formoterol (SYMBICORT) 160-4.5 MCG/ACT inhaler Inhale 2 puffs into the lungs 2 (two) times daily. 12/21/15 01/02/16  Laverle Hobby, MD  guaiFENesin (ROBITUSSIN) 100 MG/5ML SOLN Take 5 mLs (100 mg total) by mouth every 4 (four) hours as needed for cough or to loosen phlegm. Patient not taking: Reported on 11/03/2017 12/17/15   Henreitta Leber, MD  megestrol (MEGACE) 400 MG/10ML suspension Take 10 mLs (400 mg total) by mouth 2 (two) times daily. Patient not taking: Reported on 11/03/2017 01/18/16   Laverle Hobby, MD  varenicline (CHANTIX CONTINUING MONTH PAK) 1 MG tablet Take 1 tablet (1 mg total) by mouth 2 (two) times daily. Patient not taking: Reported on 11/03/2017 12/29/15   Minna Merritts, MD    Allergies Levaquin [levofloxacin]  Family History  Problem Relation Age of Onset  . Cirrhosis Father     Social History Social History   Tobacco Use  . Smoking status: Current Every Day Smoker    Packs/day: 0.75    Years: 40.00    Pack years: 30.00    Types:  Cigarettes  . Smokeless tobacco: Never Used  Substance Use Topics  . Alcohol use: Yes    Alcohol/week: 0.0 oz    Comment: 5 beers daily  . Drug use: No    Review of Systems Constitutional: No fever/chills Eyes: No visual changes. ENT: No sore throat. Cardiovascular: Denies chest pain. Respiratory: Denies shortness of breath. Gastrointestinal: No abdominal pain.  No nausea, no vomiting.  No diarrhea.  No constipation. Genitourinary: Negative for dysuria. Musculoskeletal: Negative for back  pain. Skin: Negative for rash. Neurological: Negative for headaches, focal weakness or numbness.   ____________________________________________   PHYSICAL EXAM:  VITAL SIGNS: ED Triage Vitals  Enc Vitals Group     BP      Pulse      Resp      Temp      Temp src      SpO2      Weight      Height      Head Circumference      Peak Flow      Pain Score      Pain Loc      Pain Edu?      Excl. in [place]?     Constitutional: Alert and oriented x4 appears extremely uncomfortable tearful Eyes: PERRL EOMI. Head: Atraumatic. Nose: No congestion/rhinnorhea. Mouth/Throat: No trismus Neck: No stridor.   Cardiovascular: Normal rate, regular rhythm. Grossly normal heart sounds.  Good peripheral circulation. Respiratory: Normal respiratory effort.  No retractions. Lungs CTAB and moving good air Gastrointestinal: Soft nontender Musculoskeletal: Right leg shortened and externally rotated.  2+ dorsalis pedis pulse.  Exquisite discomfort when ranging hip. Neurologic:  Normal speech and language. No gross focal neurologic deficits are appreciated. Skin:  Skin is warm, dry and intact. No rash noted. Psychiatric: Mood and affect are normal. Speech and behavior are normal.    ____________________________________________   DIFFERENTIAL includes but not limited to  Hip dislocation, hip fracture, mechanical fall, syncope ____________________________________________   LABS (all labs ordered are listed, but only abnormal results are displayed)  Labs Reviewed  COMPREHENSIVE METABOLIC PANEL - Abnormal; Notable for the following components:      Result Value   Sodium 123 (*)    Chloride 89 (*)    CO2 20 (*)    Glucose, Bld 107 (*)    BUN <5 (*)    Creatinine, Ser 0.40 (*)    Calcium 7.9 (*)    Albumin 2.9 (*)    AST 55 (*)    Alkaline Phosphatase 220 (*)    All other components within normal limits  CBC WITH DIFFERENTIAL/PLATELET - Abnormal; Notable for the following components:    RBC 3.47 (*)    MCV 106.6 (*)    MCH 37.7 (*)    Neutro Abs 7.1 (*)    Lymphs Abs 0.6 (*)    All other components within normal limits  ETHANOL - Abnormal; Notable for the following components:   Alcohol, Ethyl (B) 234 (*)    All other components within normal limits  PROTIME-INR  TYPE AND SCREEN  TYPE AND SCREEN    Lab work reviewed by me shows a number of abnormalities including significantly elevated ethanol level, high MCV raising concern for chronic alcohol abuse, low albumin concerning for chronic malnutrition, hypochloremic hyponatremia __________________________________________  EKG  ED ECG REPORT I, Darel Hong, the attending physician, personally viewed and interpreted this ECG.  Date: 11/03/2017 EKG Time:  Rate: 69 Rhythm: normal sinus rhythm QRS Axis: normal Intervals: normal  ST/T Wave abnormalities: normal Narrative Interpretation: no evidence of acute ischemia  ____________________________________________  RADIOLOGY  Hip x-ray reviewed by me shows minimally displaced intertrochanteric fracture Chest x-ray reviewed by me shows no acute disease ____________________________________________   PROCEDURES  Procedure(s) performed: no  Procedures  Critical Care performed: no  Observation: no ____________________________________________   INITIAL IMPRESSION / ASSESSMENT AND PLAN / ED COURSE  Pertinent labs & imaging results that were available during my care of the patient were reviewed by me and considered in my medical decision making (see chart for details).  On arrival the patient has significant right hip pain with shortening and external rotation raising concern for fracture.  IV fentanyl given for pain control x-rays are pending.  I do anticipate inpatient admission so I will add on labs and a chest x-ray and EKG.  [name] x-ray of her hip shows a minimally displaced intertrochanteric hip fracture.  She last ate at 6 PM although she last  drank beer at 11 PM.  She takes no blood thinning medication although does take aspirin.  I discussed with on-call orthopedic surgeon Dr. Posey Pronto who recommends inpatient admission to the hospitalist service given the patient's multiple comorbidities and he will kindly consult on the patient.  Anticipate surgery in the morning.  I then discussed with the patient and family who verbalized understanding and agreement with the plan.  I then discussed with the hospitalist who has graciously agreed to admit the patient to her service.      ____________________________________________   FINAL CLINICAL IMPRESSION(S) / ED DIAGNOSES  Final diagnoses:  Closed displaced intertrochanteric fracture of right femur, initial encounter ([place])  Alcoholic intoxication with complication ([place])  Hyponatremia  Alcohol abuse      NEW MEDICATIONS STARTED DURING THIS VISIT:  New Prescriptions   No medications on file     Note:  This document was prepared using Dragon voice recognition software and may include unintentional dictation errors.     Darel Hong, MD 11/03/17 [phone]

## 2017-11-03 NOTE — Progress Notes (Signed)
1. Right intertrochanteric fracture.  Ortho consulted for surgical repair.  Based on her functional capacity and normal EKG, patient is at low surgical risk for this orthopedic procedure.  2.  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  Patient admits to drinking at least 5 beers every day.  Her alcohol level is currently elevated at 234.  I discussed alcohol cessation with the patient and her daughter.  We will start IV fluids for now and continue to monitor sodium level closely. 3.  Recurrent lung cancer.  She is to follow-up with oncology, pulmonary and thoracic surgery for possible surgical resection in the near future. 4.  Alcohol abuse, will monitor for withdrawal symptoms.  We will start CIWA protocol. 5.  Tobacco abuse.  Smoking cessation was discussed with patient in detail.  6.  COPD stable, continue home maintenance medications.   Plan of care per above

## 2017-11-03 NOTE — Progress Notes (Signed)
Patient continues with severe pain even after PO meds. Per MD Salary, increase dilaudid to 1 mg Q2hrs PRN Order placed

## 2017-11-03 NOTE — Anesthesia Procedure Notes (Signed)
Procedure Name: Intubation Date/Time: 11/03/2017 5:00 PM Performed by: Aline Brochure, CRNA Pre-anesthesia Checklist: Patient identified, Emergency Drugs available, Suction available and Patient being monitored Patient Re-evaluated:Patient Re-evaluated prior to induction Oxygen Delivery Method: Circle system utilized Preoxygenation: Pre-oxygenation with 100% oxygen Induction Type: IV induction Ventilation: Mask ventilation without difficulty Laryngoscope Size: Mac and 3 Grade View: Grade II Tube type: Oral Tube size: 7.0 mm Number of attempts: 1 Airway Equipment and Method: Stylet Placement Confirmation: positive ETCO2 and breath sounds checked- equal and bilateral Secured at: 21 cm Tube secured with: Tape

## 2017-11-03 NOTE — ED Triage Notes (Signed)
Pt brought in from home from [facility] after falling at home and having R hip pain.  Pt has shortening and rotation noted to R leg.  Pt is A&Ox4, in NAD.

## 2017-11-03 NOTE — Anesthesia Preprocedure Evaluation (Signed)
Anesthesia Evaluation  Patient identified by MRN, date of birth, ID band Patient awake    Reviewed: Allergy & Precautions, H&P , NPO status , Patient's Chart, lab work & pertinent test results, reviewed documented beta blocker date and time   Airway Mallampati: II   Neck ROM: full    Dental  (+) Poor Dentition   Pulmonary neg pulmonary ROS, shortness of breath and with exertion, asthma , pneumonia, COPD, Current Smoker,  Hx of lung Cancer   Pulmonary exam normal        Cardiovascular Exercise Tolerance: Poor + Peripheral Vascular Disease  negative cardio ROS Normal cardiovascular exam Rhythm:regular Rate:Normal     Neuro/Psych negative neurological ROS  negative psych ROS   GI/Hepatic negative GI ROS, Neg liver ROS,   Endo/Other  negative endocrine ROS  Renal/GU negative Renal ROS  negative genitourinary   Musculoskeletal   Abdominal   Peds  Hematology negative hematology ROS (+)   Anesthesia Other Findings Past Medical History: No date: Asthma No date: Cancer ([place]) No date: COPD (chronic obstructive pulmonary disease) ([facility]) No date: Shortness of breath dyspnea major recent weight loss: Weight decrease Past Surgical History: No date: BREAST BIOPSY; Right No date: COLONOSCOPY 01/02/2016: ELECTROMAGNETIC NAVIGATION BROCHOSCOPY; N/A     Comment:  Procedure: ELECTROMAGNETIC NAVIGATION BRONCHOSCOPY;                Surgeon: Flora Lipps, MD;  Location: [facility] ORS;  Service:               Cardiopulmonary;  Laterality: N/A; BMI    Body Mass Index:  18.33 kg/m     Reproductive/Obstetrics negative OB ROS                             Anesthesia Physical Anesthesia Plan  ASA: IV and emergent  Anesthesia Plan: General and Spinal   Post-op Pain Management:    Induction:   PONV Risk Score and Plan: 3  Airway Management Planned:   Additional Equipment:   Intra-op Plan:    Post-operative Plan:   Informed Consent: I have reviewed the patients History and Physical, chart, labs and discussed the procedure including the risks, benefits and alternatives for the proposed anesthesia with the patient or authorized representative who has indicated his/her understanding and acceptance.   Dental Advisory Given  Plan Discussed with: CRNA  Anesthesia Plan Comments:         Anesthesia Quick Evaluation

## 2017-11-03 NOTE — ED Notes (Signed)
Pt reported to this RN that she had 1.5 beers prior to bed time and that she was going to bed when she fell.

## 2017-11-03 NOTE — Transfer of Care (Signed)
Immediate Anesthesia Transfer of Care Note  Patient: Lori Rangel  Procedure(s) Performed: INTRAMEDULLARY (IM) NAIL INTERTROCHANTRIC (Right )  Patient Location: PACU  Anesthesia Type:General  Level of Consciousness: awake  Airway & Oxygen Therapy: Patient connected to face mask oxygen  Post-op Assessment: Post -op Vital signs reviewed and stable  Post vital signs: stable  Last Vitals:  Vitals:   11/03/17 1855 11/03/17 1856  BP: (!) (P) 163/78 (!) 163/78  Pulse:  (!) 117  Resp:  (!) 21  Temp: (!) (P) 36.2 C   SpO2:  99%    Last Pain:  Vitals:   11/03/17 1314  TempSrc:   PainSc: 7       Patients Stated Pain Goal: 4 (02/23/75 2831)  Complications: No apparent anesthesia complications

## 2017-11-03 NOTE — H&P (Signed)
[facility] NAME: Lori Rangel    MR#:  [id number]  DATE OF BIRTH:  1961-08-11  DATE OF ADMISSION:  11/03/2017  PRIMARY CARE PHYSICIAN: Kirk Ruths, MD   REQUESTING/REFERRING PHYSICIAN:   CHIEF COMPLAINT:   Chief Complaint  Patient presents with  . Hip Pain    HISTORY OF PRESENT ILLNESS: Lori Rangel  is a 57 y.o. female with a known history of COPD, PAD, tobacco and alcohol abuse, and most recently diagnosed recurrent lung cancer.  Patient was initially diagnosed with lung cancer in 2017 and she underwent removal of the small lesion by CyberKnife.  At the recent follow-up, she was told that the lung mass came back in the same spot.  This time she is recommended lobectomy, which was not scheduled yet, patient has to undergo some more testing. Patient was brought to emergency room this time, for right hip fracture status post mechanical fall at home.  She is usually very active and able to ambulate more than 400 feet without any chest pain.  Blood work done in the emergency room was remarkable for elevated alcohol level at 234 and low sodium level at 123. EKG reviewed by myself, shows normal sinus rhythm with heart rate at 69; no acute changes. Chest x-ray, reviewed by myself shows apparent interval increase in size of a left upper lobe lung nodule, but no acute infiltrate or effusion.  Pelvis x-ray shows acute, slightly comminuted displaced and angulated right intertrochanteric fracture. Patient is admitted for right hip fracture surgical repair.  PAST MEDICAL HISTORY:   Past Medical History:  Diagnosis Date  . Asthma   . Cancer ([place])   . COPD (chronic obstructive pulmonary disease) ([place])   . Shortness of breath dyspnea   . Weight decrease major recent weight loss    PAST SURGICAL HISTORY:  Past Surgical History:  Procedure Laterality Date  . BREAST BIOPSY Right   . COLONOSCOPY    . ELECTROMAGNETIC  NAVIGATION BROCHOSCOPY N/A 01/02/2016   Procedure: ELECTROMAGNETIC NAVIGATION BRONCHOSCOPY;  Surgeon: Flora Lipps, MD;  Location: [facility] ORS;  Service: Cardiopulmonary;  Laterality: N/A;    SOCIAL HISTORY:  Social History   Tobacco Use  . Smoking status: Current Every Day Smoker    Packs/day: 0.75    Years: 40.00    Pack years: 30.00    Types: Cigarettes  . Smokeless tobacco: Never Used  Substance Use Topics  . Alcohol use: Yes    Alcohol/week: 0.0 oz    Comment: 5 beers daily    FAMILY HISTORY:  Family History  Problem Relation Age of Onset  . Cirrhosis Father     DRUG ALLERGIES:  Allergies  Allergen Reactions  . Levaquin [Levofloxacin] Nausea Only    Cramps & Body aches    REVIEW OF SYSTEMS:   CONSTITUTIONAL: No fever, fatigue or weakness.  EYES: No blurred or double vision.  EARS, NOSE, AND THROAT: No tinnitus or ear pain.  RESPIRATORY: No cough, shortness of breath, wheezing or hemoptysis.  CARDIOVASCULAR: No chest pain, orthopnea, edema.  GASTROINTESTINAL: No nausea, vomiting, diarrhea or abdominal pain.  GENITOURINARY: No dysuria, hematuria.  ENDOCRINE: No polyuria, nocturia,  HEMATOLOGY: No anemia, easy bruising or bleeding SKIN: No rash or lesion. MUSCULOSKELETAL: Positive for right hip pain.   NEUROLOGIC: No focal weakness.  PSYCHIATRY: No anxiety or depression.   MEDICATIONS AT HOME:  Prior to Admission medications   Medication Sig Start Date End Date Taking? Authorizing Provider  aspirin EC 81 MG tablet Take 81 mg by mouth daily.   Yes [provider]  fluticasone (FLOVENT HFA) 110 MCG/ACT inhaler Inhale 2 puffs into the lungs 2 (two) times daily. 01/31/17 01/31/18 Yes [provider]  mirtazapine (REMERON SOL-TAB) 30 MG disintegrating tablet Take 30 mg by mouth. Pt takes 1/4th tablet by mouth daily 04/18/17  Yes [provider]  VENTOLIN HFA 108 (90 Base) MCG/ACT inhaler INHALE 2 PUFFS INTO LUNGS EVERY 6 HOURS AS NEEDED FOR WHEEZING  OR SHORTNESS OF BREATH 10/14/16  Yes Laverle Hobby, MD  budesonide-formoterol (SYMBICORT) 160-4.5 MCG/ACT inhaler Inhale 2 puffs into the lungs 2 (two) times daily. 12/21/15 01/02/16  Laverle Hobby, MD  guaiFENesin (ROBITUSSIN) 100 MG/5ML SOLN Take 5 mLs (100 mg total) by mouth every 4 (four) hours as needed for cough or to loosen phlegm. Patient not taking: Reported on 11/03/2017 12/17/15   Henreitta Leber, MD  megestrol (MEGACE) 400 MG/10ML suspension Take 10 mLs (400 mg total) by mouth 2 (two) times daily. Patient not taking: Reported on 11/03/2017 01/18/16   Laverle Hobby, MD  varenicline (CHANTIX CONTINUING MONTH PAK) 1 MG tablet Take 1 tablet (1 mg total) by mouth 2 (two) times daily. Patient not taking: Reported on 11/03/2017 12/29/15   Minna Merritts, MD      PHYSICAL EXAMINATION:   VITAL SIGNS: Blood pressure 116/70, pulse 76, temperature 97.8 F (36.6 C), temperature source Oral, resp. rate 18, height 5\' 2"  (1.575 m), weight 42.2 kg (93 lb), SpO2 99 %.  GENERAL:  57 y.o.-year-old patient lying in the bed, in moderate distress, secondary to right hip pain.  EYES: Pupils equal, round, reactive to light and accommodation. No scleral icterus. Extraocular muscles intact.  HEENT: Head atraumatic, normocephalic. Oropharynx and nasopharynx clear.  NECK:  Supple, no jugular venous distention. No thyroid enlargement, no tenderness.  LUNGS: Reduced breath sounds bilaterally, no wheezing, rales,rhonchi or crepitation. No use of accessory muscles of respiration.  CARDIOVASCULAR: S1, S2 normal. No S3/S4.  Reduced pedal pulses bilaterally. ABDOMEN: Soft, nontender, nondistended. Bowel sounds present. No organomegaly or mass.  EXTREMITIES: No pedal edema, cyanosis, or clubbing.  NEUROLOGIC: No focal weakness.  Gait not checked, due to right hip pain.  PSYCHIATRIC: The patient is alert and oriented x 3.  SKIN: No obvious rash, lesion, or ulcer.   LABORATORY PANEL:   CBC Recent  Labs  Lab 11/03/17 0102  WBC 8.1  HGB 13.1  HCT 37.1  PLT 347  MCV 106.6*  MCH 37.7*  MCHC 35.4  RDW 13.6  LYMPHSABS 0.6*  MONOABS 0.3  EOSABS 0.0  BASOSABS 0.0   ------------------------------------------------------------------------------------------------------------------  Chemistries  Recent Labs  Lab 11/03/17 0102  NA 123*  K 3.8  CL 89*  CO2 20*  GLUCOSE 107*  BUN <5*  CREATININE 0.40*  CALCIUM 7.9*  AST 55*  ALT 29  ALKPHOS 220*  BILITOT 0.7   ------------------------------------------------------------------------------------------------------------------ estimated creatinine clearance is 51.1 mL/min (A) (by C-G formula based on SCr of 0.4 mg/dL (L)). ------------------------------------------------------------------------------------------------------------------ No results for input(s): TSH, T4TOTAL, T3FREE, THYROIDAB in the last 72 hours.  Invalid input(s): FREET3   Coagulation profile Recent Labs  Lab 11/03/17 0102  INR 0.96   ------------------------------------------------------------------------------------------------------------------- No results for input(s): DDIMER in the last 72 hours. -------------------------------------------------------------------------------------------------------------------  Cardiac Enzymes No results for input(s): CKMB, TROPONINI, MYOGLOBIN in the last 168 hours.  Invalid input(s): CK ------------------------------------------------------------------------------------------------------------------ Invalid input(s): POCBNP  ---------------------------------------------------------------------------------------------------------------  Urinalysis No results found for: COLORURINE, APPEARANCEUR, [place], [place], [place], [place], [place],  [place]errill Memo, UROBILINOGEN, NITRITE, LEUKOCYTESUR   RADIOLOGY: Dg Chest Port 1 View  Result Date: 11/03/2017 CLINICAL DATA:  Fall with hip fracture EXAM:  PORTABLE CHEST 1 VIEW COMPARISON:  12/15/2015 FINDINGS: Bilateral axillary vascular calcification. Increased size of a left upper lobe lung nodule, measuring approximately 2 cm in size. Metallic densities are noted in the region. Heart size within normal limits. Aortic atherosclerosis. No pneumothorax. IMPRESSION: 1. Negative for acute infiltrate or effusion 2. Apparent interval increase in size of a left upper lobe lung nodule. Follow-up CT chest recommended. Electronically Signed   By: Donavan Foil M.D.   On: 11/03/2017 01:07   Dg Hip Unilat With Pelvis 2-3 Views Right  Result Date: 11/03/2017 CLINICAL DATA:  Fall with hip pain EXAM: DG HIP (WITH OR WITHOUT PELVIS) 2-3V RIGHT COMPARISON:  None FINDINGS: Pubic symphysis and rami are intact. The left femoral head projects in joint. The SI joints show mild degenerative change but are non widened. Acute displaced intertrochanteric fracture of the proximal right femur with cephalad migration of the trochanter. Varus angulation of the distal fracture fragment. IMPRESSION: Acute, slightly comminuted displaced and angulated right intertrochanteric fracture. Electronically Signed   By: Donavan Foil M.D.   On: 11/03/2017 01:03    EKG: Orders placed or performed during the hospital encounter of 11/03/17  . ED EKG  . ED EKG  . ED EKG  . ED EKG  . EKG 12-Lead  . EKG 12-Lead    IMPRESSION AND PLAN:  1. Right intertrochanteric fracture.  Ortho consulted for surgical repair.  Based on her functional capacity and normal EKG, patient is at low surgical risk for this orthopedic procedure.  2.  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  Patient admits to drinking at least 5 beers every day.  Her alcohol level is currently elevated at 234.  I discussed alcohol cessation with the patient and her daughter.  We will start IV fluids for now and continue to monitor sodium level closely. 3.  Recurrent lung cancer.  She is to  follow-up with oncology, pulmonary and thoracic surgery for possible surgical resection in the near future. 4.  Alcohol abuse, will monitor for withdrawal symptoms.  We will start CIWA protocol. 5.  Tobacco abuse.  Smoking cessation was discussed with patient in detail.  6.  COPD stable, continue home maintenance medications.  All the records are reviewed and case discussed with ED provider. Management plans discussed with the patient, family and they are in agreement.  CODE STATUS:    Code Status Orders  (From admission, onward)        Start     Ordered   11/03/17 0338  Full code  Continuous     11/03/17 0337    Code Status History    Date Active Date Inactive Code Status Order ID Comments User Context   12/15/2015 14:47 12/17/2015 13:58 Full Code [id number]  Henreitta Leber, MD Inpatient    Advance Directive Documentation     Most Recent Value  Type of Advance Directive  Healthcare Power of Attorney, Living will  Pre-existing out of facility DNR order (yellow form or pink MOST form)  No data  "MOST" Form in Place?  No data       TOTAL TIME TAKING CARE OF THIS PATIENT: 35 minutes.    Amelia Jo M.D on 11/03/2017 at 3:47 AM  Between 7am to 6pm - Pager - [phone]  After 6pm go to [email or web address] -  password [email or web address] [facility]  [facility]  Office  [phone]  CC: Primary care physician; Kirk Ruths, MD

## 2017-11-03 NOTE — Consult Note (Signed)
ORTHOPAEDIC CONSULTATION  REQUESTING PHYSICIAN: Salary, Avel Peace, MD  Chief Complaint:   R hip pain  History of Present Illness: Lori Rangel is a 57 y.o. female who had a mechanical fall last night and noted 10/10 R hip pain with inability to ambulate afterwards. Pain is in R groin region. Worse with any movement. Improved only with rest. Patient ambulates without assistance at baseline.   She is a smoker and has history of alcohol abuse. Also has hx of lung cancer s/p CyberKnife, but there is concern for recurrence and she is being followed at [facility] for this.   Past Medical History:  Diagnosis Date  . Asthma   . Cancer ([place])   . COPD (chronic obstructive pulmonary disease) ([place])   . Shortness of breath dyspnea   . Weight decrease major recent weight loss   Past Surgical History:  Procedure Laterality Date  . BREAST BIOPSY Right   . COLONOSCOPY    . ELECTROMAGNETIC NAVIGATION BROCHOSCOPY N/A 01/02/2016   Procedure: ELECTROMAGNETIC NAVIGATION BRONCHOSCOPY;  Surgeon: Flora Lipps, MD;  Location: [facility] ORS;  Service: Cardiopulmonary;  Laterality: N/A;   Social History   Socioeconomic History  . Marital status: Legally Separated    Spouse name: None  . Number of children: None  . Years of education: None  . Highest education level: None  Social Needs  . Financial resource strain: None  . Food insecurity - worry: None  . Food insecurity - inability: None  . Transportation needs - medical: None  . Transportation needs - non-medical: None  Occupational History  . None  Tobacco Use  . Smoking status: Current Every Day Smoker    Packs/day: 0.75    Years: 40.00    Pack years: 30.00    Types: Cigarettes  . Smokeless tobacco: Never Used  Substance and Sexual Activity  . Alcohol use: Yes    Alcohol/week: 3.6 oz    Types: 6 Cans of beer per week    Comment: 6 beers daily  . Drug use: No  . Sexual activity: No   Other Topics Concern  . None  Social History Narrative  . None   Family History  Problem Relation Age of Onset  . Cirrhosis Father    Allergies  Allergen Reactions  . Levaquin [Levofloxacin] Nausea Only    Cramps & Body aches   Prior to Admission medications   Medication Sig Start Date End Date Taking? Authorizing Provider  aspirin EC 81 MG tablet Take 81 mg by mouth daily.   Yes [provider]  fluticasone (FLOVENT HFA) 110 MCG/ACT inhaler Inhale 2 puffs into the lungs 2 (two) times daily. 01/31/17 01/31/18 Yes [provider]  mirtazapine (REMERON SOL-TAB) 30 MG disintegrating tablet Take 30 mg by mouth. Pt takes 1/4th tablet by mouth daily 04/18/17  Yes [provider]  VENTOLIN HFA 108 (90 Base) MCG/ACT inhaler INHALE 2 PUFFS INTO LUNGS EVERY 6 HOURS AS NEEDED FOR WHEEZING OR SHORTNESS OF BREATH 10/14/16  Yes Laverle Hobby, MD  budesonide-formoterol (SYMBICORT) 160-4.5 MCG/ACT inhaler Inhale 2 puffs into the lungs 2 (two) times daily. 12/21/15 01/02/16  Laverle Hobby, MD  guaiFENesin (ROBITUSSIN) 100 MG/5ML SOLN Take 5 mLs (100 mg total) by mouth every 4 (four) hours as needed for cough or to loosen phlegm. Patient not taking: Reported on 11/03/2017 12/17/15   Henreitta Leber, MD  megestrol (MEGACE) 400 MG/10ML suspension Take 10 mLs (400 mg total) by mouth 2 (two) times daily. Patient not taking: Reported  on 11/03/2017 01/18/16   Laverle Hobby, MD  varenicline (CHANTIX CONTINUING MONTH PAK) 1 MG tablet Take 1 tablet (1 mg total) by mouth 2 (two) times daily. Patient not taking: Reported on 11/03/2017 12/29/15   Minna Merritts, MD   Recent Labs    11/03/17 0102 11/03/17 0406  WBC 8.1 7.5  HGB 13.1 11.7*  HCT 37.1 34.0*  PLT 347 311  K 3.8 3.8  CL 89* 94*  CO2 20* 19*  BUN <5* <5*  CREATININE 0.40* 0.30*  GLUCOSE 107* 102*  CALCIUM 7.9* 7.3*  INR 0.96  --    Dg Chest Port 1 View  Result Date: 11/03/2017 CLINICAL DATA:  Fall with  hip fracture EXAM: PORTABLE CHEST 1 VIEW COMPARISON:  12/15/2015 FINDINGS: Bilateral axillary vascular calcification. Increased size of a left upper lobe lung nodule, measuring approximately 2 cm in size. Metallic densities are noted in the region. Heart size within normal limits. Aortic atherosclerosis. No pneumothorax. IMPRESSION: 1. Negative for acute infiltrate or effusion 2. Apparent interval increase in size of a left upper lobe lung nodule. Follow-up CT chest recommended. Electronically Signed   By: Donavan Foil M.D.   On: 11/03/2017 01:07   Dg Hip Unilat With Pelvis 2-3 Views Right  Result Date: 11/03/2017 CLINICAL DATA:  Fall with hip pain EXAM: DG HIP (WITH OR WITHOUT PELVIS) 2-3V RIGHT COMPARISON:  None FINDINGS: Pubic symphysis and rami are intact. The left femoral head projects in joint. The SI joints show mild degenerative change but are non widened. Acute displaced intertrochanteric fracture of the proximal right femur with cephalad migration of the trochanter. Varus angulation of the distal fracture fragment. IMPRESSION: Acute, slightly comminuted displaced and angulated right intertrochanteric fracture. Electronically Signed   By: Donavan Foil M.D.   On: 11/03/2017 01:03     Positive ROS: All other systems have been reviewed and were otherwise negative with the exception of those mentioned in the HPI and as above.  Physical Exam: BP 116/70 (BP Location: Left Arm)   Pulse 76   Temp 97.8 F (36.6 C) (Oral)   Resp 18   Ht 5\' 2"  (1.575 m)   Wt 45.5 kg (100 lb 3.2 oz)   SpO2 99%   BMI 18.33 kg/m  General:  Alert, no acute distress Psychiatric:  Patient is competent for consent with normal mood and affect   Cardiovascular:  No pedal edema, regular rate and rhythm Respiratory:  No wheezing, non-labored breathing GI:  Abdomen is soft and non-tender Skin:  No lesions in the area of chief complaint, no erythema Neurologic:  Sensation intact distally, CN grossly intact Lymphatic:   No axillary or cervical lymphadenopathy  Orthopedic Exam:  RLE: 5/5 DF/PF/EHL SILT s/s/t/sp/dp distr Foot wwp +axial load, logroll   X-rays:  As above: R intertrochanteric hip fracture  Assessment/Plan: 57 yo F w/R intertrochanteric hip fracture 1. We discussed both surgical and non-surgical options. After discussion of risks, benefits, and alternatives to surgery, the patient elected to proceed. Surgery will be for R hip cephalomedullary nailing. We discussed risks of surgery including infection, bleeding, muscle/nerve damage as well as the morbidity associated with hip fracture and increased risk of complication given smoking and alcohol history. 2. NPO until OR. Plan for ~3-4pm.     Leim Fabry   11/03/2017 7:51 AM

## 2017-11-04 ENCOUNTER — Encounter: Payer: Self-pay | Admitting: Orthopedic Surgery

## 2017-11-04 LAB — BASIC METABOLIC PANEL
ANION GAP: 7 (ref 5–15)
BUN: <5 mg/dL — ABNORMAL LOW (ref 6–20)
CALCIUM: 7.1 mg/dL — AB (ref 8.9–10.3)
CO2: 22 mmol/L (ref 22–32)
Chloride: 100 mmol/L — ABNORMAL LOW (ref 101–111)
Creatinine, Ser: 0.44 mg/dL (ref 0.44–1.00)
Glucose, Bld: 117 mg/dL — ABNORMAL HIGH (ref 65–99)
POTASSIUM: 3.7 mmol/L (ref 3.5–5.1)
SODIUM: 129 mmol/L — AB (ref 135–145)

## 2017-11-04 LAB — CBC
HEMATOCRIT: 26.7 % — AB (ref 35.0–47.0)
HEMOGLOBIN: 9 g/dL — AB (ref 12.0–16.0)
MCH: 36.7 pg — ABNORMAL HIGH (ref 26.0–34.0)
MCHC: 33.6 g/dL (ref 32.0–36.0)
MCV: 109.2 fL — ABNORMAL HIGH (ref 80.0–100.0)
Platelets: 249 10*3/uL (ref 150–440)
RBC: 2.45 MIL/uL — ABNORMAL LOW (ref 3.80–5.20)
RDW: 13.8 % (ref 11.5–14.5)
WBC: 6.4 10*3/uL (ref 3.6–11.0)

## 2017-11-04 LAB — GLUCOSE, CAPILLARY: GLUCOSE-CAPILLARY: 99 mg/dL (ref 65–99)

## 2017-11-04 LAB — HIV ANTIBODY (ROUTINE TESTING W REFLEX): HIV Screen 4th Generation wRfx: NONREACTIVE

## 2017-11-04 MED ORDER — SODIUM CHLORIDE 1 G PO TABS
2.0000 g | ORAL_TABLET | Freq: Three times a day (TID) | ORAL | Status: AC
  Administered 2017-11-04 (×2): 2 g via ORAL
  Filled 2017-11-04 (×4): qty 2

## 2017-11-04 NOTE — Progress Notes (Signed)
Occupational Therapy Treatment Patient Details Name: Lori Rangel MRN: [id number] DOB: 11/21/1960 Today's Date: 11/04/2017    History of present illness Pt. is a 57 y.o. female who was admitted to [facility] for IM Nailing of Right Intertrochanteric Hip Fx. Pt. PMHx includes: Asthma, CA, COPD, and SOB.   OT comments  Pt. Presents with 5/10 pain, weakness, and limited mobility which limit her ability to complete ADL, and IADL tasks. Pt. Resides at home alone. Pt. Has a sister who lives close by, and 2 daughters who are very supportive. Pt. was independent with ADLs, and has assist with IADLs as needed from family. Pt. Does not drive, and hasn't worked in 2 years secondary to cancer. Pt. Education was provided about A/E use for LE ADLs. Pt. could benefit from OT services for ADL training, A/E training, and pt. education about home modification, and DME. Pt. Plans to return home upon discharge. Pt. Could benefit from follow-up OT services to assist with ADLs, and IADLs.     Follow Up Recommendations  Home health OT    Equipment Recommendations       Recommendations for Other Services Rehab consult    Precautions / Restrictions Precautions Precautions: Fall;Other (comment) Precaution Comments: Monitor O2 Restrictions Weight Bearing Restrictions: Yes RLE Weight Bearing: Weight bearing as tolerated       Mobility Bed Mobility Overal bed mobility: Needs Assistance Bed Mobility: Supine to Sit     Supine to sit: Min guard;HOB elevated       Transfers Overall transfer level: Needs assistance Equipment used: Rolling walker (2 wheeled) Transfers: Sit to/from Stand Sit to Stand: Min assist         General transfer comment: Mobility per PT report    Balance Overall balance assessment: Needs assistance Sitting-balance support: No upper extremity supported;Feet supported Sitting balance-Leahy Scale: Good     Standing balance support: Single extremity supported;During  functional activity Standing balance-Leahy Scale: Poor Standing balance comment: Relies on UE support for static and dynamic activities                           ADL either performed or assessed with clinical judgement   ADL   Eating/Feeding: Set up;Independent   Grooming: Set up;Minimal assistance   Upper Body Bathing: Set up;Minimal assistance   Lower Body Bathing: Set up;Maximal assistance   Upper Body Dressing : Set up;Minimal assistance   Lower Body Dressing: Set up;Maximal assistance               Functional mobility during ADLs: Set up General ADL Comments: Pt. education was provided about A/E use for LE ADLs     Vision Baseline Vision/History: No visual deficits;Wears glasses Patient Visual Report: No change from baseline     Perception     Praxis      Cognition Arousal/Alertness: Awake/alert Behavior During Therapy: Anxious Overall Cognitive Status: Within Functional Limits for tasks assessed                                          Exercises   Shoulder Instructions       General Comments      Pertinent Vitals/ Pain       Pain Assessment: 0-10 Pain Score: 5  Faces Pain Scale: Hurts whole lot Pain Location: Right Hip Pain Descriptors / Indicators: Aching;Grimacing;Moaning Pain Intervention(s): Monitored  during session;Repositioned;Premedicated before session  [place] expects to be discharged to:: Private residence Living Arrangements: Alone Available Help at Discharge: Family;Available 24 hours/day(sister) Type of Home: Mobile home Home Access: Stairs to enter Entrance Stairs-Number of Steps: 3 Entrance Stairs-Rails: Left;Right;Can reach both Home Layout: One level     Bathroom Shower/Tub: Corporate investment banker: Standard     Home Equipment: Hand held shower head          Prior Functioning/Environment Level of Independence: Independent        Comments: Pt  independent with all aspects of mobility.  Not using AD at baseline.    Frequency           Progress Toward Goals  OT Goals(current goals can now be found in the care plan section)     Acute Rehab OT Goals Patient Stated Goal: to go home  OT Goal Formulation: With patient Potential to Achieve Goals: Good  Plan      Co-evaluation                 AM-PAC PT "6 Clicks" Daily Activity     Outcome Measure   Help from another person eating meals?: None Help from another person taking care of personal grooming?: A Little Help from another person toileting, which includes using toliet, bedpan, or urinal?: A Little Help from another person bathing (including washing, rinsing, drying)?: A Lot Help from another person to put on and taking off regular upper body clothing?: A Lot Help from another person to put on and taking off regular lower body clothing?: A Lot 6 Click Score: 16    End of Session Equipment Utilized During Treatment: Gait belt  OT Visit Diagnosis: Unsteadiness on feet (R26.81)   Activity Tolerance Patient tolerated treatment well;Patient limited by pain   Patient Left in bed;with call bell/phone within reach;with bed alarm set   Nurse Communication          Time: 1607-3710 OT Time Calculation (min): 15 min  Charges: OT General Charges $OT Visit: 1 Visit OT Evaluation $OT Eval Low Complexity: 1 Low  Harrel Carina, MS, OTR/L    Harrel Carina, MS, OTR/L 11/04/2017, 11:53 AM

## 2017-11-04 NOTE — Progress Notes (Signed)
PT is recommending home health. RN case manager aware of above. Please reconsult if future social work needs arise. CSW signing off.   [employer], LCSW [phone]

## 2017-11-04 NOTE — Discharge Instructions (Signed)
INSTRUCTIONS AFTER Surgery  o Remove items at home which could result in a fall. This includes throw rugs or furniture in walking pathways o ICE to the affected joint every three hours while awake for 30 minutes at a time, for at least the first 3-5 days, and then as needed for pain and swelling.  Continue to use ice for pain and swelling. You may notice swelling that will progress down to the foot and ankle.  This is normal after surgery.  Elevate your leg when you are not up walking on it.   o Continue to use the breathing machine you got in the hospital (incentive spirometer) which will help keep your temperature down.  It is common for your temperature to cycle up and down following surgery, especially at night when you are not up moving around and exerting yourself.  The breathing machine keeps your lungs expanded and your temperature down.   DIET:  As you were doing prior to hospitalization, we recommend a well-balanced diet.  DRESSING / WOUND CARE / SHOWERING  Dressing to remain intact. Keep dry. Change dressing if needed. Staples will be removed at 2 weeks.  ACTIVITY  o Increase activity slowly as tolerated, but follow the weight bearing instructions below.   o No driving for 6 weeks or until further direction given by your physician.  You cannot drive while taking narcotics.  o No lifting or carrying greater than 10 lbs. until further directed by your surgeon. o Avoid periods of inactivity such as sitting longer than an hour when not asleep. This helps prevent blood clots.  o You may return to work once you are authorized by your doctor.     WEIGHT BEARING  Weight-bearing as tolerated to right leg   EXERCISES Gait training and ambulation with strengthening.  CONSTIPATION  Constipation is defined medically as fewer than three stools per week and severe constipation as less than one stool per week.  Even if you have a regular bowel pattern at home, your normal regimen is likely  to be disrupted due to multiple reasons following surgery.  Combination of anesthesia, postoperative narcotics, change in appetite and fluid intake all can affect your bowels.   YOU MUST use at least one of the following options; they are listed in order of increasing strength to get the job done.  They are all available over the counter, and you may need to use some, POSSIBLY even all of these options:    Drink plenty of fluids (prune juice may be helpful) and high fiber foods Colace 100 mg by mouth twice a day  Senokot for constipation as directed and as needed Dulcolax (bisacodyl), take with full glass of water  Miralax (polyethylene glycol) once or twice a day as needed.  If you have tried all these things and are unable to have a bowel movement in the first 3-4 days after surgery call either your surgeon or your primary doctor.    If you experience loose stools or diarrhea, hold the medications until you stool forms back up.  If your symptoms do not get better within 1 week or if they get worse, check with your doctor.  If you experience "the worst abdominal pain ever" or develop nausea or vomiting, please contact the office immediately for further recommendations for treatment.   ITCHING:  If you experience itching with your medications, try taking only a single pain pill, or even half a pain pill at a time.  You can also  use Benadryl over the counter for itching or also to help with sleep.   TED HOSE STOCKINGS:  Use stockings on both legs until for at least 2 weeks or as directed by physician office. They may be removed at night for sleeping.  MEDICATIONS:  See your medication summary on the After Visit Summary that nursing will review with you.  You may have some home medications which will be placed on hold until you complete the course of blood thinner medication.  It is important for you to complete the blood thinner medication as prescribed.  PRECAUTIONS:  If you experience chest  pain or shortness of breath - call 911 immediately for transfer to the hospital emergency department.   If you develop a fever greater that 101 F, purulent drainage from wound, increased redness or drainage from wound, foul odor from the wound/dressing, or calf pain - CONTACT YOUR SURGEON.                                                   FOLLOW-UP APPOINTMENTS:  If you do not already have a post-op appointment, please call the office for an appointment to be seen by your surgeon.  Guidelines for how soon to be seen are listed in your After Visit Summary, but are typically between 1-4 weeks after surgery.  OTHER INSTRUCTIONS:     MAKE SURE YOU:   Understand these instructions.   Get help right away if you are not doing well or get worse.    Thank you for letting us be a part of your medical care team.  It is a privilege we respect greatly.  We hope these instructions will help you stay on track for a fast and full recovery!

## 2017-11-04 NOTE — Progress Notes (Signed)
[facility] [place] NAME: Lori Rangel    MR#:  [id number]  DATE OF BIRTH:  [birth year]  SUBJECTIVE:  CHIEF COMPLAINT:   Chief Complaint  Patient presents with  . Hip Pain  Patient feeling slightly better, wean O2 off as tolerated, salt tabs 3 times daily, repeat BMP in the morning, continue physical therapy  REVIEW OF SYSTEMS:  CONSTITUTIONAL: No fever, fatigue or weakness.  EYES: No blurred or double vision.  EARS, NOSE, AND THROAT: No tinnitus or ear pain.  RESPIRATORY: No cough, shortness of breath, wheezing or hemoptysis.  CARDIOVASCULAR: No chest pain, orthopnea, edema.  GASTROINTESTINAL: No nausea, vomiting, diarrhea or abdominal pain.  GENITOURINARY: No dysuria, hematuria.  ENDOCRINE: No polyuria, nocturia,  HEMATOLOGY: No anemia, easy bruising or bleeding SKIN: No rash or lesion. MUSCULOSKELETAL: No joint pain or arthritis.   NEUROLOGIC: No tingling, numbness, weakness.  PSYCHIATRY: No anxiety or depression.   ROS  DRUG ALLERGIES:   Allergies  Allergen Reactions  . Levaquin [Levofloxacin] Nausea Only    Cramps & Body aches    VITALS:  Blood pressure (!) 115/55, pulse 83, temperature 98.4 F (36.9 C), temperature source Oral, resp. rate 20, height 5\' 2"  (1.575 m), weight 53.1 kg (117 lb), SpO2 100 %.  PHYSICAL EXAMINATION:  GENERAL:  56 y.o.-year-old patient lying in the bed with no acute distress.  EYES: Pupils equal, round, reactive to light and accommodation. No scleral icterus. Extraocular muscles intact.  HEENT: Head atraumatic, normocephalic. Oropharynx and nasopharynx clear.  NECK:  Supple, no jugular venous distention. No thyroid enlargement, no tenderness.  LUNGS: Normal breath sounds bilaterally, no wheezing, rales,rhonchi or crepitation. No use of accessory muscles of respiration.  CARDIOVASCULAR: S1, S2 normal. No murmurs, rubs, or gallops.  ABDOMEN: Soft, nontender, nondistended. Bowel sounds present. No  organomegaly or mass.  EXTREMITIES: No pedal edema, cyanosis, or clubbing.  NEUROLOGIC: Cranial nerves II through XII are intact. Muscle strength 5/5 in all extremities. Sensation intact. Gait not checked.  PSYCHIATRIC: The patient is alert and oriented x 3.  SKIN: No obvious rash, lesion, or ulcer.   Physical Exam LABORATORY PANEL:   CBC Recent Labs  Lab 11/04/17 0336  WBC 6.4  HGB 9.0*  HCT 26.7*  PLT 249   ------------------------------------------------------------------------------------------------------------------  Chemistries  Recent Labs  Lab 11/03/17 0102  11/04/17 0336  NA 123*   < > 129*  K 3.8   < > 3.7  CL 89*   < > 100*  CO2 20*   < > 22  GLUCOSE 107*   < > 117*  BUN <5*   < > <5*  CREATININE 0.40*   < > 0.44  CALCIUM 7.9*   < > 7.1*  AST 55*  --   --   ALT 29  --   --   ALKPHOS 220*  --   --   BILITOT 0.7  --   --    < > = values in this interval not displayed.   ------------------------------------------------------------------------------------------------------------------  Cardiac Enzymes No results for input(s): TROPONINI in the last 168 hours. ------------------------------------------------------------------------------------------------------------------  RADIOLOGY:  Dg Chest Port 1 View  Result Date: 11/03/2017 CLINICAL DATA:  Fall with hip fracture EXAM: PORTABLE CHEST 1 VIEW COMPARISON:  12/15/2015 FINDINGS: Bilateral axillary vascular calcification. Increased size of a left upper lobe lung nodule, measuring approximately 2 cm in size. Metallic densities are noted in the region. Heart size within normal limits. Aortic atherosclerosis. No pneumothorax. IMPRESSION: 1. Negative  for acute infiltrate or effusion 2. Apparent interval increase in size of a left upper lobe lung nodule. Follow-up CT chest recommended. Electronically Signed   By: Donavan Foil M.D.   On: 11/03/2017 01:07   Dg Hip Operative Unilat W Or W/o Pelvis Right  Result Date:  11/03/2017 CLINICAL DATA:  57 year old female with right intertrochanteric fracture. Subsequent encounter. EXAM: OPERATIVE right HIP (WITH PELVIS IF PERFORMED) 5 VIEWS TECHNIQUE: Fluoroscopic spot image(s) were submitted for interpretation post-operatively. COMPARISON:  11/03/2017 plain film exam. FINDINGS: Right intertrochanteric fracture reduced with long femoral rod with distal fixation screws and proximal sliding type screw. IMPRESSION: Open reduction and internal fixation right intertrochanteric fracture. Electronically Signed   By: Genia Del M.D.   On: 11/03/2017 19:30   Dg Hip Unilat With Pelvis 2-3 Views Right  Result Date: 11/03/2017 CLINICAL DATA:  Fall with hip pain EXAM: DG HIP (WITH OR WITHOUT PELVIS) 2-3V RIGHT COMPARISON:  None FINDINGS: Pubic symphysis and rami are intact. The left femoral head projects in joint. The SI joints show mild degenerative change but are non widened. Acute displaced intertrochanteric fracture of the proximal right femur with cephalad migration of the trochanter. Varus angulation of the distal fracture fragment. IMPRESSION: Acute, slightly comminuted displaced and angulated right intertrochanteric fracture. Electronically Signed   By: Donavan Foil M.D.   On: 11/03/2017 01:03    ASSESSMENT AND PLAN:  1. Acute Right intertrochanteric fracture. 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 Add salt tabs, regular diet, gentle IV fluids for rehydration, BMP in the morning   3.Recurrent lung cancer Stable We will follow-up status post discharge from oncology for continued medical management  4.Alcohol abuse, acute on chronic Continue CIWAprotocol  5.Tobacco abuse, acute on chronic Cessation counseling and nicotine patch daily  6.COPD without exacerbation  Stable BTs prn   All the records are reviewed and case discussed with Care  Management/Social Workerr. Management plans discussed with the patient, family and they are in agreement.  CODE STATUS: full  TOTAL TIME TAKING CARE OF THIS PATIENT: 35 minutes.     POSSIBLE D/C IN 1-2 DAYS, DEPENDING ON CLINICAL CONDITION.   Avel Peace Salary M.D on 11/04/2017   Between 7am to 6pm - Pager - [phone]  After 6pm go to [email or web address] - password [email or web address] [facility]  Office  [phone]  CC: Primary care physician; Kirk Ruths, MD  Note: This dictation was prepared with Dragon dictation along with smaller phrase technology. Any transcriptional errors that result from this process are unintentional.

## 2017-11-04 NOTE — Evaluation (Signed)
Physical Therapy Evaluation Patient Details Name: Lori Rangel MRN: [id number] DOB: 16-Apr-1961 Today's Date: 11/04/2017   History of Present Illness  Pt. is a 57 y.o. female who was admitted to [facility] for IM Nailing of Right Intertrochanteric Hip Fx. Pt. PMHx includes: Asthma, CA, COPD, and SOB.    Clinical Impression  Patient is s/p above surgery resulting in functional limitations due to the deficits listed below (see PT Problem List). Ms. Winterrowd reports pain in R hip but was agrreable to therapy. She was independent as baseline with all aspects of mobility.  Pt currently requires min assist for sit>stand and min guard to ambulate 40 ft in room with RW. SpO2 down as low as 89% on RW and drops to 87% on RA when sitting just after finishing ambulation.  RN notified.  Pt reports she will have 24/7 assist/supervision from her sister who will be able to provide physical assist if needed.  Patient will benefit from skilled PT to increase their independence and safety with mobility to allow discharge to the venue listed below.      Follow Up Recommendations Home health PT;Supervision for mobility/OOB    Equipment Recommendations  Rolling walker with 5" wheels;3in1 (PT)    Recommendations for Other Services       Precautions / Restrictions Precautions Precautions: Fall;Other (comment) Precaution Comments: Monitor O2 Restrictions Weight Bearing Restrictions: Yes RLE Weight Bearing: Weight bearing as tolerated      Mobility  Bed Mobility Overal bed mobility: Needs Assistance Bed Mobility: Supine to Sit     Supine to sit: Min guard;HOB elevated     General bed mobility comments: Cues for sequencing and increased time and effort.  Pt uses bed rail.   Transfers Overall transfer level: Needs assistance Equipment used: Rolling walker (2 wheeled) Transfers: Sit to/from Stand Sit to Stand: Min assist         General transfer comment: Light min assist to boost to standing.  Cues for  proper hand placement and technique.  Cues to bring RW back with her when backing up to chair.   Ambulation/Gait Ambulation/Gait assistance: Min guard Ambulation Distance (Feet): 40 Feet Assistive device: Rolling walker (2 wheeled) Gait Pattern/deviations: Step-to pattern;Decreased stride length;Decreased stance time - right;Decreased step length - left;Decreased weight shift to right;Antalgic;Trunk flexed Gait velocity: decreased Gait velocity interpretation: Below normal speed for age/gender General Gait Details: Cues provided for sequencing using RW.  Cues for upright posture and forward gaze which pt performs temporarily.  SpO2 down as low as 89% on RW and drops to 87% on RA when sitting just after finishing ambulation.  RN notified.   Stairs            Wheelchair Mobility    Modified Rankin (Stroke Patients Only)       Balance Overall balance assessment: Needs assistance Sitting-balance support: No upper extremity supported;Feet supported Sitting balance-Leahy Scale: Good     Standing balance support: Single extremity supported;During functional activity Standing balance-Leahy Scale: Poor Standing balance comment: Relies on UE support for static and dynamic activities                             Pertinent Vitals/Pain Pain Assessment: Faces Faces Pain Scale: Hurts whole lot Pain Location: R hip Pain Descriptors / Indicators: Aching;Grimacing;Guarding;Moaning Pain Intervention(s): Limited activity within patient's tolerance;Monitored during session;Repositioned;Premedicated before session;Utilized relaxation techniques    Home Living Family/patient expects to be discharged to:: Private residence Living  Arrangements: Alone Available Help at Discharge: Family;Available 24 hours/day(sister) Type of Home: Mobile home Home Access: Stairs to enter Entrance Stairs-Rails: Left;Right;Can reach both Entrance Stairs-Number of Steps: 3 Home Layout: One  level Home Equipment: Hand held shower head      Prior Function Level of Independence: Independent         Comments: Pt independent with all aspects of mobility.  Not using AD at baseline.      Hand Dominance        Extremity/Trunk Assessment   Upper Extremity Assessment Upper Extremity Assessment: Defer to OT evaluation    Lower Extremity Assessment Lower Extremity Assessment: RLE deficits/detail RLE Deficits / Details: Unable to formally assess but strength grossly 3/5 RLE: Unable to fully assess due to pain    Cervical / Trunk Assessment Cervical / Trunk Assessment: Kyphotic  Communication   Communication: No difficulties  Cognition Arousal/Alertness: Awake/alert Behavior During Therapy: Anxious;WFL for tasks assessed/performed Overall Cognitive Status: Within Functional Limits for tasks assessed                                        General Comments      Exercises Total Joint Exercises Ankle Circles/Pumps: AROM;Both;10 reps;Supine Quad Sets: Strengthening;Both;10 reps;Supine Heel Slides: AAROM;Right;10 reps;Supine Hip ABduction/ADduction: AAROM;Right;10 reps;Supine   Assessment/Plan    PT Assessment Patient needs continued PT services  PT Problem List Decreased strength;Decreased range of motion;Decreased activity tolerance;Decreased balance;Decreased knowledge of use of DME;Decreased safety awareness;Cardiopulmonary status limiting activity;Pain       PT Treatment Interventions DME instruction;Gait training;Stair training;Functional mobility training;Therapeutic activities;Therapeutic exercise;Balance training;Neuromuscular re-education;Wheelchair mobility training;Patient/family education;Modalities    PT Goals (Current goals can be found in the Care Plan section)  Acute Rehab PT Goals Patient Stated Goal: to go home at d/c PT Goal Formulation: With patient Time For Goal Achievement: 11/18/17 Potential to Achieve Goals: Good     Frequency BID   Barriers to discharge        Co-evaluation               AM-PAC PT "6 Clicks" Daily Activity  Outcome Measure Difficulty turning over in bed (including adjusting bedclothes, sheets and blankets)?: Unable Difficulty moving from lying on back to sitting on the side of the bed? : Unable Difficulty sitting down on and standing up from a chair with arms (e.g., wheelchair, bedside commode, etc,.)?: Unable Help needed moving to and from a bed to chair (including a wheelchair)?: A Little Help needed walking in hospital room?: A Little Help needed climbing 3-5 steps with a railing? : A Lot 6 Click Score: 11    End of Session Equipment Utilized During Treatment: Gait belt;Oxygen Activity Tolerance: Patient tolerated treatment well;Patient limited by pain Patient left: in chair;with call bell/phone within reach;with chair alarm set;with SCD's reapplied Nurse Communication: Mobility status;Other (comment)(SpO2) PT Visit Diagnosis: Pain;Unsteadiness on feet (R26.81);Other abnormalities of gait and mobility (R26.89);Muscle weakness (generalized) (M62.81);Difficulty in walking, not elsewhere classified (R26.2) Pain - Right/Left: Right Pain - part of body: Hip    Time: 1010-1034 PT Time Calculation (min) (ACUTE ONLY): 24 min   Charges:   PT Evaluation $PT Eval Low Complexity: 1 Low PT Treatments $Gait Training: 8-22 mins   PT G Codes:        Collie Siad PT, DPT 11/04/2017, 10:45 AM

## 2017-11-04 NOTE — Progress Notes (Signed)
Physical Therapy Treatment Patient Details Name: Lori Rangel MRN: [id number] DOB: 01-28-1961 Today's Date: 11/04/2017    History of Present Illness Pt. is a 57 y.o. female who was admitted to [facility] for IM Nailing of Right Intertrochanteric Hip Fx. Pt. PMHx includes: Asthma, CA, COPD, and SOB.    PT Comments    Lori Rangel made good progress with mobility, ambulating 80 ft with RW and cues for upright posture.  She improved to only requiring min guard assist for sit>stand transfer this afternoon.  She does require min assist for sit>supine.  Follow up recommendations remain appropriate.  Pt will need to complete stair training next date.  Pt will benefit from continued skilled PT services to increase functional independence and safety.    Follow Up Recommendations  Home health PT;Supervision for mobility/OOB     Equipment Recommendations  Rolling walker with 5" wheels;3in1 (PT)    Recommendations for Other Services       Precautions / Restrictions Precautions Precautions: Fall;Other (comment) Precaution Comments: Monitor O2 Restrictions Weight Bearing Restrictions: Yes RLE Weight Bearing: Weight bearing as tolerated    Mobility  Bed Mobility Overal bed mobility: Needs Assistance Bed Mobility: Supine to Sit;Sit to Supine     Supine to sit: Min guard;HOB elevated Sit to supine: Min assist   General bed mobility comments: Cues for sequencing and increased time and effort.  Pt uses bed rail for supine>sit.  To return to supine pt unable to perform leg hook technique and requires assist to bring RLE into bed.   Transfers Overall transfer level: Needs assistance Equipment used: Rolling walker (2 wheeled) Transfers: Sit to/from Stand Sit to Stand: Min guard         General transfer comment: Cues for proper hand placement.  Pt slow to stand.  Pt requires cues to reach back for bed and bring RW back with her when preparing to sit.   Ambulation/Gait Ambulation/Gait  assistance: Min guard Ambulation Distance (Feet): 80 Feet Assistive device: Rolling walker (2 wheeled) Gait Pattern/deviations: Step-to pattern;Decreased stride length;Decreased stance time - right;Decreased step length - left;Decreased weight shift to right;Antalgic;Trunk flexed Gait velocity: decreased Gait velocity interpretation: Below normal speed for age/gender General Gait Details: Cues for upright posture and forward gaze which pt does not maintain throughout.  SpO2 89-90% on RA, up to 93-94% on 2L O2.    Stairs            Wheelchair Mobility    Modified Rankin (Stroke Patients Only)       Balance Overall balance assessment: Needs assistance Sitting-balance support: No upper extremity supported;Feet supported Sitting balance-Leahy Scale: Good     Standing balance support: Single extremity supported;During functional activity Standing balance-Leahy Scale: Poor Standing balance comment: Relies on UE support for static and dynamic activities                            Cognition Arousal/Alertness: Awake/alert Behavior During Therapy: Anxious;WFL for tasks assessed/performed Overall Cognitive Status: Within Functional Limits for tasks assessed                                        Exercises Total Joint Exercises Ankle Circles/Pumps: AROM;Both;10 reps;Supine Quad Sets: Strengthening;10 reps;Supine;Right Hip ABduction/ADduction: AAROM;Right;10 reps;Supine Straight Leg Raises: AAROM;Strengthening;Right;10 reps;Supine    General Comments        Pertinent Vitals/Pain Pain  Assessment: Faces Pain Score: 5  Faces Pain Scale: Hurts even more Pain Location: R hip Pain Descriptors / Indicators: Aching;Grimacing;Guarding;Moaning Pain Intervention(s): Monitored during session;Limited activity within patient's tolerance;Repositioned    Home Living                      Prior Function            PT Goals (current goals can now  be found in the care plan section) Acute Rehab PT Goals Patient Stated Goal: to go home at d/c PT Goal Formulation: With patient Time For Goal Achievement: 11/18/17 Potential to Achieve Goals: Good Progress towards PT goals: Progressing toward goals    Frequency    BID      PT Plan Current plan remains appropriate    Co-evaluation              AM-PAC PT "6 Clicks" Daily Activity  Outcome Measure  Difficulty turning over in bed (including adjusting bedclothes, sheets and blankets)?: A Lot Difficulty moving from lying on back to sitting on the side of the bed? : Unable Difficulty sitting down on and standing up from a chair with arms (e.g., wheelchair, bedside commode, etc,.)?: A Lot Help needed moving to and from a bed to chair (including a wheelchair)?: A Little Help needed walking in hospital room?: A Little Help needed climbing 3-5 steps with a railing? : A Lot 6 Click Score: 13    End of Session Equipment Utilized During Treatment: Gait belt;Oxygen Activity Tolerance: Patient tolerated treatment well;Patient limited by pain Patient left: with call bell/phone within reach;with SCD's reapplied;in bed;with bed alarm set Nurse Communication: Mobility status;Other (comment)(SpO2) PT Visit Diagnosis: Pain;Unsteadiness on feet (R26.81);Other abnormalities of gait and mobility (R26.89);Muscle weakness (generalized) (M62.81);Difficulty in walking, not elsewhere classified (R26.2) Pain - Right/Left: Right Pain - part of body: Hip     Time: 4103-0131 PT Time Calculation (min) (ACUTE ONLY): 20 min  Charges:  $Gait Training: 8-22 mins                    G Codes:       Collie Siad PT, DPT 11/04/2017, 2:50 PM

## 2017-11-04 NOTE — Progress Notes (Signed)
  Subjective: 1 Day Post-Op Procedure(s) (LRB): INTRAMEDULLARY (IM) NAIL INTERTROCHANTRIC (Right) Patient reports pain as moderate.   Patient seen in rounds with Dr. Posey Pronto. Patient is well, and has had no acute complaints or problems Plan is to go Rehab versus possible home after hospital stay. Negative for chest pain and shortness of breath Fever: no Gastrointestinal: Negative for nausea and vomiting  Objective: Vital signs in last 24 hours: Temp:  [97.1 F (36.2 C)-98.8 F (37.1 C)] 97.9 F (36.6 C) (03/05 0419) Pulse Rate:  [65-117] 79 (03/05 0419) Resp:  [12-21] 19 (03/05 0419) BP: (98-163)/(47-78) 105/55 (03/05 0419) SpO2:  [95 %-100 %] 100 % (03/05 0419) Weight:  [53.1 kg (117 lb)] 53.1 kg (117 lb) (03/05 0422)  Intake/Output from previous day:  Intake/Output Summary (Last 24 hours) at 11/04/2017 0701 Last data filed at 11/04/2017 0630 Gross per 24 hour  Intake 796.25 ml  Output 2010 ml  Net -1213.75 ml    Intake/Output this shift: No intake/output data recorded.  Labs: Recent Labs    11/03/17 0102 11/03/17 0406 11/04/17 0336  HGB 13.1 11.7* 9.0*   Recent Labs    11/03/17 0406 11/04/17 0336  WBC 7.5 6.4  RBC 3.16* 2.45*  HCT 34.0* 26.7*  PLT 311 249   Recent Labs    11/03/17 0406 11/04/17 0336  NA 125* 129*  K 3.8 3.7  CL 94* 100*  CO2 19* 22  BUN <5* <5*  CREATININE 0.30* 0.44  GLUCOSE 102* 117*  CALCIUM 7.3* 7.1*   Recent Labs    11/03/17 0102  INR 0.96     EXAM General - Patient is Alert and Oriented Extremity - Neurovascular intact Sensation intact distally Dorsiflexion/Plantar flexion intact Compartment soft Dressing/Incision - clean, dry, no drainage Motor Function - intact, moving foot and toes well on exam.   Past Medical History:  Diagnosis Date  . Asthma   . Cancer ([place])   . COPD (chronic obstructive pulmonary disease) ([place])   . Shortness of breath dyspnea   . Weight decrease major recent weight loss     Assessment/Plan: 1 Day Post-Op Procedure(s) (LRB): INTRAMEDULLARY (IM) NAIL INTERTROCHANTRIC (Right) Active Problems:   Closed right hip fracture (HCC)  Estimated body mass index is 21.4 kg/m as calculated from the following:   Height as of this encounter: 5\' 2"  (1.575 m).   Weight as of this encounter: 53.1 kg (117 lb). Advance diet Up with therapy D/C IV fluids  Discharge planning  DVT Prophylaxis - Lovenox, Foot Pumps and TED hose Weight-Bearing as tolerated to right leg  Reche Dixon, PA-C Orthopaedic Surgery 11/04/2017, 7:01 AM

## 2017-11-04 NOTE — Progress Notes (Signed)
Occupational Therapy Treatment Patient Details Name: Lori Rangel MRN: [id number] DOB: [birth year] Today's Date: 11/04/2017    History of present illness Pt. is a 57 y.o. female who was admitted to [facility] for IM Nailing of Right Intertrochanteric Hip Fx. Pt. PMHx includes: Asthma, CA, COPD, and SOB.   OT comments  Pt seen for LB dressing skills with AD while sitting at EOB with a lot of encouragement.  She declined getting up and going to sit in recliner after session.  She was provided further education about AD and rec for either [facility] or toilet riser for her sister's toilet since she will be living with her sister at her house.  Rec OT HH help with home set up to prevent falls and further increase independence in ADLs with or without AD.  Pain was 0/5 at rest but increased to 5/5 with active movement.  Pain back to 0/5 after lying back in bed with assist for positioning.   Follow Up Recommendations  Home health OT    Equipment Recommendations  Tub/shower seat;Toilet riser    Recommendations for Other Services      Precautions / Restrictions Precautions Precautions: Fall;Other (comment) Precaution Comments: Monitor O2 Restrictions Weight Bearing Restrictions: Yes RLE Weight Bearing: Weight bearing as tolerated       Mobility Bed Mobility Overal bed mobility: Needs Assistance Bed Mobility: Supine to Sit;Sit to Supine     Supine to sit: Min guard;HOB elevated Sit to supine: Min assist   General bed mobility comments: Cues for sequencing and increased time and effort.  Pt uses bed rail for supine>sit.  To return to supine pt unable to perform leg hook technique and requires assist to bring RLE into bed.   Transfers Overall transfer level: Needs assistance Equipment used: Rolling walker (2 wheeled) Transfers: Sit to/from Stand Sit to Stand: Min guard         General transfer comment: Cues for proper hand placement.  Pt slow to stand.  Pt requires cues to reach back for bed  and bring RW back with her when preparing to sit.     Balance Overall balance assessment: Needs assistance Sitting-balance support: No upper extremity supported;Feet supported Sitting balance-Leahy Scale: Good     Standing balance support: Single extremity supported;During functional activity Standing balance-Leahy Scale: Poor Standing balance comment: Relies on UE support for static and dynamic activities                           ADL either performed or assessed with clinical judgement   ADL Overall ADL's : Needs assistance/impaired Eating/Feeding: Set up;Independent   Grooming: Set up;Minimal assistance                 Lower Body Dressing Details (indicate cue type and reason): Pt initially stating she was not interested in working on anything due to pain in R hip but then agreeable to sitting EOB only for LB dressing with min assist to use sock aid and reacher but stated she did not plan to use a sock aid at home but uses her reacher all the time.                     Vision       Perception     Praxis      Cognition Arousal/Alertness: Awake/alert Behavior During Therapy: Anxious;WFL for tasks assessed/performed Overall Cognitive Status: Within Functional Limits for tasks assessed  Exercises Exercises: Total Joint Total Joint Exercises Ankle Circles/Pumps: AROM;Both;10 reps;Supine Quad Sets: Strengthening;10 reps;Supine;Right Hip ABduction/ADduction: AAROM;Right;10 reps;Supine Straight Leg Raises: AAROM;Strengthening;Right;10 reps;Supine   Shoulder Instructions       General Comments      Pertinent Vitals/ Pain       Pain Assessment: 0-10 Pain Score: 5  Faces Pain Scale: Hurts even more Pain Location: R hip Pain Descriptors / Indicators: Aching;Grimacing;Guarding;Moaning Pain Intervention(s): Limited activity within patient's tolerance;Monitored during  session;Repositioned  Home Living                                          Prior Functioning/Environment              Frequency           Progress Toward Goals  OT Goals(current goals can now be found in the care plan section)  Progress towards OT goals: Progressing toward goals  Acute Rehab OT Goals Patient Stated Goal: to go home tomorrow morning as soon as possible OT Goal Formulation: With patient Potential to Achieve Goals: Good ADL Goals Pt Will Perform Lower Body Dressing: with modified independence Pt Will Transfer to Toilet: with modified independence  Plan Discharge plan remains appropriate    Co-evaluation                 AM-PAC PT "6 Clicks" Daily Activity     Outcome Measure   Help from another person eating meals?: None Help from another person taking care of personal grooming?: A Little Help from another person toileting, which includes using toliet, bedpan, or urinal?: A Little Help from another person bathing (including washing, rinsing, drying)?: A Lot Help from another person to put on and taking off regular upper body clothing?: A Lot Help from another person to put on and taking off regular lower body clothing?: A Lot 6 Click Score: 16    End of Session Equipment Utilized During Treatment: Gait belt  OT Visit Diagnosis: Unsteadiness on feet (R26.81);Pain   Activity Tolerance Patient tolerated treatment well;Patient limited by pain   Patient Left in bed;with call bell/phone within reach;with bed alarm set   Nurse Communication          Time: 3762-8315 OT Time Calculation (min): 30 min  Charges: OT General Charges $OT Visit: 1 Visit OT Treatments $Self Care/Home Management : 23-37 mins  Chrys Racer, OTR/L ascom [phone] 11/04/17, 3:59 PM

## 2017-11-04 NOTE — Care Management Note (Signed)
Case Management Note  Patient Details  Name: Lori Rangel MRN: [id number] Date of Birth: 06/11/1961  Subjective/Objective:  Met with patient and her sister at bedside. Patient angry. She lets her sister do most of the talking. Patient will be staying with her sister, Lori Rangel. They live on the same property. Her sister states she has a walker, bsc and hospital bed for patient. Explained the charity program through [facility]. Patient agreeable to PT but not OT.She was reluctant to PT services but RNCM encouraged her on the importance of therapy in her overall recovery. She agreed to proceed. Verified address, social security and income. Referral to Jason with [facility] for HHPT.  Application given for medication management clinic. PCP is Marshal Anderson.                   Action/Plan: [facility] for HHPT charity program.   Expected Discharge Date:                  Expected Discharge Plan:  Home w Home Health Services  In-House Referral:     Discharge planning Services  CM Consult, Medication Assistance  Post Acute Care Choice:  Home Health Choice offered to:  Patient, Sibling  DME Arranged:    DME Agency:     HH Arranged:  PT HH Agency:  [facility]  Status of Service:  In process, will continue to follow  If discussed at Long Length of Stay Meetings, dates discussed:    Additional Comments:  Lisa M Jacobs, RN 11/04/2017, 2:33 PM  

## 2017-11-05 LAB — BASIC METABOLIC PANEL
ANION GAP: 5 (ref 5–15)
BUN: <5 mg/dL — ABNORMAL LOW (ref 6–20)
CALCIUM: 7.4 mg/dL — AB (ref 8.9–10.3)
CO2: 24 mmol/L (ref 22–32)
Chloride: 101 mmol/L (ref 101–111)
Creatinine, Ser: 0.31 mg/dL — ABNORMAL LOW (ref 0.44–1.00)
GFR calc non Af Amer: >60 mL/min (ref >60)
Glucose, Bld: 108 mg/dL — ABNORMAL HIGH (ref 65–99)
POTASSIUM: 3.3 mmol/L — AB (ref 3.5–5.1)
Sodium: 130 mmol/L — ABNORMAL LOW (ref 135–145)

## 2017-11-05 LAB — GLUCOSE, CAPILLARY: Glucose-Capillary: 97 mg/dL (ref 65–99)

## 2017-11-05 MED ORDER — SODIUM CHLORIDE 1 G PO TABS: 2.0000 g | ORAL_TABLET | Freq: Three times a day (TID) | ORAL | 0 refills | Status: DC

## 2017-11-05 MED ORDER — ENOXAPARIN SODIUM 40 MG/0.4ML ~~LOC~~ SOLN: 40.0000 mg | SUBCUTANEOUS | 0 refills | Status: DC

## 2017-11-05 MED ORDER — OXYCODONE HCL 10 MG PO TABS: 10.0000 mg | ORAL_TABLET | ORAL | 0 refills | Status: DC | PRN

## 2017-11-05 MED ORDER — ONDANSETRON HCL 4 MG PO TABS: 4.0000 mg | ORAL_TABLET | Freq: Four times a day (QID) | ORAL | 0 refills | Status: DC | PRN

## 2017-11-05 MED ORDER — DOCUSATE SODIUM 100 MG PO CAPS: 100.0000 mg | ORAL_CAPSULE | Freq: Two times a day (BID) | ORAL | 0 refills | Status: DC

## 2017-11-05 MED ORDER — METHOCARBAMOL 500 MG PO TABS: 500.0000 mg | ORAL_TABLET | Freq: Four times a day (QID) | ORAL | 0 refills | Status: DC | PRN

## 2017-11-05 MED ORDER — OXYCODONE HCL 5 MG PO TABS: 5.0000 mg | ORAL_TABLET | ORAL | 0 refills | Status: DC | PRN

## 2017-11-05 NOTE — Progress Notes (Signed)
OT Cancellation Note  Patient Details Name: Lori Rangel MRN: [id number] DOB: 1960-09-05   Cancelled Treatment:    Reason Eval/Treat Not Completed: Patient declined, no reason specified. Upon attempt to treat, pt seated in recliner, dressed, with visitor in room. Pt requesting OT to remove her hospital wrist bands so she can "get out of here." Pt informed of OT's role and politely but decisively declining OT treatment at this time.   Jeni Salles, MPH, MS, OTR/L ascom [phone] 11/05/17, 11:46 AM

## 2017-11-05 NOTE — Anesthesia Postprocedure Evaluation (Signed)
Anesthesia Post Note  Patient: Lori Rangel  Procedure(s) Performed: INTRAMEDULLARY (IM) NAIL INTERTROCHANTRIC (Right )  Patient location during evaluation: PACU Anesthesia Type: Spinal Level of consciousness: awake and alert Pain management: pain level controlled Vital Signs Assessment: post-procedure vital signs reviewed and stable Respiratory status: spontaneous breathing, nonlabored ventilation, respiratory function stable and patient connected to nasal cannula oxygen Cardiovascular status: blood pressure returned to baseline and stable Postop Assessment: no apparent nausea or vomiting Anesthetic complications: no     Last Vitals:  Vitals:   11/05/17 0751 11/05/17 0754  BP: (!) 101/51   Pulse: 76   Resp: 16   Temp: 36.9 C   SpO2: 100% 98%    Last Pain:  Vitals:   11/05/17 0751  TempSrc: Oral  PainSc:                  Molli Barrows

## 2017-11-05 NOTE — Progress Notes (Signed)
Physical Therapy Treatment Patient Details Name: Lori Rangel MRN: [id number] DOB: 1960/11/16 Today's Date: 11/05/2017    History of Present Illness Pt. is a 57 y.o. female who was admitted to [facility] for IM Nailing of Right Intertrochanteric Hip Fx. Pt. PMHx includes: Asthma, CA, COPD, and SOB.    PT Comments    Lori Rangel was agitated due to pain but was agreeable to work with therapy.  She completed stair training with min assist and will have assist from family to ascend/descend steps once home.  Pt ambulated 100 ft using RW and min guard assist for safety.  SpO2 91% on RA at rest in supine and drops to 88% on RA with bed mobility.  Improves to 91% sitting EOB. SpO2 as low as 87% on RA with stair training but recovers quickly to 91% on RA with seated rest break.  RN notified.   Follow Up Recommendations  Home health PT;Supervision for mobility/OOB     Equipment Recommendations  Rolling walker with 5" wheels;3in1 (PT)    Recommendations for Other Services       Precautions / Restrictions Precautions Precautions: Fall;Other (comment) Precaution Comments: Monitor O2 Restrictions Weight Bearing Restrictions: Yes RLE Weight Bearing: Weight bearing as tolerated    Mobility  Bed Mobility Overal bed mobility: Needs Assistance Bed Mobility: Supine to Sit     Supine to sit: Min guard;HOB elevated     General bed mobility comments: Cues for leg hook technique to assist RLE OOB.  Pt requires increased time and effort.  No use of bed rail this session.  SpO2 91% on RA at rest in supine and drops to 88% on RA with bed mobility.  Improves to 91% sitting EOB.   Transfers Overall transfer level: Needs assistance Equipment used: Rolling walker (2 wheeled) Transfers: Sit to/from Stand Sit to Stand: Min guard         General transfer comment: Pt demonstrates proper technique and hand placement.  Pt is slow to stand.  Well controlled descent to sit.    Ambulation/Gait Ambulation/Gait assistance: Min guard Ambulation Distance (Feet): 100 Feet Assistive device: Rolling walker (2 wheeled) Gait Pattern/deviations: Step-to pattern;Decreased stride length;Decreased stance time - right;Decreased step length - left;Decreased weight shift to right;Antalgic;Trunk flexed;Step-through pattern Gait velocity: decreased Gait velocity interpretation: Below normal speed for age/gender General Gait Details: Pt initially with step to gait pattern and transitions to a step through gait pattern.  SpO2 remains at or above 89% on RA while ambulating.    Stairs Stairs: Yes   Stair Management: One rail Left;Step to pattern;Forwards Number of Stairs: 4 General stair comments: Pt requires 1 person HHA with ascent and descent in addition to using L railing.  SpO2 as low as 87% on RA with stair training but recovers quickly to 91% on RA with seated rest break.  RN notified.  Wheelchair Mobility    Modified Rankin (Stroke Patients Only)       Balance Overall balance assessment: Needs assistance Sitting-balance support: No upper extremity supported;Feet supported Sitting balance-Leahy Scale: Good     Standing balance support: Single extremity supported;During functional activity Standing balance-Leahy Scale: Poor Standing balance comment: Relies on UE support for static and dynamic activities                            Cognition Arousal/Alertness: Awake/alert Behavior During Therapy: Anxious;WFL for tasks assessed/performed;Agitated Overall Cognitive Status: Within Functional Limits for tasks assessed  Exercises Total Joint Exercises Ankle Circles/Pumps: AROM;Both;10 reps;Supine Quad Sets: Strengthening;10 reps;Supine;Both Heel Slides: AAROM;Strengthening;Right;10 reps;Supine Hip ABduction/ADduction: AAROM;Right;10 reps;Supine;Strengthening    General Comments         Pertinent Vitals/Pain Pain Assessment: Faces Faces Pain Scale: Hurts whole lot Pain Location: R hip Pain Descriptors / Indicators: Aching;Grimacing;Guarding;Moaning Pain Intervention(s): Limited activity within patient's tolerance;Monitored during session;Repositioned;Utilized relaxation techniques    Home Living                      Prior Function            PT Goals (current goals can now be found in the care plan section) Acute Rehab PT Goals Patient Stated Goal: to go home at d/c PT Goal Formulation: With patient Time For Goal Achievement: 11/18/17 Potential to Achieve Goals: Good Progress towards PT goals: Progressing toward goals    Frequency    BID      PT Plan Current plan remains appropriate    Co-evaluation              AM-PAC PT "6 Clicks" Daily Activity  Outcome Measure  Difficulty turning over in bed (including adjusting bedclothes, sheets and blankets)?: A Lot Difficulty moving from lying on back to sitting on the side of the bed? : A Lot Difficulty sitting down on and standing up from a chair with arms (e.g., wheelchair, bedside commode, etc,.)?: A Lot Help needed moving to and from a bed to chair (including a wheelchair)?: A Little Help needed walking in hospital room?: A Little Help needed climbing 3-5 steps with a railing? : A Lot 6 Click Score: 14    End of Session Equipment Utilized During Treatment: Gait belt Activity Tolerance: Patient tolerated treatment well;Patient limited by pain Patient left: with call bell/phone within reach;in chair;with chair alarm set Nurse Communication: Mobility status;Other (comment)(SpO2) PT Visit Diagnosis: Pain;Unsteadiness on feet (R26.81);Other abnormalities of gait and mobility (R26.89);Muscle weakness (generalized) (M62.81);Difficulty in walking, not elsewhere classified (R26.2) Pain - Right/Left: Right Pain - part of body: Hip     Time: 1898-4210 PT Time Calculation (min) (ACUTE ONLY):  24 min  Charges:  $Gait Training: 23-37 mins                    G Codes:       Collie Siad PT, DPT 11/05/2017, 10:28 AM

## 2017-11-05 NOTE — Progress Notes (Signed)
  Subjective: 2 Days Post-Op Procedure(s) (LRB): INTRAMEDULLARY (IM) NAIL INTERTROCHANTRIC (Right) Patient reports pain as mild to moderate.   Patient seen in rounds with Dr. Posey Pronto. Patient is well, and has had no acute complaints or problems Plan is to go home after hospital stay. Negative for chest pain and shortness of breath Fever: no Gastrointestinal: Negative for nausea and vomiting  Objective: Vital signs in last 24 hours: Temp:  [98.1 F (36.7 C)-98.6 F (37 C)] 98.1 F (36.7 C) (03/06 0400) Pulse Rate:  [70-84] 70 (03/06 0400) Resp:  [15-20] 15 (03/06 0400) BP: (96-115)/(50-58) 103/52 (03/06 0400) SpO2:  [95 %-100 %] 100 % (03/06 0400) Weight:  [49.9 kg (110 lb)] 49.9 kg (110 lb) (03/06 0400)  Intake/Output from previous day:  Intake/Output Summary (Last 24 hours) at 11/05/2017 0710 Last data filed at 11/04/2017 1130 Gross per 24 hour  Intake 240 ml  Output 150 ml  Net 90 ml    Intake/Output this shift: No intake/output data recorded.  Labs: Recent Labs    11/03/17 0102 11/03/17 0406 11/04/17 0336  HGB 13.1 11.7* 9.0*   Recent Labs    11/03/17 0406 11/04/17 0336  WBC 7.5 6.4  RBC 3.16* 2.45*  HCT 34.0* 26.7*  PLT 311 249   Recent Labs    11/04/17 0336 11/05/17 0359  NA 129* 130*  K 3.7 3.3*  CL 100* 101  CO2 22 24  BUN <5* <5*  CREATININE 0.44 0.31*  GLUCOSE 117* 108*  CALCIUM 7.1* 7.4*   Recent Labs    11/03/17 0102  INR 0.96     EXAM General - Patient is Alert and Oriented Extremity - Neurovascular intact Sensation intact distally Dorsiflexion/Plantar flexion intact Compartment soft Dressing/Incision - clean, dry, no drainage Motor Function - intact, moving foot and toes well on exam. The patient ambulated 80 feet with physical therapy.  Past Medical History:  Diagnosis Date  . Asthma   . Cancer ([place])   . COPD (chronic obstructive pulmonary disease) ([place])   . Shortness of breath dyspnea   . Weight decrease major recent  weight loss    Assessment/Plan: 2 Days Post-Op Procedure(s) (LRB): INTRAMEDULLARY (IM) NAIL INTERTROCHANTRIC (Right) Active Problems:   Closed right hip fracture (HCC)  Estimated body mass index is 20.12 kg/m as calculated from the following:   Height as of this encounter: 5\' 2"  (1.575 m).   Weight as of this encounter: 49.9 kg (110 lb). Advance diet Up with therapy D/C IV fluids  Discharge planning to go home today or tomorrow, depending on medicine service  DVT Prophylaxis - Lovenox, Foot Pumps and TED hose Weight-Bearing as tolerated to right leg  Reche Dixon, PA-C Orthopaedic Surgery 11/05/2017, 7:10 AM

## 2017-11-05 NOTE — Care Management (Addendum)
Correction, sisters name is Lori Rangel [phone] call for any communication. Lori Rangel with [facility] updated. Patient discharging today. She will get HHPT through [facility] program

## 2017-11-05 NOTE — Discharge Summary (Signed)
[facility] at [place] NAME: Lori Rangel    MR#:  [id number]  DATE OF BIRTH:  1961/02/24  DATE OF ADMISSION:  11/03/2017 ADMITTING PHYSICIAN: Amelia Jo, MD  DATE OF DISCHARGE: No discharge date for patient encounter.  PRIMARY CARE PHYSICIAN: Kirk Ruths, MD    ADMISSION DIAGNOSIS:  Alcohol abuse [F10.10] Hyponatremia [E87.1] Closed displaced intertrochanteric fracture of right femur, initial encounter ([place]) [N56.213Y] Alcoholic intoxication with complication ([place]) [Q65.784]  DISCHARGE DIAGNOSIS:  Active Problems:   Closed right hip fracture ([place])   SECONDARY DIAGNOSIS:   Past Medical History:  Diagnosis Date  . Asthma   . Cancer ([place])   . COPD (chronic obstructive pulmonary disease) ([place])   . Shortness of breath dyspnea   . Weight decrease major recent weight loss    HOSPITAL COURSE:  1. 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 Treated with IV fluids for gentle rehydration and salt tabs, sodium 130 at discharge  3.Recurrent lung cancer Stable We will need to follow-up with oncology/primary care provider status post discharge for continued care/management   4.Alcohol abuse, acute on chronic Treated on our alcohol withdrawal protocol while in house   5.Tobacco abuse, acute on chronic Cessation counseling and nicotine patch daily  6.COPD without exacerbation  Stable BTs prn  DISCHARGE CONDITIONS:  On day of discharge patient is afebrile, hemodynamically stable, tolerating diet, ready for discharge home with appropriate follow-up with primary care provider and orthopedic surgery, for more specific details please see chart   CONSULTS OBTAINED:  Treatment Team:  Leim Fabry, MD Salary, Avel Peace, MD  DRUG ALLERGIES:   Allergies  Allergen Reactions  . Levaquin [Levofloxacin] Nausea Only    Cramps & Body aches    DISCHARGE MEDICATIONS:   Allergies as of 11/05/2017      Reactions   Levaquin [levofloxacin] Nausea Only   Cramps & Body aches      Medication List    TAKE these medications   aspirin EC 81 MG tablet Take 81 mg by mouth daily.   budesonide-formoterol 160-4.5 MCG/ACT inhaler Commonly known as:  SYMBICORT Inhale 2 puffs into the lungs 2 (two) times daily.   docusate sodium 100 MG capsule Commonly known as:  COLACE Take 1 capsule (100 mg total) by mouth 2 (two) times daily.   enoxaparin 40 MG/0.4ML injection Commonly known as:  LOVENOX Inject 0.4 mLs (40 mg total) into the skin daily.   fluticasone 110 MCG/ACT inhaler Commonly known as:  FLOVENT HFA Inhale 2 puffs into the lungs 2 (two) times daily.   guaiFENesin 100 MG/5ML Soln Commonly known as:  ROBITUSSIN Take 5 mLs (100 mg total) by mouth every 4 (four) hours as needed for cough or to loosen phlegm.   megestrol 400 MG/10ML suspension Commonly known as:  MEGACE Take 10 mLs (400 mg total) by mouth 2 (two) times daily.   methocarbamol 500 MG tablet Commonly known as:  ROBAXIN Take 1 tablet (500 mg total) by mouth every 6 (six) hours as needed for muscle spasms.   mirtazapine 30 MG disintegrating tablet Commonly known as:  REMERON SOL-TAB Take 30 mg by mouth. Pt takes 1/4th tablet by mouth daily   ondansetron 4 MG tablet Commonly known as:  ZOFRAN Take 1 tablet (4 mg total) by mouth every  6 (six) hours as needed for nausea.   oxyCODONE 5 MG immediate release tablet Commonly known as:  Oxy IR/ROXICODONE Take 1-2 tablets (5-10 mg total) by mouth every 4 (four) hours as needed for moderate pain (pain score 4-6).   Oxycodone HCl 10 MG Tabs Take 1-1.5 tablets (10-15 mg total) by mouth every 4 (four) hours as needed for severe pain (pain score 7-10).   sodium  chloride 1 g tablet Take 2 tablets (2 g total) by mouth 3 (three) times daily with meals.   varenicline 1 MG tablet Commonly known as:  CHANTIX CONTINUING MONTH PAK Take 1 tablet (1 mg total) by mouth 2 (two) times daily.   VENTOLIN HFA 108 (90 Base) MCG/ACT inhaler Generic drug:  albuterol INHALE 2 PUFFS INTO LUNGS EVERY 6 HOURS AS NEEDED FOR WHEEZING OR SHORTNESS OF BREATH            Durable Medical Equipment  (From admission, onward)        Start     Ordered   11/05/17 1105  For home use only DME Gilford Rile  [facility])  Once    Comments:  Rolling walker with 5 inch wheels  Question:  Patient needs a walker to treat with the following condition  Answer:  Hip fracture ([place])   11/05/17 1105       DISCHARGE INSTRUCTIONS:    If you experience worsening of your admission symptoms, develop shortness of breath, life threatening emergency, suicidal or homicidal thoughts you must seek medical attention immediately by calling 911 or calling your MD immediately  if symptoms less severe.  You Must read complete instructions/literature along with all the possible adverse reactions/side effects for all the Medicines you take and that have been prescribed to you. Take any new Medicines after you have completely understood and accept all the possible adverse reactions/side effects.   Please note  You were cared for by a hospitalist during your hospital stay. If you have any questions about your discharge medications or the care you received while you were in the hospital after you are discharged, you can call the unit and asked to speak with the hospitalist on call if the hospitalist that took care of you is not available. Once you are discharged, your primary care physician will handle any further medical issues. Please note that NO REFILLS for any discharge medications will be authorized once you are discharged, as it is imperative that you return to your primary care physician (or establish a  relationship with a primary care physician if you do not have one) for your aftercare needs so that they can reassess your need for medications and monitor your lab values.    Today   CHIEF COMPLAINT:   Chief Complaint  Patient presents with  . Hip Pain    HISTORY OF PRESENT ILLNESS:  57 y.o. female with a known history of COPD, PAD, tobacco and alcohol abuse, and most recently diagnosed recurrent lung cancer.  Patient was initially diagnosed with lung cancer in 2017 and she underwent removal of the small lesion by [facility].  At the recent follow-up, she was told that the lung mass came back in the same spot.  This time she is recommended lobectomy, which was not scheduled yet, patient has to undergo some more testing. Patient was brought to emergency room this time, for right hip fracture status post mechanical fall at home.  She is usually very active and able to ambulate more than 400 feet without any chest  pain.  Blood work done in the emergency room was remarkable for elevated alcohol level at 234 and low sodium level at 123. EKG reviewed by myself, shows normal sinus rhythm with heart rate at 69; no acute changes. Chest x-ray, reviewed by myself shows apparent interval increase in size of a left upper lobe lung nodule, but no acute infiltrate or effusion.  Pelvis x-ray shows acute, slightly comminuted displaced and angulated right intertrochanteric fracture. Patient is admitted for right hip fracture surgical repair.  VITAL SIGNS:  Blood pressure (!) 101/51, pulse 76, temperature 98.4 F (36.9 C), temperature source Oral, resp. rate 16, height 5\' 2"  (1.575 m), weight 49.9 kg (110 lb), SpO2 98 %.  I/O:    Intake/Output Summary (Last 24 hours) at 11/05/2017 1106 Last data filed at 11/04/2017 1130 Gross per 24 hour  Intake -  Output 150 ml  Net -150 ml    PHYSICAL EXAMINATION:  GENERAL:  57 y.o.-year-old patient lying in the bed with no acute distress.  EYES: Pupils equal,  round, reactive to light and accommodation. No scleral icterus. Extraocular muscles intact.  HEENT: Head atraumatic, normocephalic. Oropharynx and nasopharynx clear.  NECK:  Supple, no jugular venous distention. No thyroid enlargement, no tenderness.  LUNGS: Normal breath sounds bilaterally, no wheezing, rales,rhonchi or crepitation. No use of accessory muscles of respiration.  CARDIOVASCULAR: S1, S2 normal. No murmurs, rubs, or gallops.  ABDOMEN: Soft, non-tender, non-distended. Bowel sounds present. No organomegaly or mass.  EXTREMITIES: No pedal edema, cyanosis, or clubbing.  NEUROLOGIC: Cranial nerves II through XII are intact. Muscle strength 5/5 in all extremities. Sensation intact. Gait not checked.  PSYCHIATRIC: The patient is alert and oriented x 3.  SKIN: No obvious rash, lesion, or ulcer.   DATA REVIEW:   CBC Recent Labs  Lab 11/04/17 0336  WBC 6.4  HGB 9.0*  HCT 26.7*  PLT 249    Chemistries  Recent Labs  Lab 11/03/17 0102  11/05/17 0359  NA 123*   < > 130*  K 3.8   < > 3.3*  CL 89*   < > 101  CO2 20*   < > 24  GLUCOSE 107*   < > 108*  BUN <5*   < > <5*  CREATININE 0.40*   < > 0.31*  CALCIUM 7.9*   < > 7.4*  AST 55*  --   --   ALT 29  --   --   ALKPHOS 220*  --   --   BILITOT 0.7  --   --    < > = values in this interval not displayed.    Cardiac Enzymes No results for input(s): TROPONINI in the last 168 hours.  Microbiology Results  Results for orders placed or performed during the hospital encounter of 11/03/17  Surgical PCR screen     Status: Abnormal   Collection Time: 11/03/17  5:47 AM  Result Value Ref Range Status   MRSA, PCR NEGATIVE NEGATIVE Final   Staphylococcus aureus POSITIVE (A) NEGATIVE Final    Comment: (NOTE) The Xpert SA Assay (FDA approved for NASAL specimens in patients 88 years of age and older), is one component of a comprehensive surveillance program. It is not intended to diagnose infection nor to guide or monitor  treatment. Performed at [facility], [address], [place], [place] [address]     RADIOLOGY:  Dg Hip Operative Unilat W Or W/o Pelvis Right  Result Date: 11/03/2017 CLINICAL DATA:  57 year old female with right intertrochanteric  fracture. Subsequent encounter. EXAM: OPERATIVE right HIP (WITH PELVIS IF PERFORMED) 5 VIEWS TECHNIQUE: Fluoroscopic spot image(s) were submitted for interpretation post-operatively. COMPARISON:  11/03/2017 plain film exam. FINDINGS: Right intertrochanteric fracture reduced with long femoral rod with distal fixation screws and proximal sliding type screw. IMPRESSION: Open reduction and internal fixation right intertrochanteric fracture. Electronically Signed   By: Genia Del M.D.   On: 11/03/2017 19:30    EKG:   Orders placed or performed during the hospital encounter of 11/03/17  . ED EKG  . ED EKG  . ED EKG  . ED EKG  . EKG 12-Lead  . EKG 12-Lead      Management plans discussed with the patient, family and they are in agreement.  CODE STATUS:     Code Status Orders  (From admission, onward)        Start     Ordered   11/03/17 0338  Full code  Continuous     11/03/17 0337    Code Status History    Date Active Date Inactive Code Status Order ID Comments User Context   12/15/2015 14:47 12/17/2015 13:58 Full Code [id number]  Henreitta Leber, MD Inpatient    Advance Directive Documentation     Most Recent Value  Type of Advance Directive  Living will  Pre-existing out of facility DNR order (yellow form or pink MOST form)  No data  "MOST" Form in Place?  No data      TOTAL TIME TAKING CARE OF THIS PATIENT: 45 minutes.    Avel Peace Salary M.D on 11/05/2017 at 11:06 AM  Between 7am to 6pm - Pager - [phone]  After 6pm go to [email or web address] - password [email or web address] [facility]  Office  [phone]  CC: Primary care physician; Kirk Ruths, MD   Note: This dictation was prepared with Dragon  dictation along with smaller phrase technology. Any transcriptional errors that result from this process are unintentional.

## 2018-06-03 DIAGNOSIS — Z9989 Dependence on other enabling machines and devices: Secondary | ICD-10-CM | POA: Insufficient documentation

## 2018-07-22 ENCOUNTER — Other Ambulatory Visit
Admission: RE | Admit: 2018-07-22 | Discharge: 2018-07-22 | Disposition: A | Discharge status: Home / Self Care | Payer: Medicare Other | Source: Ambulatory Visit | Attending: Internal Medicine | Admitting: Internal Medicine

## 2018-07-22 ENCOUNTER — Ambulatory Visit
Admission: RE | Admit: 2018-07-22 | Discharge: 2018-07-22 | Disposition: A | Discharge status: Home / Self Care | Payer: Medicare Other | Source: Ambulatory Visit | Attending: Internal Medicine | Admitting: Internal Medicine

## 2018-07-22 ENCOUNTER — Other Ambulatory Visit: Payer: Self-pay | Admitting: Internal Medicine

## 2018-07-22 DIAGNOSIS — R7989 Other specified abnormal findings of blood chemistry: Secondary | ICD-10-CM

## 2018-07-22 DIAGNOSIS — R600 Localized edema: Secondary | ICD-10-CM | POA: Insufficient documentation

## 2018-07-22 LAB — FIBRIN DERIVATIVES D-DIMER (ARMC ONLY): FIBRIN DERIVATIVES D-DIMER (ARMC): 1133.14 ng{FEU}/mL — AB (ref 0.00–499.00)

## 2018-08-25 ENCOUNTER — Other Ambulatory Visit: Payer: Self-pay

## 2018-08-25 ENCOUNTER — Inpatient Hospital Stay
Admission: EM | Admit: 2018-08-25 | Discharge: 2018-08-29 | DRG: 640 | Disposition: A | Discharge status: Skilled Nursing Facility | Payer: Medicare Other | Attending: Internal Medicine | Admitting: Internal Medicine

## 2018-08-25 ENCOUNTER — Encounter: Payer: Self-pay | Admitting: Emergency Medicine

## 2018-08-25 ENCOUNTER — Emergency Department: Payer: Medicare Other

## 2018-08-25 DIAGNOSIS — D539 Nutritional anemia, unspecified: Secondary | ICD-10-CM | POA: Diagnosis present

## 2018-08-25 DIAGNOSIS — F17210 Nicotine dependence, cigarettes, uncomplicated: Secondary | ICD-10-CM | POA: Diagnosis present

## 2018-08-25 DIAGNOSIS — B9620 Unspecified Escherichia coli [E. coli] as the cause of diseases classified elsewhere: Secondary | ICD-10-CM | POA: Diagnosis present

## 2018-08-25 DIAGNOSIS — R531 Weakness: Secondary | ICD-10-CM

## 2018-08-25 DIAGNOSIS — J449 Chronic obstructive pulmonary disease, unspecified: Secondary | ICD-10-CM | POA: Diagnosis present

## 2018-08-25 DIAGNOSIS — W19XXXA Unspecified fall, initial encounter: Secondary | ICD-10-CM

## 2018-08-25 DIAGNOSIS — Z881 Allergy status to other antibiotic agents status: Secondary | ICD-10-CM

## 2018-08-25 DIAGNOSIS — Z681 Body mass index (BMI) 19 or less, adult: Secondary | ICD-10-CM

## 2018-08-25 DIAGNOSIS — Z79899 Other long term (current) drug therapy: Secondary | ICD-10-CM

## 2018-08-25 DIAGNOSIS — E876 Hypokalemia: Secondary | ICD-10-CM | POA: Diagnosis present

## 2018-08-25 DIAGNOSIS — E43 Unspecified severe protein-calorie malnutrition: Secondary | ICD-10-CM | POA: Diagnosis present

## 2018-08-25 DIAGNOSIS — F10239 Alcohol dependence with withdrawal, unspecified: Secondary | ICD-10-CM | POA: Diagnosis present

## 2018-08-25 DIAGNOSIS — Z7951 Long term (current) use of inhaled steroids: Secondary | ICD-10-CM

## 2018-08-25 DIAGNOSIS — E039 Hypothyroidism, unspecified: Secondary | ICD-10-CM | POA: Diagnosis present

## 2018-08-25 DIAGNOSIS — E861 Hypovolemia: Secondary | ICD-10-CM | POA: Diagnosis present

## 2018-08-25 DIAGNOSIS — Z85118 Personal history of other malignant neoplasm of bronchus and lung: Secondary | ICD-10-CM

## 2018-08-25 DIAGNOSIS — Z7982 Long term (current) use of aspirin: Secondary | ICD-10-CM

## 2018-08-25 DIAGNOSIS — E871 Hypo-osmolality and hyponatremia: Secondary | ICD-10-CM | POA: Diagnosis not present

## 2018-08-25 DIAGNOSIS — R627 Adult failure to thrive: Secondary | ICD-10-CM | POA: Diagnosis present

## 2018-08-25 DIAGNOSIS — Z79891 Long term (current) use of opiate analgesic: Secondary | ICD-10-CM

## 2018-08-25 DIAGNOSIS — N390 Urinary tract infection, site not specified: Secondary | ICD-10-CM | POA: Diagnosis present

## 2018-08-25 DIAGNOSIS — Z902 Acquired absence of lung [part of]: Secondary | ICD-10-CM

## 2018-08-25 LAB — CBC
HEMATOCRIT: 24 % — AB (ref 36.0–46.0)
HEMOGLOBIN: 8.6 g/dL — AB (ref 12.0–15.0)
MCH: 36.3 pg — ABNORMAL HIGH (ref 26.0–34.0)
MCHC: 35.8 g/dL (ref 30.0–36.0)
MCV: 101.3 fL — AB (ref 80.0–100.0)
Platelets: 208 10*3/uL (ref 150–400)
RBC: 2.37 MIL/uL — ABNORMAL LOW (ref 3.87–5.11)
RDW: 14.3 % (ref 11.5–15.5)
WBC: 2.9 10*3/uL — ABNORMAL LOW (ref 4.0–10.5)
nRBC: 0 % (ref 0.0–0.2)

## 2018-08-25 LAB — URINALYSIS, COMPLETE (UACMP) WITH MICROSCOPIC
BILIRUBIN URINE: NEGATIVE
Glucose, UA: NEGATIVE mg/dL
Hgb urine dipstick: NEGATIVE
KETONES UR: 5 mg/dL — AB
Nitrite: NEGATIVE
PROTEIN: NEGATIVE mg/dL
Specific Gravity, Urine: 1.005 (ref 1.005–1.030)
pH: 6 (ref 5.0–8.0)

## 2018-08-25 LAB — BASIC METABOLIC PANEL
Anion gap: 10 (ref 5–15)
CHLORIDE: 91 mmol/L — AB (ref 98–111)
CO2: 24 mmol/L (ref 22–32)
Calcium: 7.6 mg/dL — ABNORMAL LOW (ref 8.9–10.3)
Creatinine, Ser: 0.32 mg/dL — ABNORMAL LOW (ref 0.44–1.00)
GFR calc Af Amer: >60 mL/min (ref >60)
Glucose, Bld: 78 mg/dL (ref 70–99)
POTASSIUM: 3.2 mmol/L — AB (ref 3.5–5.1)
Sodium: 125 mmol/L — ABNORMAL LOW (ref 135–145)

## 2018-08-25 LAB — TROPONIN I: Troponin I: <0.03 ng/mL (ref <0.03)

## 2018-08-25 LAB — MAGNESIUM: MAGNESIUM: 1.7 mg/dL (ref 1.7–2.4)

## 2018-08-25 MED ORDER — ASPIRIN EC 81 MG PO TBEC
81.0000 mg | DELAYED_RELEASE_TABLET | Freq: Every day | ORAL | Status: DC
  Administered 2018-08-26 – 2018-08-29 (×4): 81 mg via ORAL
  Filled 2018-08-25 (×4): qty 1

## 2018-08-25 MED ORDER — LORAZEPAM 1 MG PO TABS
1.0000 mg | ORAL_TABLET | Freq: Four times a day (QID) | ORAL | Status: AC | PRN
  Administered 2018-08-26: 15:00:00 1 mg via ORAL
  Filled 2018-08-25: qty 1

## 2018-08-25 MED ORDER — MOMETASONE FURO-FORMOTEROL FUM 200-5 MCG/ACT IN AERO
2.0000 | INHALATION_SPRAY | Freq: Two times a day (BID) | RESPIRATORY_TRACT | Status: DC
  Administered 2018-08-25 – 2018-08-29 (×8): 2 via RESPIRATORY_TRACT
  Filled 2018-08-25: qty 8.8

## 2018-08-25 MED ORDER — MAGNESIUM SULFATE 2 GM/50ML IV SOLN
2.0000 g | Freq: Once | INTRAVENOUS | Status: AC
  Administered 2018-08-25: 20:00:00 2 g via INTRAVENOUS
  Filled 2018-08-25 (×2): qty 50

## 2018-08-25 MED ORDER — POLYETHYLENE GLYCOL 3350 17 G PO PACK: 17.0000 g | PACK | Freq: Every day | ORAL | Status: DC | PRN

## 2018-08-25 MED ORDER — METHOCARBAMOL 500 MG PO TABS
500.0000 mg | ORAL_TABLET | Freq: Four times a day (QID) | ORAL | Status: DC | PRN
Start: 2018-08-25 — End: 2018-08-29
  Filled 2018-08-25: qty 1

## 2018-08-25 MED ORDER — ENOXAPARIN SODIUM 40 MG/0.4ML ~~LOC~~ SOLN
40.0000 mg | SUBCUTANEOUS | Status: DC
  Filled 2018-08-25: qty 0.4

## 2018-08-25 MED ORDER — SODIUM CHLORIDE 0.9 % IV SOLN: Freq: Once | INTRAVENOUS | Status: DC

## 2018-08-25 MED ORDER — DOCUSATE SODIUM 100 MG PO CAPS
100.0000 mg | ORAL_CAPSULE | Freq: Two times a day (BID) | ORAL | Status: DC
  Administered 2018-08-26 – 2018-08-29 (×4): 100 mg via ORAL
  Filled 2018-08-25 (×5): qty 1

## 2018-08-25 MED ORDER — VITAMIN B-1 100 MG PO TABS
100.0000 mg | ORAL_TABLET | Freq: Every day | ORAL | Status: DC
  Administered 2018-08-25 – 2018-08-29 (×5): 100 mg via ORAL
  Filled 2018-08-25 (×5): qty 1

## 2018-08-25 MED ORDER — SODIUM CHLORIDE 0.9 % IV BOLUS: 1000.0000 mL | Freq: Once | INTRAVENOUS | Status: DC

## 2018-08-25 MED ORDER — LORAZEPAM 2 MG/ML IJ SOLN
1.0000 mg | Freq: Four times a day (QID) | INTRAMUSCULAR | Status: AC | PRN
  Administered 2018-08-26 (×2): 1 mg via INTRAVENOUS
  Filled 2018-08-25 (×2): qty 1

## 2018-08-25 MED ORDER — ACETAMINOPHEN 325 MG PO TABS: 650.0000 mg | ORAL_TABLET | Freq: Four times a day (QID) | ORAL | Status: DC | PRN

## 2018-08-25 MED ORDER — ONDANSETRON HCL 4 MG PO TABS
4.0000 mg | ORAL_TABLET | Freq: Four times a day (QID) | ORAL | Status: DC | PRN
  Administered 2018-08-27: 17:00:00 4 mg via ORAL
  Filled 2018-08-25: qty 1

## 2018-08-25 MED ORDER — POTASSIUM CHLORIDE CRYS ER 20 MEQ PO TBCR
40.0000 meq | EXTENDED_RELEASE_TABLET | Freq: Once | ORAL | Status: DC
  Filled 2018-08-25: qty 4

## 2018-08-25 MED ORDER — ACETAMINOPHEN 650 MG RE SUPP: 650.0000 mg | Freq: Four times a day (QID) | RECTAL | Status: DC | PRN

## 2018-08-25 MED ORDER — ENOXAPARIN SODIUM 30 MG/0.3ML ~~LOC~~ SOLN
30.0000 mg | SUBCUTANEOUS | Status: DC
  Administered 2018-08-25: 20:00:00 30 mg via SUBCUTANEOUS
  Filled 2018-08-25: qty 0.3

## 2018-08-25 MED ORDER — ADULT MULTIVITAMIN W/MINERALS CH
1.0000 | ORAL_TABLET | Freq: Every day | ORAL | Status: DC
  Administered 2018-08-26 – 2018-08-29 (×4): 1 via ORAL
  Filled 2018-08-25 (×4): qty 1

## 2018-08-25 MED ORDER — ONDANSETRON HCL 4 MG/2ML IJ SOLN: 4.0000 mg | Freq: Four times a day (QID) | INTRAMUSCULAR | Status: DC | PRN

## 2018-08-25 MED ORDER — FOLIC ACID 1 MG PO TABS
1.0000 mg | ORAL_TABLET | Freq: Every day | ORAL | Status: DC
  Administered 2018-08-25 – 2018-08-29 (×5): 1 mg via ORAL
  Filled 2018-08-25 (×5): qty 1

## 2018-08-25 MED ORDER — POTASSIUM CHLORIDE 20 MEQ PO PACK
40.0000 meq | PACK | Freq: Once | ORAL | Status: AC
  Administered 2018-08-25: 40 meq via ORAL
  Filled 2018-08-25: qty 2

## 2018-08-25 MED ORDER — SODIUM CHLORIDE 0.9 % IV SOLN
INTRAVENOUS | Status: AC
  Administered 2018-08-25 – 2018-08-26 (×2): via INTRAVENOUS

## 2018-08-25 MED ORDER — THIAMINE HCL 100 MG/ML IJ SOLN
100.0000 mg | Freq: Every day | INTRAMUSCULAR | Status: DC
  Filled 2018-08-25 (×5): qty 1

## 2018-08-25 MED ORDER — ALBUTEROL SULFATE (2.5 MG/3ML) 0.083% IN NEBU: 2.5000 mg | INHALATION_SOLUTION | Freq: Four times a day (QID) | RESPIRATORY_TRACT | Status: DC | PRN

## 2018-08-25 MED ORDER — POTASSIUM CHLORIDE 10 MEQ/100ML IV SOLN
10.0000 meq | Freq: Once | INTRAVENOUS | Status: AC
  Administered 2018-08-25: 10 meq via INTRAVENOUS
  Filled 2018-08-25: qty 100

## 2018-08-25 NOTE — H&P (Signed)
[place] at [place] NAME: Lori Rangel    MR#:  [id number]  DATE OF BIRTH:  07-24-1961  DATE OF ADMISSION:  08/25/2018  PRIMARY CARE PHYSICIAN: Kirk Ruths, MD   REQUESTING/REFERRING PHYSICIAN: Harvest Dark, MD  CHIEF COMPLAINT:   Chief Complaint  Patient presents with  . Weakness  . Fall    HISTORY OF PRESENT ILLNESS:  Lori Rangel  is a 57 y.o. female with a known history of lung cancer s/p left upper lobectomy 12/2017 and COPD who presented to the ED with poor appetite and generalized weakness for the last 9 months.  Her symptoms have gotten much worse over the last week, so her family brought her into the emergency department.  She lives alone and her daughters check on her as often as they can.  She has gotten to the point where she has been unable to walk for the last 3 days.  She denies any chest pain, shortness of breath, belly pain, fever, chills.  In the ED, vitals were unremarkable.  Labs were significant for sodium 125, K3.2, hemoglobin 8.6, WBC 2.9.  Hospitalists were called for admission for adult failure to thrive.  PAST MEDICAL HISTORY:   Past Medical History:  Diagnosis Date  . Asthma   . Cancer ([place])    lung  . COPD (chronic obstructive pulmonary disease) ([place])   . Shortness of breath dyspnea   . Weight decrease major recent weight loss    PAST SURGICAL HISTORY:   Past Surgical History:  Procedure Laterality Date  . BREAST BIOPSY Right   . COLONOSCOPY    . ELECTROMAGNETIC NAVIGATION BROCHOSCOPY N/A 01/02/2016   Procedure: ELECTROMAGNETIC NAVIGATION BRONCHOSCOPY;  Surgeon: Flora Lipps, MD;  Location: [facility] ORS;  Service: Cardiopulmonary;  Laterality: N/A;  . INTRAMEDULLARY (IM) NAIL INTERTROCHANTERIC Right 11/03/2017   Procedure: INTRAMEDULLARY (IM) NAIL INTERTROCHANTRIC;  Surgeon: Leim Fabry, MD;  Location: [facility] ORS;  Service: Orthopedics;  Laterality: Right;    SOCIAL HISTORY:   Social  History   Tobacco Use  . Smoking status: Current Every Day Smoker    Packs/day: 0.75    Years: 40.00    Pack years: 30.00    Types: Cigarettes  . Smokeless tobacco: Never Used  Substance Use Topics  . Alcohol use: Yes    Alcohol/week: 6.0 standard drinks    Types: 6 Cans of beer per week    Comment: 6 beers daily    FAMILY HISTORY:   Family History  Problem Relation Age of Onset  . Cirrhosis Father     DRUG ALLERGIES:   Allergies  Allergen Reactions  . Levaquin [Levofloxacin] Nausea Only    Cramps & Body aches    REVIEW OF SYSTEMS:   Review of Systems  Constitutional: Positive for malaise/fatigue and weight loss. Negative for chills and fever.  HENT: Negative for congestion and sore throat.   Eyes: Negative for blurred vision and double vision.  Respiratory: Negative for cough and shortness of breath.   Cardiovascular: Negative for chest pain, palpitations and leg swelling.  Gastrointestinal: Positive for nausea. Negative for abdominal pain and vomiting.  Genitourinary: Negative for dysuria and urgency.  Musculoskeletal: Positive for falls. Negative for joint pain.  Neurological: Positive for weakness. Negative for dizziness, focal weakness and headaches.  Psychiatric/Behavioral: Negative for depression. The patient is not nervous/anxious.     MEDICATIONS AT HOME:   Prior to Admission medications   Medication Sig Start Date End Date Taking? Authorizing  Provider  aspirin EC 81 MG tablet Take 81 mg by mouth daily.    [provider]  budesonide-formoterol (SYMBICORT) 160-4.5 MCG/ACT inhaler Inhale 2 puffs into the lungs 2 (two) times daily. 12/21/15 01/02/16  Laverle Hobby, MD  docusate sodium (COLACE) 100 MG capsule Take 1 capsule (100 mg total) by mouth 2 (two) times daily. 11/05/17   Salary, Avel Peace, MD  enoxaparin (LOVENOX) 40 MG/0.4ML injection Inject 0.4 mLs (40 mg total) into the skin daily. 11/05/17   Reche Dixon, PA-C  fluticasone (FLOVENT HFA)  110 MCG/ACT inhaler Inhale 2 puffs into the lungs 2 (two) times daily. 01/31/17 01/31/18  [provider]  guaiFENesin (ROBITUSSIN) 100 MG/5ML SOLN Take 5 mLs (100 mg total) by mouth every 4 (four) hours as needed for cough or to loosen phlegm. Patient not taking: Reported on 11/03/2017 12/17/15   Henreitta Leber, MD  megestrol (MEGACE) 400 MG/10ML suspension Take 10 mLs (400 mg total) by mouth 2 (two) times daily. Patient not taking: Reported on 11/03/2017 01/18/16   Laverle Hobby, MD  methocarbamol (ROBAXIN) 500 MG tablet Take 1 tablet (500 mg total) by mouth every 6 (six) hours as needed for muscle spasms. 11/05/17   Salary, Avel Peace, MD  mirtazapine (REMERON SOL-TAB) 30 MG disintegrating tablet Take 30 mg by mouth. Pt takes 1/4th tablet by mouth daily 04/18/17   [provider]  ondansetron (ZOFRAN) 4 MG tablet Take 1 tablet (4 mg total) by mouth every 6 (six) hours as needed for nausea. 11/05/17   Reche Dixon, PA-C  oxyCODONE (OXY IR/ROXICODONE) 5 MG immediate release tablet Take 1-2 tablets (5-10 mg total) by mouth every 4 (four) hours as needed for moderate pain (pain score 4-6). 11/05/17   Reche Dixon, PA-C  oxyCODONE 10 MG TABS Take 1-1.5 tablets (10-15 mg total) by mouth every 4 (four) hours as needed for severe pain (pain score 7-10). 11/05/17   Salary, Holly Bodily D, MD  sodium chloride 1 g tablet Take 2 tablets (2 g total) by mouth 3 (three) times daily with meals. 11/05/17   Salary, Avel Peace, MD  varenicline (CHANTIX CONTINUING MONTH PAK) 1 MG tablet Take 1 tablet (1 mg total) by mouth 2 (two) times daily. Patient not taking: Reported on 11/03/2017 12/29/15   Minna Merritts, MD  VENTOLIN HFA 108 (90 Base) MCG/ACT inhaler INHALE 2 PUFFS INTO LUNGS EVERY 6 HOURS AS NEEDED FOR WHEEZING OR SHORTNESS OF BREATH 10/14/16   Laverle Hobby, MD      VITAL SIGNS:  Blood pressure 103/62, pulse 98, temperature 97.6 F (36.4 C), temperature source Oral, resp. rate 16, SpO2 100  %.  PHYSICAL EXAMINATION:  Physical Exam  GENERAL:  57 y.o.-year-old patient lying in the bed with no acute distress.  Cachectic-appearing. EYES: Pupils equal, round, reactive to light and accommodation. No scleral icterus. Extraocular muscles intact.  HEENT: Head atraumatic, normocephalic. Oropharynx and nasopharynx clear.  Dry mucous membranes. NECK:  Supple, no jugular venous distention. No thyroid enlargement, no tenderness.  LUNGS: Normal breath sounds bilaterally, no wheezing, rales,rhonchi or crepitation. No use of accessory muscles of respiration.  CARDIOVASCULAR: RRR, S1, S2 normal. No murmurs, rubs, or gallops.  ABDOMEN: Soft, nontender, nondistended. Bowel sounds present. No organomegaly or mass.  EXTREMITIES: No pedal edema, cyanosis, or clubbing. + Muscle wasting. NEUROLOGIC: Cranial nerves II through XII are intact. + Global weakness. Sensation intact. Gait not checked.  PSYCHIATRIC: The patient is alert and oriented x 3.  SKIN: No obvious rash, lesion, or ulcer.  LABORATORY PANEL:   CBC Recent Labs  Lab 08/25/18 1159  WBC 2.9*  HGB 8.6*  HCT 24.0*  PLT 208   ------------------------------------------------------------------------------------------------------------------  Chemistries  Recent Labs  Lab 08/25/18 1159  NA 125*  K 3.2*  CL 91*  CO2 24  GLUCOSE 78  BUN <5*  CREATININE 0.32*  CALCIUM 7.6*  MG 1.7   ------------------------------------------------------------------------------------------------------------------  Cardiac Enzymes Recent Labs  Lab 08/25/18 1159  TROPONINI <0.03   ------------------------------------------------------------------------------------------------------------------  RADIOLOGY:  No results found.    IMPRESSION AND PLAN:   Generalized weakness and adult failure to thrive-unable to walk over the last week.  Patient does have a history of lung cancer s/p left upper lobectomy.  Weakness may be related to poor  po intake, hyponatremia, anemia, cancer recurrence.  -IV fluids -Dietitian consult -PT consult -Needs to f/u with PCP for routine outpatient cancer screenings, including pap, colonoscopy, mammogram  Hyponatremia-likely related to hypovolemia, decreased po intake, and beer potomania. -Continue IV fluids -Recheck in the morning  Hypokalemia/hypomagnesemia- K 3.2, mag 1.7 -Replete and recheck  Macrocytic anemia- hemoglobin 8.6, but was 13.1 earlier this year. -Check anemia panel and FOBT -Needs to f/u with oncologist and likely GI on discharge  History of lung cancer s/p left upper lobectomy-recent CT chest 07/2018 without any signs of cancer recurrence.  COPD-stable, no signs of acute exacerbation -Continue home inhalers  Alcohol abuse -CIWA -MVI, thiamine, folate  All the records are reviewed and case discussed with ED provider. Management plans discussed with the patient, family and they are in agreement.  CODE STATUS: Full  TOTAL TIME TAKING CARE OF THIS PATIENT: 45 minutes.    Berna Spare Enslie Sahota M.D on 08/25/2018 at 3:41 PM  Between 7am to 6pm - Pager - [phone]  After 6pm go to [email or web address] - [employer] [facility]  Office  [phone]  CC: Primary care physician; Kirk Ruths, MD   Note: This dictation was prepared with Dragon dictation along with smaller phrase technology. Any transcriptional errors that result from this process are unintentional.

## 2018-08-25 NOTE — ED Triage Notes (Signed)
Patient presents to the ED with weakness and difficulty walking.  Patient fell two days ago on her right hip.  Patient has previous injury to right hip.  Per family patient has not been eating or drinking well for several weeks.  Patient is extremely thin and skin is pale/grey.  Patient is alert and oriented x 4. Patient states she has not been eating due to nausea.  Patient states she is taking nausea pills but they aren't working.

## 2018-08-25 NOTE — ED Notes (Signed)
Patient transported to X-ray 

## 2018-08-25 NOTE — ED Notes (Signed)
Pts family states pt has been increased weakness x few weeks, pt tripped and fell Sunday and has had right hip pain since. Pt states she hasnt eaten or been able to drink in a few days due to nausea.

## 2018-08-25 NOTE — ED Notes (Signed)
Pt looks severely malnourished, pts family states she has recently lost about 20lbs. Pts family states pt normally drinks multiple beers per day and hasnt been able to drink bc of the nausea.

## 2018-08-25 NOTE — Progress Notes (Signed)
PHARMACIST - PHYSICIAN COMMUNICATION  CONCERNING:  Enoxaparin (Lovenox) for DVT Prophylaxis    RECOMMENDATION: Patient was prescribed enoxaprin 40mg  q24 hours for VTE prophylaxis.   Filed Weights   08/25/18 1730  Weight: 68 lb 14.4 oz (31.3 kg)    Body mass index is 14.91 kg/m.  Estimated Creatinine Clearance: 37.9 mL/min (A) (by C-G formula based on SCr of 0.32 mg/dL (L)).   Patient is candidate for enoxaparin 30mg  every 24 hours based on CrCl <67ml/min or Weight less then 45kg for female and 50kg for female  DESCRIPTION: Pharmacy has adjusted enoxaparin dose per [facility] policy.  Patient is now receiving enoxaparin 30mg  every 24 hours.  Dallie Piles, PharmD Clinical Pharmacist  08/25/2018 8:15 PM

## 2018-08-25 NOTE — ED Provider Notes (Signed)
[facility] Emergency Department Provider Note  Time seen: 2:23 PM  I have reviewed the triage vital signs and the nursing notes.   HISTORY  Chief Complaint Weakness and Fall    HPI Lori Rangel is a 57 y.o. female with a past medical history of asthma, lung cancer, COPD, presents to the emergency department for failure to thrive.  According to family who is visiting the patient, patient has been too weak to walk over the last several days, has not been eating or drinking over the past 3 or 4 days due to generalized weakness.  Patient has been nauseated with occasional episodes of vomiting.  She states diarrhea but states that is a chronic issue.  Denies any abdominal pain or chest pain.  Denies any fever cough or congestion.   Past Medical History:  Diagnosis Date  . Asthma   . Cancer ([place])    lung  . COPD (chronic obstructive pulmonary disease) ([place])   . Shortness of breath dyspnea   . Weight decrease major recent weight loss    Patient Active Problem List   Diagnosis Date Noted  . Closed right hip fracture ([place]) 11/03/2017  . Lung mass   . Carotid stenosis 12/31/2015  . PAD (peripheral artery disease) ([place]) 12/31/2015  . Smoker 12/31/2015  . Pre-operative clearance 12/31/2015  . Lung nodule 12/31/2015  . Protein-calorie malnutrition, severe 12/17/2015  . Pneumonia 12/15/2015    Past Surgical History:  Procedure Laterality Date  . BREAST BIOPSY Right   . COLONOSCOPY    . ELECTROMAGNETIC NAVIGATION BROCHOSCOPY N/A 01/02/2016   Procedure: ELECTROMAGNETIC NAVIGATION BRONCHOSCOPY;  Surgeon: Flora Lipps, MD;  Location: [facility] ORS;  Service: Cardiopulmonary;  Laterality: N/A;  . INTRAMEDULLARY (IM) NAIL INTERTROCHANTERIC Right 11/03/2017   Procedure: INTRAMEDULLARY (IM) NAIL INTERTROCHANTRIC;  Surgeon: Leim Fabry, MD;  Location: [facility] ORS;  Service: Orthopedics;  Laterality: Right;    Prior to Admission medications   Medication Sig Start Date End  Date Taking? Authorizing Provider  aspirin EC 81 MG tablet Take 81 mg by mouth daily.    [provider]  budesonide-formoterol (SYMBICORT) 160-4.5 MCG/ACT inhaler Inhale 2 puffs into the lungs 2 (two) times daily. 12/21/15 01/02/16  Laverle Hobby, MD  docusate sodium (COLACE) 100 MG capsule Take 1 capsule (100 mg total) by mouth 2 (two) times daily. 11/05/17   Salary, Avel Peace, MD  enoxaparin (LOVENOX) 40 MG/0.4ML injection Inject 0.4 mLs (40 mg total) into the skin daily. 11/05/17   Reche Dixon, PA-C  fluticasone (FLOVENT HFA) 110 MCG/ACT inhaler Inhale 2 puffs into the lungs 2 (two) times daily. 01/31/17 01/31/18  [provider]  guaiFENesin (ROBITUSSIN) 100 MG/5ML SOLN Take 5 mLs (100 mg total) by mouth every 4 (four) hours as needed for cough or to loosen phlegm. Patient not taking: Reported on 11/03/2017 12/17/15   Henreitta Leber, MD  megestrol (MEGACE) 400 MG/10ML suspension Take 10 mLs (400 mg total) by mouth 2 (two) times daily. Patient not taking: Reported on 11/03/2017 01/18/16   Laverle Hobby, MD  methocarbamol (ROBAXIN) 500 MG tablet Take 1 tablet (500 mg total) by mouth every 6 (six) hours as needed for muscle spasms. 11/05/17   Salary, Avel Peace, MD  mirtazapine (REMERON SOL-TAB) 30 MG disintegrating tablet Take 30 mg by mouth. Pt takes 1/4th tablet by mouth daily 04/18/17   [provider]  ondansetron (ZOFRAN) 4 MG tablet Take 1 tablet (4 mg total) by mouth every 6 (six) hours as needed for  nausea. 11/05/17   Reche Dixon, PA-C  oxyCODONE (OXY IR/ROXICODONE) 5 MG immediate release tablet Take 1-2 tablets (5-10 mg total) by mouth every 4 (four) hours as needed for moderate pain (pain score 4-6). 11/05/17   Reche Dixon, PA-C  oxyCODONE 10 MG TABS Take 1-1.5 tablets (10-15 mg total) by mouth every 4 (four) hours as needed for severe pain (pain score 7-10). 11/05/17   Salary, Holly Bodily D, MD  sodium chloride 1 g tablet Take 2 tablets (2 g total) by mouth 3 (three) times  daily with meals. 11/05/17   Salary, Avel Peace, MD  varenicline (CHANTIX CONTINUING MONTH PAK) 1 MG tablet Take 1 tablet (1 mg total) by mouth 2 (two) times daily. Patient not taking: Reported on 11/03/2017 12/29/15   Minna Merritts, MD  VENTOLIN HFA 108 (90 Base) MCG/ACT inhaler INHALE 2 PUFFS INTO LUNGS EVERY 6 HOURS AS NEEDED FOR WHEEZING OR SHORTNESS OF BREATH 10/14/16   Laverle Hobby, MD    Allergies  Allergen Reactions  . Levaquin [Levofloxacin] Nausea Only    Cramps & Body aches    Family History  Problem Relation Age of Onset  . Cirrhosis Father     Social History Social History   Tobacco Use  . Smoking status: Current Every Day Smoker    Packs/day: 0.75    Years: 40.00    Pack years: 30.00    Types: Cigarettes  . Smokeless tobacco: Never Used  Substance Use Topics  . Alcohol use: Yes    Alcohol/week: 6.0 standard drinks    Types: 6 Cans of beer per week    Comment: 6 beers daily  . Drug use: No    Review of Systems Constitutional: Negative for fever.  Positive for generalized fatigue/weakness, unable to ambulate Cardiovascular: Negative for chest pain. Respiratory: Negative for shortness of breath. Gastrointestinal: Negative for abdominal pain Genitourinary: Negative for urinary compaints Musculoskeletal: Right hip pain, had a right hip surgery in March, fell 2 days ago. Skin: Negative for skin complaints  Neurological: Negative for headache All other ROS negative  ____________________________________________   PHYSICAL EXAM:  VITAL SIGNS: ED Triage Vitals  Enc Vitals Group     BP 08/25/18 1150 (!) 98/59     Pulse Rate 08/25/18 1150 (!) 103     Resp 08/25/18 1150 18     Temp 08/25/18 1150 97.6 F (36.4 C)     Temp Source 08/25/18 1150 Oral     SpO2 08/25/18 1150 99 %     Weight --      Height --      Head Circumference --      Peak Flow --      Pain Score 08/25/18 1354 0     Pain Loc --      Pain Edu? --      Excl. in [place]? --     Constitutional: Alert and oriented.  Cachectic appearing. Eyes: Sunken eyes, otherwise normal. ENT   Head: Normocephalic and atraumatic.   Mouth/Throat: Very dry mucous membranes Cardiovascular: Normal rate, regular rhythm. Respiratory: Normal respiratory effort without tachypnea nor retractions. Breath sounds are clear  Gastrointestinal: Soft and nontender. No distention. Musculoskeletal: Nontender with normal range of motion in all extremities.  Family frail extremities. Neurologic:  Normal speech and language. No gross focal neurologic deficits Skin:  Skin is warm, dry and intact.  Psychiatric: Mood and affect are normal.   ____________________________________________    EKG  EKG viewed and interpreted by myself shows sinus tachycardia  105 bpm with a narrow QRS, mild right axis deviation, largely normal intervals, nonspecific ST changes.  ____________________________________________   INITIAL IMPRESSION / ASSESSMENT AND PLAN / ED COURSE  Pertinent labs & imaging results that were available during my care of the patient were reviewed by me and considered in my medical decision making (see chart for details).  Patient presents to the emergency department for generalized fatigue and weakness unable to ambulate over the last several days, not eating or drinking occasional nausea vomiting.  Patient is cachectic in appearance, appears to have very dry mucous membranes, tachycardic consistent with likely dehydration.  Patient's lab work has resulted showing hyponatremia with a sodium of 125, hypokalemia with a potassium of 3.2, anemic with a hemoglobin 8.6.  The anemia is largely unchanged from 9 months ago, patient has had low sodium in the past but this appears to be significantly lower than her baseline of around 130.  A lot of the patient symptoms are likely due to deconditioning and malnutrition.  However given her hyponatremia with acute increase in weakness over the past 3 to  4 days we will admit to the hospital service continue with IV hydration and potassium replacement.  I have added on a magnesium level as well as a cardiac panel.  We will check a urinalysis and continued IV hydrate in the emergency department.  ____________________________________________   FINAL CLINICAL IMPRESSION(S) / ED DIAGNOSES  Generalized weakness Hyponatremia Hypokalemia   Harvest Dark, MD 08/25/18 1516

## 2018-08-26 DIAGNOSIS — E871 Hypo-osmolality and hyponatremia: Secondary | ICD-10-CM | POA: Diagnosis present

## 2018-08-26 DIAGNOSIS — D539 Nutritional anemia, unspecified: Secondary | ICD-10-CM | POA: Diagnosis present

## 2018-08-26 DIAGNOSIS — F17210 Nicotine dependence, cigarettes, uncomplicated: Secondary | ICD-10-CM | POA: Diagnosis present

## 2018-08-26 DIAGNOSIS — R531 Weakness: Secondary | ICD-10-CM | POA: Diagnosis present

## 2018-08-26 DIAGNOSIS — R627 Adult failure to thrive: Secondary | ICD-10-CM | POA: Diagnosis present

## 2018-08-26 DIAGNOSIS — B9620 Unspecified Escherichia coli [E. coli] as the cause of diseases classified elsewhere: Secondary | ICD-10-CM | POA: Diagnosis present

## 2018-08-26 DIAGNOSIS — E43 Unspecified severe protein-calorie malnutrition: Secondary | ICD-10-CM | POA: Diagnosis present

## 2018-08-26 DIAGNOSIS — Z7951 Long term (current) use of inhaled steroids: Secondary | ICD-10-CM | POA: Diagnosis not present

## 2018-08-26 DIAGNOSIS — Z881 Allergy status to other antibiotic agents status: Secondary | ICD-10-CM | POA: Diagnosis not present

## 2018-08-26 DIAGNOSIS — E039 Hypothyroidism, unspecified: Secondary | ICD-10-CM | POA: Diagnosis present

## 2018-08-26 DIAGNOSIS — Z7982 Long term (current) use of aspirin: Secondary | ICD-10-CM | POA: Diagnosis not present

## 2018-08-26 DIAGNOSIS — Z902 Acquired absence of lung [part of]: Secondary | ICD-10-CM | POA: Diagnosis not present

## 2018-08-26 DIAGNOSIS — D649 Anemia, unspecified: Secondary | ICD-10-CM | POA: Diagnosis not present

## 2018-08-26 DIAGNOSIS — E876 Hypokalemia: Secondary | ICD-10-CM | POA: Diagnosis present

## 2018-08-26 DIAGNOSIS — Z79899 Other long term (current) drug therapy: Secondary | ICD-10-CM | POA: Diagnosis not present

## 2018-08-26 DIAGNOSIS — Z85118 Personal history of other malignant neoplasm of bronchus and lung: Secondary | ICD-10-CM | POA: Diagnosis not present

## 2018-08-26 DIAGNOSIS — F10239 Alcohol dependence with withdrawal, unspecified: Secondary | ICD-10-CM | POA: Diagnosis present

## 2018-08-26 DIAGNOSIS — Z681 Body mass index (BMI) 19 or less, adult: Secondary | ICD-10-CM | POA: Diagnosis not present

## 2018-08-26 DIAGNOSIS — E861 Hypovolemia: Secondary | ICD-10-CM | POA: Diagnosis present

## 2018-08-26 DIAGNOSIS — N390 Urinary tract infection, site not specified: Secondary | ICD-10-CM | POA: Diagnosis present

## 2018-08-26 DIAGNOSIS — Z79891 Long term (current) use of opiate analgesic: Secondary | ICD-10-CM | POA: Diagnosis not present

## 2018-08-26 DIAGNOSIS — R634 Abnormal weight loss: Secondary | ICD-10-CM | POA: Diagnosis not present

## 2018-08-26 DIAGNOSIS — J449 Chronic obstructive pulmonary disease, unspecified: Secondary | ICD-10-CM | POA: Diagnosis present

## 2018-08-26 LAB — BASIC METABOLIC PANEL
ANION GAP: 10 (ref 5–15)
ANION GAP: 7 (ref 5–15)
BUN: <5 mg/dL — ABNORMAL LOW (ref 6–20)
BUN: <5 mg/dL — ABNORMAL LOW (ref 6–20)
CO2: 21 mmol/L — ABNORMAL LOW (ref 22–32)
CO2: 21 mmol/L — ABNORMAL LOW (ref 22–32)
Calcium: 6.9 mg/dL — ABNORMAL LOW (ref 8.9–10.3)
Calcium: 7.2 mg/dL — ABNORMAL LOW (ref 8.9–10.3)
Chloride: 97 mmol/L — ABNORMAL LOW (ref 98–111)
Chloride: 102 mmol/L (ref 98–111)
Creatinine, Ser: <0.3 mg/dL — ABNORMAL LOW (ref 0.44–1.00)
Creatinine, Ser: 0.31 mg/dL — ABNORMAL LOW (ref 0.44–1.00)
GFR calc Af Amer: >60 mL/min (ref >60)
GFR calc non Af Amer: >60 mL/min (ref >60)
Glucose, Bld: 63 mg/dL — ABNORMAL LOW (ref 70–99)
Glucose, Bld: 78 mg/dL (ref 70–99)
Potassium: 3.8 mmol/L (ref 3.5–5.1)
Potassium: 4 mmol/L (ref 3.5–5.1)
Sodium: 128 mmol/L — ABNORMAL LOW (ref 135–145)
Sodium: 130 mmol/L — ABNORMAL LOW (ref 135–145)

## 2018-08-26 LAB — IRON AND TIBC: Iron: 67 ug/dL (ref 28–170)

## 2018-08-26 LAB — HEMOGLOBIN AND HEMATOCRIT, BLOOD
HCT: 29.8 % — ABNORMAL LOW (ref 36.0–46.0)
Hemoglobin: 10.4 g/dL — ABNORMAL LOW (ref 12.0–15.0)

## 2018-08-26 LAB — RETICULOCYTES
Immature Retic Fract: 15.3 % (ref 2.3–15.9)
RBC.: 1.85 MIL/uL — ABNORMAL LOW (ref 3.87–5.11)
Retic Count, Absolute: 45.9 10*3/uL (ref 19.0–186.0)
Retic Ct Pct: 2.5 % (ref 0.4–3.1)

## 2018-08-26 LAB — CBC
HCT: 18.8 % — ABNORMAL LOW (ref 36.0–46.0)
Hemoglobin: 6.6 g/dL — ABNORMAL LOW (ref 12.0–15.0)
MCH: 35.7 pg — ABNORMAL HIGH (ref 26.0–34.0)
MCHC: 35.1 g/dL (ref 30.0–36.0)
MCV: 101.6 fL — ABNORMAL HIGH (ref 80.0–100.0)
Platelets: 176 10*3/uL (ref 150–400)
RBC: 1.85 MIL/uL — AB (ref 3.87–5.11)
RDW: 14.4 % (ref 11.5–15.5)
WBC: 2.4 10*3/uL — ABNORMAL LOW (ref 4.0–10.5)
nRBC: 0 % (ref 0.0–0.2)

## 2018-08-26 LAB — FOLATE: Folate: 13.1 ng/mL (ref >5.9)

## 2018-08-26 LAB — FERRITIN: Ferritin: 291 ng/mL (ref 11–307)

## 2018-08-26 LAB — TSH
TSH: 7.412 u[IU]/mL — ABNORMAL HIGH (ref 0.350–4.500)
TSH: 8.111 u[IU]/mL — ABNORMAL HIGH (ref 0.350–4.500)

## 2018-08-26 LAB — VITAMIN B12
Vitamin B-12: 483 pg/mL (ref 180–914)
Vitamin B-12: 466 pg/mL (ref 180–914)

## 2018-08-26 LAB — MAGNESIUM: Magnesium: 2.1 mg/dL (ref 1.7–2.4)

## 2018-08-26 MED ORDER — POTASSIUM CHLORIDE CRYS ER 20 MEQ PO TBCR
20.0000 meq | EXTENDED_RELEASE_TABLET | Freq: Two times a day (BID) | ORAL | Status: DC
  Filled 2018-08-26: qty 1

## 2018-08-26 MED ORDER — SODIUM CHLORIDE 0.9 % IV SOLN
INTRAVENOUS | Status: AC
  Administered 2018-08-26 – 2018-08-27 (×2): via INTRAVENOUS

## 2018-08-26 MED ORDER — POTASSIUM CHLORIDE 20 MEQ PO PACK
20.0000 meq | PACK | Freq: Two times a day (BID) | ORAL | Status: DC
  Administered 2018-08-26 – 2018-08-29 (×6): 20 meq via ORAL
  Filled 2018-08-26 (×6): qty 1

## 2018-08-26 MED ORDER — ZOLPIDEM TARTRATE 5 MG PO TABS
5.0000 mg | ORAL_TABLET | Freq: Every evening | ORAL | Status: DC | PRN
  Filled 2018-08-26: qty 1

## 2018-08-26 MED ORDER — SODIUM CHLORIDE 0.9% IV SOLUTION
Freq: Once | INTRAVENOUS | Status: AC
  Administered 2018-08-26: 13:00:00 via INTRAVENOUS

## 2018-08-26 MED ORDER — SODIUM CHLORIDE 0.9 % IV SOLN
1.0000 g | INTRAVENOUS | Status: DC
  Administered 2018-08-26 – 2018-08-28 (×3): 1 g via INTRAVENOUS
  Filled 2018-08-26: qty 1
  Filled 2018-08-26: qty 10
  Filled 2018-08-26 (×2): qty 1

## 2018-08-26 MED ORDER — SODIUM CHLORIDE 0.9 % IV BOLUS
500.0000 mL | Freq: Once | INTRAVENOUS | Status: AC
  Administered 2018-08-26: 500 mL via INTRAVENOUS

## 2018-08-26 MED ORDER — NICOTINE 21 MG/24HR TD PT24
21.0000 mg | MEDICATED_PATCH | Freq: Every day | TRANSDERMAL | Status: DC
  Administered 2018-08-26 – 2018-08-29 (×4): 21 mg via TRANSDERMAL
  Filled 2018-08-26 (×4): qty 1

## 2018-08-26 NOTE — Progress Notes (Signed)
[place] [facility] [place] NAME: Lori Rangel    MR#:  [id number]  DATE OF BIRTH:  Nov 03, 1960  SUBJECTIVE:  CHIEF COMPLAINT:   Chief Complaint  Patient presents with  . Weakness  . Fall   Came with generalized weakness, malnutrition, altered mental status for last few days. She has history of lung cancer and status post surgery 9 months ago and continue to lose weight since then.  She is a chronic alcohol drinker and does not eat much food. REVIEW OF SYSTEMS:  CONSTITUTIONAL: No fever, have fatigue or weakness.  EYES: No blurred or double vision.  EARS, NOSE, AND THROAT: No tinnitus or ear pain.  RESPIRATORY: No cough, shortness of breath, wheezing or hemoptysis.  CARDIOVASCULAR: No chest pain, orthopnea, edema.  GASTROINTESTINAL: No nausea, vomiting, diarrhea or abdominal pain.  GENITOURINARY: No dysuria, hematuria.  ENDOCRINE: No polyuria, nocturia,  HEMATOLOGY: No anemia, easy bruising or bleeding SKIN: No rash or lesion. MUSCULOSKELETAL: No joint pain or arthritis.   NEUROLOGIC: No tingling, numbness, weakness.  PSYCHIATRY: No anxiety or depression.   ROS  DRUG ALLERGIES:   Allergies  Allergen Reactions  . Levaquin [Levofloxacin] Nausea Only    Cramps & Body aches    VITALS:  Blood pressure 100/69, pulse 86, temperature (!) 97.5 F (36.4 C), temperature source Oral, resp. rate 18, height 4\' 9"  (1.448 m), weight 31.3 kg, SpO2 91 %.  PHYSICAL EXAMINATION:  GENERAL:  57 y.o.-year-old very thin patient lying in the bed with no acute distress.  EYES: Pupils equal, round, reactive to light and accommodation. No scleral icterus. Extraocular muscles intact.  HEENT: Head atraumatic, normocephalic. Oropharynx and nasopharynx clear.  NECK:  Supple, no jugular venous distention. No thyroid enlargement, no tenderness.  LUNGS: Normal breath sounds bilaterally, no wheezing, rales,rhonchi or crepitation. No use of accessory muscles of  respiration.  CARDIOVASCULAR: S1, S2 normal. No murmurs, rubs, or gallops.  ABDOMEN: Soft, nontender, nondistended. Bowel sounds present. No organomegaly or mass.  EXTREMITIES: No pedal edema, cyanosis, or clubbing.  Severe muscle wasting and thin extremities. NEUROLOGIC: Cranial nerves II through XII are intact. Muscle strength 4-5/5 in all extremities. Sensation intact. Gait not checked.  PSYCHIATRIC: The patient is alert and oriented x 3.  SKIN: No obvious rash, lesion, or ulcer.   Physical Exam LABORATORY PANEL:   CBC Recent Labs  Lab 08/26/18 0354  WBC 2.4*  HGB 6.6*  HCT 18.8*  PLT 176   ------------------------------------------------------------------------------------------------------------------  Chemistries  Recent Labs  Lab 08/26/18 0354 08/26/18 1233  NA 128* 130*  K 3.8 4.0  CL 97* 102  CO2 21* 21*  GLUCOSE 63* 78  BUN <5* <5*  CREATININE <0.30* 0.31*  CALCIUM 6.9* 7.2*  MG 2.1  --    ------------------------------------------------------------------------------------------------------------------  Cardiac Enzymes Recent Labs  Lab 08/25/18 1159  TROPONINI <0.03   ------------------------------------------------------------------------------------------------------------------  RADIOLOGY:  Dg Hip Unilat With Pelvis 2-3 Views Right  Result Date: 08/25/2018 CLINICAL DATA:  Fall, RIGHT hip pain EXAM: DG HIP (WITH OR WITHOUT PELVIS) 2-3V RIGHT COMPARISON:  11/03/2017 FINDINGS: Intramedullary nail fixation of the RIGHT hip. No evidence acute fracture dislocation. The distal RIGHT femur is imaged and demonstrates no acute findings. IMPRESSION: No RIGHT femur fracture or dislocation Electronically Signed   By: Suzy Bouchard M.D.   On: 08/25/2018 16:19    ASSESSMENT AND PLAN:   Active Problems:   Hyponatremia  Generalized weakness and adult failure to thrive-unable to walk over the last week.  Patient does have a history of lung cancer s/p left  upper lobectomy.  Weakness may be related to poor po intake, hyponatremia, anemia, cancer recurrence.  -IV fluids -Dietitian consult -PT consult -Needs to f/u with PCP for routine outpatient cancer screenings, including pap, colonoscopy, mammogram  Hyponatremia-likely related to hypovolemia, decreased po intake, and beer potomania. -Continue IV fluids -Recheck in the morning  Hypokalemia/hypomagnesemia- K 3.2, mag 1.7 -Replete and recheck  Macrocytic anemia- hemoglobin 8.6, but was 13.1 earlier this year. -Check anemia panel and FOBT -Globin dropped again, transfuse 1 unit for now. -Due to history of weight loss, anemia, chronic alcoholism, I will call GI consult as inpatient.  Plan for EGD likely tomorrow.  History of lung cancer s/p left upper lobectomy-recent CT chest 07/2018 without any signs of cancer recurrence. She follows at [facility].  COPD-stable, no signs of acute exacerbation -Continue home inhalers  Alcohol abuse -CIWA -MVI, thiamine, folate  Severe malnutrition Dietary consult to help with supplements.  All the records are reviewed and case discussed with Care Management/Social Workerr. Management plans discussed with the patient, family and they are in agreement.  CODE STATUS: Full.  TOTAL TIME TAKING CARE OF THIS PATIENT: 35 minutes.     POSSIBLE D/C IN 2-3 DAYS, DEPENDING ON CLINICAL CONDITION.   Vaughan Basta M.D on 08/26/2018   Between 7am to 6pm - Pager - [phone]  After 6pm go to [email or web address] - password [email or web address] [place] [facility]  Office  [phone]  CC: Primary care physician; Kirk Ruths, MD  Note: This dictation was prepared with Dragon dictation along with smaller phrase technology. Any transcriptional errors that result from this process are unintentional.

## 2018-08-26 NOTE — Care Management Note (Signed)
Case Management Note  Patient Details  Name: Lori Rangel MRN: [id number] Date of Birth: [birth year]  Subjective/Objective:                  RNCM met with patient and she appeared to be cursing at someone in the room however the only other person in the room with her was this RNCM.  Message left for both daughters Daine Floras [phone] (stated HCPOA) and Tye [place] [phone]).  Tye [place] called this RNCM back stating concern and not understanding what was wrong with her mother as "this is not how she is at home".  She states she forgets things but "never hallucinates".  She lives alone and has been falling a lot at home.  She has partial lobectomy of lung in May of this year per cathy and recently had hip surgery (post fall).  "She drinks beer daily".  [facility] in the past and that is the agency they would like to use again.  Her PCP is Dr. Ouida Sills and per Tye Maryland she "doesn't feel that he is doing any good just treated her nausea".  Hyponatremia explained to [facility] that is sometimes is related to ETOH use and may never correct itself.  She states "they can't get her to eat anything either".  I mentioned  Palliative/hospice consult and she said "whoa" as if she was not expecting that.  She wants to treat the treatable. She states she can't walk right now due to "falling on the hip she just had repaired".  PT evaluation pending.  She went to [facility]'s home [facility] last discharge.  Action/Plan:   Referral to [facility] with [facility].    Expected Discharge Date:                  Expected Discharge Plan:     In-House Referral:     Discharge planning Services  CM Consult  Post Acute Care Choice:  Home Health Choice offered to:  Adult Children  DME Arranged:    DME Agency:     HH Arranged:  PT, Nurse's Aide, RN, Social Work [employer] Agency:  [facility]  Status of Service:  In process, will continue to follow  If discussed at Long Length of  Stay Meetings, dates discussed:    Additional Comments:  Marshell Garfinkel, RN 08/26/2018, 9:12 AM

## 2018-08-26 NOTE — Care Management Obs Status (Signed)
[place] NOTIFICATION   Patient Details  Name: Lori Rangel MRN: [id number] Date of Birth: Jul 23, 1961   Medicare Observation Status Notification Given:  Yes    Marshell Garfinkel, RN 08/26/2018, 7:43 AM

## 2018-08-27 ENCOUNTER — Inpatient Hospital Stay: Payer: Medicare Other

## 2018-08-27 ENCOUNTER — Inpatient Hospital Stay: Payer: Medicare Other | Admitting: Anesthesiology

## 2018-08-27 ENCOUNTER — Encounter
Admission: EM | Disposition: A | Discharge status: Skilled Nursing Facility | Payer: Self-pay | Source: Home / Self Care | Attending: Internal Medicine

## 2018-08-27 ENCOUNTER — Encounter: Payer: Self-pay | Admitting: Anesthesiology

## 2018-08-27 DIAGNOSIS — D649 Anemia, unspecified: Secondary | ICD-10-CM

## 2018-08-27 HISTORY — PX: ESOPHAGOGASTRODUODENOSCOPY (EGD) WITH PROPOFOL: SHX5813

## 2018-08-27 LAB — CBC
HCT: 28.7 % — ABNORMAL LOW (ref 36.0–46.0)
Hemoglobin: 10 g/dL — ABNORMAL LOW (ref 12.0–15.0)
MCH: 34.7 pg — AB (ref 26.0–34.0)
MCHC: 34.8 g/dL (ref 30.0–36.0)
MCV: 99.7 fL (ref 80.0–100.0)
Platelets: 163 10*3/uL (ref 150–400)
RBC: 2.88 MIL/uL — ABNORMAL LOW (ref 3.87–5.11)
RDW: 15 % (ref 11.5–15.5)
WBC: 2.9 10*3/uL — ABNORMAL LOW (ref 4.0–10.5)
nRBC: 0 % (ref 0.0–0.2)

## 2018-08-27 LAB — HEPATIC FUNCTION PANEL
ALT: 21 U/L (ref 0–44)
AST: 28 U/L (ref 15–41)
Albumin: 1.5 g/dL — ABNORMAL LOW (ref 3.5–5.0)
Alkaline Phosphatase: 113 U/L (ref 38–126)
BILIRUBIN INDIRECT: 0.5 mg/dL (ref 0.3–0.9)
Bilirubin, Direct: 0.2 mg/dL (ref 0.0–0.2)
Total Bilirubin: 0.7 mg/dL (ref 0.3–1.2)
Total Protein: 4.5 g/dL — ABNORMAL LOW (ref 6.5–8.1)

## 2018-08-27 LAB — BPAM RBC
Blood Product Expiration Date: 202001172359
ISSUE DATE / TIME: 201912251304
UNIT TYPE AND RH: 5100

## 2018-08-27 LAB — BASIC METABOLIC PANEL
Anion gap: 5 (ref 5–15)
CO2: 24 mmol/L (ref 22–32)
Calcium: 7.4 mg/dL — ABNORMAL LOW (ref 8.9–10.3)
Chloride: 102 mmol/L (ref 98–111)
Creatinine, Ser: <0.3 mg/dL — ABNORMAL LOW (ref 0.44–1.00)
Glucose, Bld: 80 mg/dL (ref 70–99)
Potassium: 3.5 mmol/L (ref 3.5–5.1)
SODIUM: 131 mmol/L — AB (ref 135–145)

## 2018-08-27 LAB — TYPE AND SCREEN
ABO/RH(D): O POS
Antibody Screen: NEGATIVE
Unit division: 0

## 2018-08-27 LAB — PROTIME-INR
INR: 1.05
Prothrombin Time: 13.6 seconds (ref 11.4–15.2)

## 2018-08-27 LAB — PREPARE RBC (CROSSMATCH)

## 2018-08-27 LAB — MAGNESIUM: Magnesium: 2 mg/dL (ref 1.7–2.4)

## 2018-08-27 LAB — T4, FREE: Free T4: 0.53 ng/dL — ABNORMAL LOW (ref 0.82–1.77)

## 2018-08-27 SURGERY — ESOPHAGOGASTRODUODENOSCOPY (EGD) WITH PROPOFOL
Anesthesia: General

## 2018-08-27 MED ORDER — PROPOFOL 500 MG/50ML IV EMUL
INTRAVENOUS | Status: AC
  Filled 2018-08-27: qty 50

## 2018-08-27 MED ORDER — LEVOTHYROXINE SODIUM 25 MCG PO TABS
25.0000 ug | ORAL_TABLET | Freq: Every day | ORAL | Status: DC
  Administered 2018-08-28 – 2018-08-29 (×2): 25 ug via ORAL
  Filled 2018-08-27 (×2): qty 1

## 2018-08-27 MED ORDER — LIDOCAINE HCL (PF) 2 % IJ SOLN
INTRAMUSCULAR | Status: AC
  Filled 2018-08-27: qty 10

## 2018-08-27 MED ORDER — VITAMIN C 500 MG PO TABS
250.0000 mg | ORAL_TABLET | Freq: Two times a day (BID) | ORAL | Status: DC
  Administered 2018-08-27 – 2018-08-29 (×4): 250 mg via ORAL
  Filled 2018-08-27 (×4): qty 1

## 2018-08-27 MED ORDER — PREMIER PROTEIN SHAKE
11.0000 [oz_av] | Freq: Two times a day (BID) | ORAL | Status: DC
  Administered 2018-08-28 – 2018-08-29 (×3): 11 [oz_av] via ORAL

## 2018-08-27 NOTE — Progress Notes (Signed)
Initial Nutrition Assessment  DOCUMENTATION CODES:   Severe malnutrition in context of chronic illness, Underweight  INTERVENTION:  Continue protein shake made by RN (Premier Protein + ice cream) BID. Each bottle of Premier Protein provides 160 kcal and 30 grams of protein.  Provide Magic cup TID with meals, each supplement provides 290 kcal and 9 grams of protein.  Continue MVI daily, thiamine 737 mg daily, folic acid daily.  Also provide vitamin C 250 mg BID.  Monitor magnesium, potassium, and phosphorus daily for at least 3 days, MD to replete as needed, as pt is at risk for refeeding syndrome given severe malnutrition, EtOH abuse.  NUTRITION DIAGNOSIS:   Severe Malnutrition related to chronic illness(COPD, lung cancer, EtOH abuse) as evidenced by severe fat depletion, severe muscle depletion.  GOAL:   Patient will meet greater than or equal to 90% of their needs  MONITOR:   PO intake, Supplement acceptance, Labs, Weight trends, I & O's  REASON FOR ASSESSMENT:   Malnutrition Screening Tool, Consult Assessment of nutrition requirement/status  ASSESSMENT:   57 year old female with PMHx of asthma, COPD, EtOH abuse, lung cancer s/p left upper lobectomy who is admitted with generalized weakness, adult FTT, hyponatremia.   Met with patient and her family at bedside. Family provides most of history. They report she has a very poor appetite and intake at home. Most of her intake is alcohol and she eats very little. They were trying to get her to eat some mashed potatoes and gravy at time of RD assessment. She is unable to tolerate Ensure. She does like the protein shakes the RN is making for her. She also enjoys ice cream so they report she may enjoy Holiday representative.  They are unsure of exact weight trend. They report she is weighing in the 60s but according to weight this AM she was 37.3 kg (82.2 lbs), which is still underweight and concerning. Weight in chart appears stable  since 2017, so she has likely been this underweight for a while now.  Medications reviewed and include: Colace 106 mg BID, folic acid 1 mg daily, MVI daily, potassium chloride 20 mEq BID, thiamine 100 mg daily, NS @ 75 mL/hr, ceftriaxone.  Labs reviewed: Sodium 131, BUN <5, Creatinine <0.3.  Discussed with RN. Protein shakes are being made with Premier Protein.  NUTRITION - FOCUSED PHYSICAL EXAM:    Most Recent Value  Orbital Region  Severe depletion  Upper Arm Region  Severe depletion  Thoracic and Lumbar Region  Severe depletion  Buccal Region  Severe depletion  Temple Region  Severe depletion  Clavicle Bone Region  Severe depletion  Clavicle and Acromion Bone Region  Severe depletion  Scapular Bone Region  Severe depletion  Dorsal Hand  Severe depletion  Patellar Region  Severe depletion  Anterior Thigh Region  Severe depletion  Posterior Calf Region  Severe depletion  Edema (RD Assessment)  None  Hair  Reviewed  Eyes  Reviewed  Mouth  Reviewed  Skin  Reviewed  Nails  Reviewed     Diet Order:   Diet Order            Diet regular Room service appropriate? Yes; Fluid consistency: Thin  Diet effective now             EDUCATION NEEDS:   Not appropriate for education at this time  Skin:  Skin Assessment: Reviewed RN Assessment(ecchymosis bilateral arms)  Last BM:  08/26/2018 - smear type 4  Height:  Ht Readings from Last 1 Encounters:  08/27/18 _0  (1.448 m)   Weight:   Wt Readings from Last 1 Encounters:  08/27/18 37.3 kg   Ideal Body Weight:  43.2 kg  BMI:  Body mass index is 17.79 kg/m.  Estimated Nutritional Needs:   Kcal:  1200-1400  Protein:  60-70 grams  Fluid:  1.2-1.4 L/day  Willey Blade, MS, RD, LDN Office: [phone] Pager: [phone] After Hours/Weekend Pager: [phone]

## 2018-08-27 NOTE — Evaluation (Signed)
Physical Therapy Evaluation Patient Details Name: Lori Rangel MRN: [id number] DOB: Jun 19, 1961 Today's Date: 08/27/2018   History of Present Illness  Pt is a 57 y.o. female presenting to hospital 08/26/18 with weakness, fall, FTT, and unable to walk.  Pt admitted with hyponatremia, hypokalemia/hypomagnesemia, and macrocytic anemia.  S/p 1 unit PRBC transfusion.  EGD cancelled 08/27/18 d/t confusion.  PMH includes R IMN 10/2017, asthma, lung CA, COPD, h/o L upper lobectomy 12/2017.  Clinical Impression  Prior to hospital admission, pt was modified independent ambulating with SPC.  Pt lives alone in 1 level home with steps to enter.  Currently pt is mod assist semi-supine to sit and CGA sit to supine; pt refused to stand and layed back down in bed.  Pt impulsive and oriented to self only.  Difficult to encourage pt to participate.  Pt would benefit from skilled PT to address noted impairments and functional limitations (see below for any additional details).  Upon hospital discharge, recommend pt discharge to STR pending pt's progress and ability to participate.    Follow Up Recommendations SNF    Equipment Recommendations  Rolling walker with 5" wheels(youth sized)    Recommendations for Other Services       Precautions / Restrictions Precautions Precautions: Fall Restrictions Weight Bearing Restrictions: No      Mobility  Bed Mobility Overal bed mobility: Needs Assistance Bed Mobility: Supine to Sit;Sit to Supine     Supine to sit: Mod assist;HOB elevated Sit to supine: Min guard;HOB elevated   General bed mobility comments: assist for trunk semi-supine to sit; CGA for safety sit to supine; 2 assist to boost pt up in bed  Transfers                 General transfer comment: pt refused to stand and layed back down in bed so unable to assess  Ambulation/Gait             General Gait Details: pt refused to stand and layed back down in bed so unable to  assess  Stairs            Wheelchair Mobility    Modified Rankin (Stroke Patients Only)       Balance Overall balance assessment: Needs assistance Sitting-balance support: No upper extremity supported;Feet unsupported Sitting balance-Leahy Scale: Good Sitting balance - Comments: steady sitting reaching within BOS       Standing balance comment: unable to assess                             Pertinent Vitals/Pain Pain Assessment: No/denies pain  Vitals (HR and O2 on room air) stable and WFL throughout treatment session.    Home Living Family/patient expects to be discharged to:: Private residence Living Arrangements: Alone Available Help at Discharge: Family Type of Home: Mobile home Home Access: Stairs to enter Entrance Stairs-Rails: Left;Right;Can reach both Entrance Stairs-Number of Steps: 3 1/2 steps to enter Home Layout: One level [place] - single point;Walker - 2 wheels      Prior Function Level of Independence: Independent with assistive device(s)         Comments: Ambulating with SPC.  Recent falls.     Hand Dominance        Extremity/Trunk Assessment   Upper Extremity Assessment Upper Extremity Assessment: Generalized weakness    Lower Extremity Assessment Lower Extremity Assessment: Generalized weakness    Cervical / Trunk Assessment Cervical / Trunk  Assessment: Normal  Communication   Communication: No difficulties  Cognition Arousal/Alertness: Awake/alert Behavior During Therapy: Impulsive Overall Cognitive Status: Impaired/Different from baseline Area of Impairment: Orientation                 Orientation Level: Place;Time;Situation             General Comments: Oriented to name and DOB only.  Reported she was at movies to see the Wizard of Oz.      General Comments   Nursing cleared pt for participation in physical therapy.  Pt agreeable to limited PT session with encouragement.  Pt's  daughter's and sisters present.    Exercises     Assessment/Plan    PT Assessment Patient needs continued PT services  PT Problem List Decreased strength;Decreased activity tolerance;Decreased balance;Decreased mobility;Decreased cognition;Decreased knowledge of use of DME;Decreased safety awareness;Decreased knowledge of precautions       PT Treatment Interventions DME instruction;Gait training;Stair training;Functional mobility training;Therapeutic activities;Therapeutic exercise;Balance training;Patient/family education    PT Goals (Current goals can be found in the Care Plan section)  Acute Rehab PT Goals Patient Stated Goal: to improve strength PT Goal Formulation: With patient/family Time For Goal Achievement: 09/10/18 Potential to Achieve Goals: Fair    Frequency Min 2X/week   Barriers to discharge Decreased caregiver support      Co-evaluation               AM-PAC PT "6 Clicks" Mobility  Outcome Measure Help needed turning from your back to your side while in a flat bed without using bedrails?: A Little Help needed moving from lying on your back to sitting on the side of a flat bed without using bedrails?: A Lot Help needed moving to and from a bed to a chair (including a wheelchair)?: Total Help needed standing up from a chair using your arms (e.g., wheelchair or bedside chair)?: Total Help needed to walk in hospital room?: Total Help needed climbing 3-5 steps with a railing? : Total 6 Click Score: 9    End of Session   Activity Tolerance: Patient tolerated treatment well Patient left: in bed;with call bell/phone within reach;with family/visitor present;Other (comment)(Family reports bed alarm not needed when they are in room and would call nurse to place on when they left (nurse verified this was correct)) Nurse Communication: Mobility status;Precautions PT Visit Diagnosis: Other abnormalities of gait and mobility (R26.89);Muscle weakness (generalized)  (M62.81);Difficulty in walking, not elsewhere classified (R26.2)    Time: 1552-0802 PT Time Calculation (min) (ACUTE ONLY): 19 min   Charges:   PT Evaluation $PT Eval Low Complexity: 1 Low         [place], PT 08/27/18, 4:02 PM [phone]

## 2018-08-27 NOTE — Progress Notes (Signed)
Seen and assessed by anesthesia, Very confused and felt not safe to perform anesthesia at this time especially when there is no severe bleeding noted.   Plan  1. Return to floor 2. Once confusion clears then will consider EGD  Dr [name] MD,MRCP [facility]) Gastroenterology/Hepatology Pager: [phone]

## 2018-08-27 NOTE — H&P (Signed)
Lori Rangel , MD [address], [place], [place], [place], [address] [address] [place], [place], [place], [address] Phone: [phone]  Fax: [phone]  Consultation  Referring Provider: Dr Brett Albino Primary Care Physician:  Lori Ruths, MD Primary Gastroenterologist:  None         Reason for Consultation:     Anemia  Date of Admission:  08/25/2018 Date of Consultation:  08/27/2018         HPI:   Lori Rangel is a 57 y.o. female with a past medical history of lung cancer, status post left upper lobectomy, COPD, excess alcohol consumption.  Was admitted on 08/25/2018 for generalized weakness.  She is apparently lost a lot of weight.  I spoke to her doctors and she says that she drinks a lot of alcohol and does not eat much.  She lives by herself.  She is normally not confused but has been confused for over a day.  In the emergency room she was found to have a hemoglobin of 8.6 g.  The daughter denies that she has seen any overt bleeding in terms of hemoptysis/hematemesis, rectal bleeding.  BUN/creatinine ratio not elevated on admission.  Past Medical History:  Diagnosis Date  . Asthma   . Cancer ([place])    lung  . COPD (chronic obstructive pulmonary disease) ([place])   . Shortness of breath dyspnea   . Weight decrease major recent weight loss    Past Surgical History:  Procedure Laterality Date  . BREAST BIOPSY Right   . COLONOSCOPY    . ELECTROMAGNETIC NAVIGATION BROCHOSCOPY N/A 01/02/2016   Procedure: ELECTROMAGNETIC NAVIGATION BRONCHOSCOPY;  Surgeon: Flora Lipps, MD;  Location: [facility] ORS;  Service: Cardiopulmonary;  Laterality: N/A;  . INTRAMEDULLARY (IM) NAIL INTERTROCHANTERIC Right 11/03/2017   Procedure: INTRAMEDULLARY (IM) NAIL INTERTROCHANTRIC;  Surgeon: Leim Fabry, MD;  Location: [facility] ORS;  Service: Orthopedics;  Laterality: Right;    Prior to Admission medications   Medication Sig Start Date End Date Taking? Authorizing Provider  acetaminophen (TYLENOL)  500 MG tablet Take 500-1,000 mg by mouth every 8 (eight) hours as needed for mild constipation or moderate pain.   Yes [provider]  aspirin EC 81 MG tablet Take 81 mg by mouth daily.   Yes [provider]  mirtazapine (REMERON) 15 MG tablet Take 15 mg by mouth daily.    Yes [provider]  VENTOLIN HFA 108 (90 Base) MCG/ACT inhaler INHALE 2 PUFFS INTO LUNGS EVERY 6 HOURS AS NEEDED FOR WHEEZING OR SHORTNESS OF BREATH Patient taking differently: Inhale 2 puffs into the lungs every 6 (six) hours as needed for wheezing or shortness of breath.  10/14/16  Yes Laverle Hobby, MD  budesonide-formoterol (SYMBICORT) 160-4.5 MCG/ACT inhaler Inhale 2 puffs into the lungs 2 (two) times daily. 12/21/15 01/02/16  Laverle Hobby, MD  docusate sodium (COLACE) 100 MG capsule Take 1 capsule (100 mg total) by mouth 2 (two) times daily. 11/05/17   Salary, Avel Peace, MD  enoxaparin (LOVENOX) 40 MG/0.4ML injection Inject 0.4 mLs (40 mg total) into the skin daily. 11/05/17   Reche Dixon, PA-C  guaiFENesin (ROBITUSSIN) 100 MG/5ML SOLN Take 5 mLs (100 mg total) by mouth every 4 (four) hours as needed for cough or to loosen phlegm. Patient not taking: Reported on 11/03/2017 12/17/15   Henreitta Leber, MD  megestrol (MEGACE) 400 MG/10ML suspension Take 10 mLs (400 mg total) by mouth 2 (two) times daily. Patient not taking: Reported on 11/03/2017 01/18/16  Laverle Hobby, MD  methocarbamol (ROBAXIN) 500 MG tablet Take 1 tablet (500 mg total) by mouth every 6 (six) hours as needed for muscle spasms. 11/05/17   Salary, Avel Peace, MD  ondansetron (ZOFRAN) 4 MG tablet Take 1 tablet (4 mg total) by mouth every 6 (six) hours as needed for nausea. 11/05/17   Reche Dixon, PA-C  oxyCODONE (OXY IR/ROXICODONE) 5 MG immediate release tablet Take 1-2 tablets (5-10 mg total) by mouth every 4 (four) hours as needed for moderate pain (pain score 4-6). 11/05/17   Reche Dixon, PA-C  oxyCODONE 10 MG TABS Take  1-1.5 tablets (10-15 mg total) by mouth every 4 (four) hours as needed for severe pain (pain score 7-10). 11/05/17   Salary, Holly Bodily D, MD  sodium chloride 1 g tablet Take 2 tablets (2 g total) by mouth 3 (three) times daily with meals. 11/05/17   Salary, Avel Peace, MD  varenicline (CHANTIX CONTINUING MONTH PAK) 1 MG tablet Take 1 tablet (1 mg total) by mouth 2 (two) times daily. Patient not taking: Reported on 11/03/2017 12/29/15   Minna Merritts, MD    Family History  Problem Relation Age of Onset  . Cirrhosis Father      Social History   Tobacco Use  . Smoking status: Current Every Day Smoker    Packs/day: 0.75    Years: 40.00    Pack years: 30.00    Types: Cigarettes  . Smokeless tobacco: Never Used  Substance Use Topics  . Alcohol use: Yes    Alcohol/week: 6.0 standard drinks    Types: 6 Cans of beer per week    Comment: 6 beers daily  . Drug use: No    Allergies as of 08/25/2018 - Review Complete 08/25/2018  Allergen Reaction Noted  . Levaquin [levofloxacin] Nausea Only 12/21/2015    Review of Systems:    Not able to assess due to confusion.   Physical Exam:  Vital signs in last 24 hours: Temp:  [97.3 F (36.3 C)-98 F (36.7 C)] 97.6 F (36.4 C) (12/26 0517) Pulse Rate:  [82-99] 99 (12/26 0517) Resp:  [18-20] 20 (12/26 0517) BP: (85-116)/(60-77) 116/77 (12/26 0517) SpO2:  [91 %-100 %] 100 % (12/26 0517) Weight:  [37.3 kg] 37.3 kg (12/26 0656) Last BM Date: (pt does not remember) General: Appears comfortable but confused.   very thin and cachectic. Head:  Normocephalic and atraumatic. Eyes:   No icterus.   Conjunctiva pink. PERRLA. Ears: Unable to assess. Neck:  Supple; no masses or thyroidomegaly Lungs: Respirations even and unlabored. Lungs clear to auscultation bilaterally.   No wheezes, crackles, or rhonchi.  Heart:  Regular rate and rhythm;  Without murmur, clicks, rubs or gallops Abdomen:  Soft, nondistended, nontender. Normal bowel sounds. No appreciable  masses or hepatomegaly.  No rebound or guarding.  Neurologic:  Alert and oriented x0; going all 4 limbs. Skin:  Intact without significant lesions or rashes. Cervical Nodes:  No significant cervical adenopathy. Psych: Awake but confused.  LAB RESULTS: Recent Labs    08/25/18 1159 08/26/18 0354 08/26/18 2045 08/27/18 0507  WBC 2.9* 2.4*  --  2.9*  HGB 8.6* 6.6* 10.4* 10.0*  HCT 24.0* 18.8* 29.8* 28.7*  PLT 208 176  --  163   BMET Recent Labs    08/26/18 0354 08/26/18 1233 08/27/18 0507  NA 128* 130* 131*  K 3.8 4.0 3.5  CL 97* 102 102  CO2 21* 21* 24  GLUCOSE 63* 78 80  BUN <5* <5* <5*  CREATININE <0.30* 0.31* <0.30*  CALCIUM 6.9* 7.2* 7.4*   LFT No results for input(s): PROT, ALBUMIN, AST, ALT, ALKPHOS, BILITOT, BILIDIR, IBILI in the last 72 hours. PT/INR No results for input(s): LABPROT, INR in the last 72 hours.  STUDIES: Dg Hip Unilat With Pelvis 2-3 Views Right  Result Date: 08/25/2018 CLINICAL DATA:  Fall, RIGHT hip pain EXAM: DG HIP (WITH OR WITHOUT PELVIS) 2-3V RIGHT COMPARISON:  11/03/2017 FINDINGS: Intramedullary nail fixation of the RIGHT hip. No evidence acute fracture dislocation. The distal RIGHT femur is imaged and demonstrates no acute findings. IMPRESSION: No RIGHT femur fracture or dislocation Electronically Signed   By: Suzy Bouchard M.D.   On: 08/25/2018 16:19      Impression / Plan:   JALYSSA FLEISHER is a 57 y.o. y/o female with a history of lung cancer, status post left lobectomy, excess alcohol consumption comes into the hospital with generalized weakness, confusion.  Hemoglobin 9 months back was 13 g with an MCV of 109 but on admission it dropped to 8.6 g.  No overt blood loss noted.  BUN/creatinine ratio is normal.  This is less likely related to a upper GI bleed.  More likely a cause of malnutrition, excess alcohol consumption and bone marrow suppression.  We will perform an endoscopy to check for esophageal varices, gave.  There is no  gross evidence of cirrhosis on her labs as her platelet count is normal 208 I do not have a CMP or an INR which I will order today.  I have spoken to the doctor and obtain consent for the procedure.  I have discussed alternative options, risks & benefits,  which include, but are not limited to, bleeding, infection, perforation,respiratory complication & drug reaction.  The patient agrees with this plan & written consent will be obtained.     Thank you for involving me in the care of this patient.      LOS: 1 day   Lori Bellows, MD  08/27/2018, 7:50 AM

## 2018-08-27 NOTE — Progress Notes (Signed)
Procedure canceled by Dr. Ronelle Nigh due to new onset confusion reported by daughter.

## 2018-08-27 NOTE — Progress Notes (Addendum)
[name] NAME: Lori Rangel    MR#:  [id number]  DATE OF BIRTH:  Aug 22, 1961  SUBJECTIVE:   Family, patient more confused this morning.  She has received a couple doses of Ativan, and family is not sure if her confusion is related to the Ativan.  They feel like her confusion started before she ever got a dose of Ativan.  She has been confused since yesterday afternoon.  REVIEW OF SYSTEMS:   Unable to obtain due to AMS  DRUG ALLERGIES:   Allergies  Allergen Reactions  . Levaquin [Levofloxacin] Nausea Only    Cramps & Body aches    VITALS:  Blood pressure 134/78, pulse 98, temperature (!) 97.4 F (36.3 C), temperature source Oral, resp. rate 20, height 4\' 9"  (1.448 m), weight 37.3 kg, SpO2 94 %.  PHYSICAL EXAMINATION:  GENERAL:  57 y.o.-year-old very thin patient lying in the bed with no acute distress.  Cachecticappearing. EYES: Pupils equal, round, reactive to light and accommodation. No scleral icterus. Extraocular muscles intact.  HEENT: Head atraumatic, normocephalic. Oropharynx and nasopharynx clear.  NECK:  Supple, no jugular venous distention. No thyroid enlargement, no tenderness.  LUNGS: Normal breath sounds bilaterally, no wheezing, rales,rhonchi or crepitation. No use of accessory muscles of respiration.  CARDIOVASCULAR: S1, S2 normal. No murmurs, rubs, or gallops.  ABDOMEN: Soft, nontender, nondistended. Bowel sounds present. No organomegaly or mass.  EXTREMITIES: No pedal edema, cyanosis, or clubbing.  Severe muscle wasting and thin extremities. NEUROLOGIC: Cranial nerves II through XII are intact. +global weakness. Sensation intact. Gait not checked.  PSYCHIATRIC: The patient is alert. Oriented to person and place. Acting like the remote controller is a cigarette. SKIN: No obvious rash, lesion, or ulcer.   LABORATORY PANEL:   CBC Recent Labs  Lab 08/27/18 0507  WBC 2.9*  HGB 10.0*  HCT 28.7*  PLT 163    ------------------------------------------------------------------------------------------------------------------  Chemistries  Recent Labs  Lab 08/27/18 0507  NA 131*  K 3.5  CL 102  CO2 24  GLUCOSE 80  BUN <5*  CREATININE <0.30*  CALCIUM 7.4*  MG 2.0  AST 28  ALT 21  ALKPHOS 113  BILITOT 0.7   ------------------------------------------------------------------------------------------------------------------  Cardiac Enzymes Recent Labs  Lab 08/25/18 1159  TROPONINI <0.03   ------------------------------------------------------------------------------------------------------------------  RADIOLOGY:  Ct Head Wo Contrast  Result Date: 08/27/2018 CLINICAL DATA:  Encephalopathy EXAM: CT HEAD WITHOUT CONTRAST TECHNIQUE: Contiguous axial images were obtained from the base of the skull through the vertex without intravenous contrast. COMPARISON:  None. FINDINGS: Brain: No evidence of acute infarction, hemorrhage, extra-axial collection, ventriculomegaly, or mass effect. Generalized cerebral atrophy. Periventricular white matter low attenuation likely secondary to microangiopathy. Vascular: Cerebrovascular atherosclerotic calcifications are noted. Skull: Negative for fracture or focal lesion. Sinuses/Orbits: Visualized portions of the orbits are unremarkable. Visualized portions of the paranasal sinuses and mastoid air cells are unremarkable. Other: None. IMPRESSION: No acute intracranial pathology. Electronically Signed   By: Kathreen Devoid   On: 08/27/2018 13:34   Dg Hip Unilat With Pelvis 2-3 Views Right  Result Date: 08/25/2018 CLINICAL DATA:  Fall, RIGHT hip pain EXAM: DG HIP (WITH OR WITHOUT PELVIS) 2-3V RIGHT COMPARISON:  11/03/2017 FINDINGS: Intramedullary nail fixation of the RIGHT hip. No evidence acute fracture dislocation. The distal RIGHT femur is imaged and demonstrates no acute findings. IMPRESSION: No RIGHT femur fracture or dislocation Electronically Signed   By:  Suzy Bouchard M.D.   On: 08/25/2018 16:19    ASSESSMENT  AND PLAN:   AMS- may be due to combination of UTI, alcohol withdrawal, hypothyroidism. -Continue antibiotics for UTI -Continue CIWA -Check CT head, HIV, RPR, ammonia -Folate and B12 already checked and were normal  Macrocytic anemia- likely related to malnutrition. Hgb stable s/p 1 unit pRBCs 08/26/18. -GI following- plan for EGD tomorrow  Hypothyroidism- new diagnosis this admission -Start synthroid 32mcg daily -Needs thyroid function testing rechecked in 6 weeks  E. Coli UTI- UA consistent with infection. Urine culture with >100,000  CFU E.coli.  -Continue ceftriaxone -F/u culture sensitivities  Generalized weakness and adult failure to thrive- likely due to poor po intake, hyponatremia, anemia, UTI.  -IV fluids -PT consult -Needs to f/u with PCP for routine outpatient cancer screenings, including pap, colonoscopy, mammogram  Hyponatremia- likely related to hypovolemia, decreased po intake, and beer potomania. Slowly improving. -Continue IV fluids -Recheck in the morning  History of lung cancer s/p left upper lobectomy- recent CT chest 07/2018 without any signs of cancer recurrence. She follows at [facility].  COPD- stable, no signs of acute exacerbation -Continue home inhalers  Alcohol abuse -CIWA -MVI, thiamine, folate  Severe protein-calorie malnutrition -Dietician consult  All the records are reviewed and case discussed with Care Management/Social Workerr. Management plans discussed with the patient, family and they are in agreement.  CODE STATUS: Full.  TOTAL TIME TAKING CARE OF THIS PATIENT: 35 minutes.   POSSIBLE D/C IN 2-3 DAYS, DEPENDING ON CLINICAL CONDITION.  Berna Spare Mayo M.D on 08/27/2018   Between 7am to 6pm - Pager - [phone]  After 6pm go to [email or web address] - password [email or web address] [facility]  Office  [phone]  CC: Primary care physician; Kirk Ruths,  MD  Note: This dictation was prepared with Dragon dictation along with smaller phrase technology. Any transcriptional errors that result from this process are unintentional.

## 2018-08-27 NOTE — H&P (Signed)
[name], MD [address], [place], [place], [place], [address] [address], [place], [place], [place], [address] Phone: [phone]  Fax: [phone]  Primary Care Physician:  Kirk Ruths, MD   Pre-Procedure History & Physical: HPI:  Lori Rangel is a 57 y.o. female is here for an endoscopy    Past Medical History:  Diagnosis Date  . Asthma   . Cancer ([place])    lung  . COPD (chronic obstructive pulmonary disease) ([place])   . Shortness of breath dyspnea   . Weight decrease major recent weight loss    Past Surgical History:  Procedure Laterality Date  . BREAST BIOPSY Right   . COLONOSCOPY    . ELECTROMAGNETIC NAVIGATION BROCHOSCOPY N/A 01/02/2016   Procedure: ELECTROMAGNETIC NAVIGATION BRONCHOSCOPY;  Surgeon: Flora Lipps, MD;  Location: [facility] ORS;  Service: Cardiopulmonary;  Laterality: N/A;  . INTRAMEDULLARY (IM) NAIL INTERTROCHANTERIC Right 11/03/2017   Procedure: INTRAMEDULLARY (IM) NAIL INTERTROCHANTRIC;  Surgeon: Leim Fabry, MD;  Location: [facility] ORS;  Service: Orthopedics;  Laterality: Right;    Prior to Admission medications   Medication Sig Start Date End Date Taking? Authorizing Provider  acetaminophen (TYLENOL) 500 MG tablet Take 500-1,000 mg by mouth every 8 (eight) hours as needed for mild constipation or moderate pain.   Yes [provider]  aspirin EC 81 MG tablet Take 81 mg by mouth daily.   Yes [provider]  mirtazapine (REMERON) 15 MG tablet Take 15 mg by mouth daily.    Yes [provider]  VENTOLIN HFA 108 (90 Base) MCG/ACT inhaler INHALE 2 PUFFS INTO LUNGS EVERY 6 HOURS AS NEEDED FOR WHEEZING OR SHORTNESS OF BREATH Patient taking differently: Inhale 2 puffs into the lungs every 6 (six) hours as needed for wheezing or shortness of breath.  10/14/16  Yes Laverle Hobby, MD  budesonide-formoterol (SYMBICORT) 160-4.5 MCG/ACT inhaler Inhale 2 puffs into the lungs 2 (two) times daily. 12/21/15 01/02/16   Laverle Hobby, MD  docusate sodium (COLACE) 100 MG capsule Take 1 capsule (100 mg total) by mouth 2 (two) times daily. 11/05/17   Salary, Avel Peace, MD  enoxaparin (LOVENOX) 40 MG/0.4ML injection Inject 0.4 mLs (40 mg total) into the skin daily. 11/05/17   Reche Dixon, PA-C  guaiFENesin (ROBITUSSIN) 100 MG/5ML SOLN Take 5 mLs (100 mg total) by mouth every 4 (four) hours as needed for cough or to loosen phlegm. Patient not taking: Reported on 11/03/2017 12/17/15   Henreitta Leber, MD  megestrol (MEGACE) 400 MG/10ML suspension Take 10 mLs (400 mg total) by mouth 2 (two) times daily. Patient not taking: Reported on 11/03/2017 01/18/16   Laverle Hobby, MD  methocarbamol (ROBAXIN) 500 MG tablet Take 1 tablet (500 mg total) by mouth every 6 (six) hours as needed for muscle spasms. 11/05/17   Salary, Avel Peace, MD  ondansetron (ZOFRAN) 4 MG tablet Take 1 tablet (4 mg total) by mouth every 6 (six) hours as needed for nausea. 11/05/17   Reche Dixon, PA-C  oxyCODONE (OXY IR/ROXICODONE) 5 MG immediate release tablet Take 1-2 tablets (5-10 mg total) by mouth every 4 (four) hours as needed for moderate pain (pain score 4-6). 11/05/17   Reche Dixon, PA-C  oxyCODONE 10 MG TABS Take 1-1.5 tablets (10-15 mg total) by mouth every 4 (four) hours as needed for severe pain (pain score 7-10). 11/05/17   Salary, Holly Bodily D, MD  sodium chloride 1 g tablet Take 2 tablets (2 g total) by mouth 3 (three) times  daily with meals. 11/05/17   Salary, Avel Peace, MD  varenicline (CHANTIX CONTINUING MONTH PAK) 1 MG tablet Take 1 tablet (1 mg total) by mouth 2 (two) times daily. Patient not taking: Reported on 11/03/2017 12/29/15   Minna Merritts, MD    Allergies as of 08/25/2018 - Review Complete 08/25/2018  Allergen Reaction Noted  . Levaquin [levofloxacin] Nausea Only 12/21/2015    Family History  Problem Relation Age of Onset  . Cirrhosis Father     Social History   Socioeconomic History  . Marital status: Legally  Separated    Spouse name: Not on file  . Number of children: Not on file  . Years of education: Not on file  . Highest education level: Not on file  Occupational History  . Not on file  Social Needs  . Financial resource strain: Patient refused  . Food insecurity:    Worry: Patient refused    Inability: Patient refused  . Transportation needs:    Medical: Patient refused    Non-medical: Patient refused  Tobacco Use  . Smoking status: Current Every Day Smoker    Packs/day: 0.75    Years: 40.00    Pack years: 30.00    Types: Cigarettes  . Smokeless tobacco: Never Used  Substance and Sexual Activity  . Alcohol use: Yes    Alcohol/week: 6.0 standard drinks    Types: 6 Cans of beer per week    Comment: 6 beers daily  . Drug use: No  . Sexual activity: Never  Lifestyle  . Physical activity:    Days per week: Patient refused    Minutes per session: Patient refused  . Stress: Not on file  Relationships  . Social connections:    Talks on phone: Patient refused    Gets together: Patient refused    Attends religious service: Patient refused    Active member of club or organization: Patient refused    Attends meetings of clubs or organizations: Patient refused    Relationship status: Patient refused  . Intimate partner violence:    Fear of current or ex partner: Patient refused    Emotionally abused: Patient refused    Physically abused: Patient refused    Forced sexual activity: Patient refused  Other Topics Concern  . Not on file  Social History Narrative  . Not on file    Review of Systems: See HPI, otherwise negative ROS  Physical Exam: BP 116/77 (BP Location: Left Arm)   Pulse 99   Temp 97.6 F (36.4 C)   Resp 20   Ht 4\' 9"  (1.448 m)   Wt 37.3 kg   SpO2 100%   BMI 17.79 kg/m  General:   Alert,  pleasant and cooperative in NAD Head:  Normocephalic and atraumatic. Neck:  Supple; no masses or thyromegaly. Lungs:  Clear throughout to auscultation, normal  respiratory effort.    Heart:  +S1, +S2, Regular rate and rhythm, No edema. Abdomen:  Soft, nontender and nondistended. Normal bowel sounds, without guarding, and without rebound.   Neurologic:  Alert and  oriented x4;  grossly normal neurologically.  Impression/Plan: Lori Rangel is here for an endoscopy  to be performed for  evaluation of anemia    Risks, benefits, limitations, and alternatives regarding endoscopy have been reviewed with the patient.  Questions have been answered.  All parties agreeable.   [name], MD  08/27/2018, 7:47 AM

## 2018-08-27 NOTE — Anesthesia Preprocedure Evaluation (Signed)
Anesthesia Evaluation  Patient identified by MRN, date of birth, ID band Patient confused  General Assessment Comment:Pt with new onset confusion accorduing to daughter  History of Anesthesia Complications Negative for: history of anesthetic complications  Airway        Dental   Pulmonary asthma , COPD,  COPD inhaler, Current Smoker,           Cardiovascular (-) hypertension(-) Past MI and (-) CHF (-) dysrhythmias (-) Valvular Problems/Murmurs     Neuro/Psych neg Seizures New onset confuxion    GI/Hepatic Neg liver ROS, neg GERD  ,  Endo/Other  neg diabetes  Renal/GU negative Renal ROS     Musculoskeletal   Abdominal   Peds  Hematology   Anesthesia Other Findings   Reproductive/Obstetrics                             Anesthesia Physical Anesthesia Plan  ASA: III and emergent  Anesthesia Plan:    Post-op Pain Management:    Induction:   PONV Risk Score and Plan:   Airway Management Planned:   Additional Equipment:   Intra-op Plan:   Post-operative Plan:   Informed Consent:   Plan Discussed with:   Anesthesia Plan Comments: (Pt with new onset confusion this am according to daughter. Pt at increased risk in this setting. Spoke with daughter and Dr. Vicente Males. Will hold off on anesthesia until mental status evaluated.)        Anesthesia Quick Evaluation

## 2018-08-28 ENCOUNTER — Encounter: Payer: Self-pay | Admitting: Gastroenterology

## 2018-08-28 DIAGNOSIS — R634 Abnormal weight loss: Secondary | ICD-10-CM

## 2018-08-28 LAB — MAGNESIUM: Magnesium: 1.6 mg/dL — ABNORMAL LOW (ref 1.7–2.4)

## 2018-08-28 LAB — BASIC METABOLIC PANEL
Anion gap: 5 (ref 5–15)
BUN: <5 mg/dL — ABNORMAL LOW (ref 6–20)
CO2: 20 mmol/L — ABNORMAL LOW (ref 22–32)
Calcium: 7.4 mg/dL — ABNORMAL LOW (ref 8.9–10.3)
Chloride: 103 mmol/L (ref 98–111)
Creatinine, Ser: 0.33 mg/dL — ABNORMAL LOW (ref 0.44–1.00)
GFR calc Af Amer: >60 mL/min (ref >60)
GFR calc non Af Amer: >60 mL/min (ref >60)
Glucose, Bld: 93 mg/dL (ref 70–99)
Potassium: 4.2 mmol/L (ref 3.5–5.1)
SODIUM: 128 mmol/L — AB (ref 135–145)

## 2018-08-28 LAB — CBC
HCT: 28.3 % — ABNORMAL LOW (ref 36.0–46.0)
Hemoglobin: 10 g/dL — ABNORMAL LOW (ref 12.0–15.0)
MCH: 34.5 pg — ABNORMAL HIGH (ref 26.0–34.0)
MCHC: 35.3 g/dL (ref 30.0–36.0)
MCV: 97.6 fL (ref 80.0–100.0)
Platelets: 173 10*3/uL (ref 150–400)
RBC: 2.9 MIL/uL — ABNORMAL LOW (ref 3.87–5.11)
RDW: 14.5 % (ref 11.5–15.5)
WBC: 5.3 10*3/uL (ref 4.0–10.5)
nRBC: 0 % (ref 0.0–0.2)

## 2018-08-28 LAB — T3: T3, Total: 62 ng/dL — ABNORMAL LOW (ref 71–180)

## 2018-08-28 LAB — URINE CULTURE: Culture: >=100000 — AB

## 2018-08-28 LAB — PHOSPHORUS: Phosphorus: 2.7 mg/dL (ref 2.5–4.6)

## 2018-08-28 LAB — AMMONIA: AMMONIA: 31 umol/L (ref 9–35)

## 2018-08-28 LAB — RPR: RPR Ser Ql: NONREACTIVE

## 2018-08-28 MED ORDER — SODIUM CHLORIDE 0.9 % IV BOLUS
500.0000 mL | Freq: Once | INTRAVENOUS | Status: AC
  Administered 2018-08-28: 500 mL via INTRAVENOUS

## 2018-08-28 NOTE — Progress Notes (Signed)
PT Cancellation Note  Patient Details Name: Lori Rangel MRN: [id number] DOB: 08/26/1961   Cancelled Treatment:    Reason Eval/Treat Not Completed: Other (comment).  Pt's BP 89/63 at 1341 per chart review.  Upon PT arrival to pt's room, pt reporting feeling weak and tired and BP noted to be 86/66.  Family present and reporting significant improved cognition today.  Nurse notified immediately regarding pt's BP (plan for fluids).  Will hold PT at this time d/t low BP and noted symptoms above and will re-attempt PT treatment session at a later date/time.  Leitha Bleak, PT 08/28/18, 2:58 PM [phone]

## 2018-08-28 NOTE — Progress Notes (Signed)
[name] , MD [address] [place], [place], [place], [address] [address], [place], [place], [place], [address] Phone: [phone]  Fax: [phone]   Lori Rangel is being followed for anemia  Day 1 of follow up   Subjective: Doing well , no confusion, no bowel movements    Objective: Vital signs in last 24 hours: Vitals:   08/27/18 1426 08/27/18 2018 08/27/18 2104 08/28/18 0554  BP:  123/80  (!) 95/57  Pulse: 98 (!) 124 (!) 115 (!) 105  Resp:  20  20  Temp:  97.7 F (36.5 C)  98 F (36.7 C)  TempSrc:    Axillary  SpO2: 94% 97%  95%  Weight:      Height:       Weight change: 0 kg  Intake/Output Summary (Last 24 hours) at 08/28/2018 1339 Last data filed at 08/28/2018 1202 Gross per 24 hour  Intake 100 ml  Output -  Net 100 ml     Exam: Heart:: Regular rate and rhythm, S1S2 present or without murmur or extra heart sounds Lungs: normal, clear to auscultation and clear to auscultation and percussion Abdomen: soft, nontender, normal bowel sounds   Lab Results: @LABTEST2 @ Micro Results: Recent Results (from the past 240 hour(s))  Urine Culture     Status: Abnormal   Collection Time: 08/25/18  8:38 PM  Result Value Ref Range Status   Specimen Description   Final    URINE, RANDOM Performed at [facility], [address] [place], [place] [address]    Special Requests   Final    NONE Performed at [facility], [address] [place], [place] [address]    Culture >=100,000 COLONIES/mL ESCHERICHIA COLI (A)  Final   Report Status 08/28/2018 FINAL  Final   Organism ID, Bacteria ESCHERICHIA COLI (A)  Final      Susceptibility   Escherichia coli - MIC*    AMPICILLIN 4 SENSITIVE Sensitive     CEFAZOLIN <=4 SENSITIVE Sensitive     CEFTRIAXONE <=1 SENSITIVE Sensitive     CIPROFLOXACIN <=0.25 SENSITIVE Sensitive     GENTAMICIN <=1 SENSITIVE Sensitive     IMIPENEM <=0.25 SENSITIVE Sensitive     NITROFURANTOIN <=16 SENSITIVE  Sensitive     TRIMETH/SULFA <=20 SENSITIVE Sensitive     AMPICILLIN/SULBACTAM <=2 SENSITIVE Sensitive     PIP/TAZO <=4 SENSITIVE Sensitive     Extended ESBL NEGATIVE Sensitive     * >=100,000 COLONIES/mL ESCHERICHIA COLI   Studies/Results: Ct Head Wo Contrast  Result Date: 08/27/2018 CLINICAL DATA:  Encephalopathy EXAM: CT HEAD WITHOUT CONTRAST TECHNIQUE: Contiguous axial images were obtained from the base of the skull through the vertex without intravenous contrast. COMPARISON:  None. FINDINGS: Brain: No evidence of acute infarction, hemorrhage, extra-axial collection, ventriculomegaly, or mass effect. Generalized cerebral atrophy. Periventricular white matter low attenuation likely secondary to microangiopathy. Vascular: Cerebrovascular atherosclerotic calcifications are noted. Skull: Negative for fracture or focal lesion. Sinuses/Orbits: Visualized portions of the orbits are unremarkable. Visualized portions of the paranasal sinuses and mastoid air cells are unremarkable. Other: None. IMPRESSION: No acute intracranial pathology. Electronically Signed   By: Kathreen Devoid   On: 08/27/2018 13:34   CBC Latest Ref Rng & Units 08/28/2018 08/27/2018 08/26/2018  WBC 4.0 - 10.5 K/uL 5.3 2.9(L) -  Hemoglobin 12.0 - 15.0 g/dL 10.0(L) 10.0(L) 10.4(L)  Hematocrit 36.0 - 46.0 % 28.3(L) 28.7(L) 29.8(L)  Platelets 150 - 400 K/uL 173 163 -    Medications: I  have reviewed the patient's current medications. Scheduled Meds: . aspirin EC  81 mg Oral Daily  . docusate sodium  100 mg Oral BID  . folic acid  1 mg Oral Daily  . levothyroxine  25 mcg Oral Q0600  . mometasone-formoterol  2 puff Inhalation BID  . multivitamin with minerals  1 tablet Oral Daily  . nicotine  21 mg Transdermal Daily  . potassium chloride  20 mEq Oral BID  . protein supplement shake  11 oz Oral BID BM  . thiamine  100 mg Oral Daily   Or  . thiamine  100 mg Intravenous Daily  . vitamin C  250 mg Oral BID   Continuous  Infusions: . sodium chloride     PRN Meds:.acetaminophen **OR** acetaminophen, albuterol, LORazepam **OR** LORazepam, methocarbamol, ondansetron **OR** ondansetron (ZOFRAN) IV, polyethylene glycol, zolpidem   Assessment: Active Problems:   Hyponatremia  Lori Rangel is a 57 y.o. y/o female with a history of lung cancer, status post left lobectomy, excess alcohol consumption comes into the hospital with generalized weakness, confusion.  Hemoglobin 9 months back was 13 g with an MCV of 109 but on admission it dropped to 8.6 g.  No overt blood loss noted.  BUN/creatinine ratio is normal. Today Hb back to normal at 10 grams  This is less likely related to a upper GI bleed.  More likely a cause of malnutrition, excess alcohol consumption and bone marrow suppression. INR normal/  I had planned to perform an EGD yesterday for the weight loss, anemia but she was very confused and had to cancell the procedure. Elevated TSH likely contributing to all her symptoms.   Plan  1. Watch her today , advance diet , if tolerates well then an have evaluation as an outpatient especially since she has not had any overt blood loss. If there is any evidence of a bleed then will need to be done inpatient. Dr Allen Norris is covering tomorrow. Not had any bowel movements all yesterday and today.    LOS: 2 days   [name], MD 08/28/2018, 1:39 PM

## 2018-08-28 NOTE — Care Management Important Message (Signed)
Important Message  Patient Details  Name: Lori Rangel MRN: [id number] Date of Birth: 1961/01/26   Medicare Important Message Given:  Yes    Juliann Pulse A Layann Bluett 08/28/2018, 10:01 AM

## 2018-08-28 NOTE — NC FL2 (Signed)
[place] LEVEL OF CARE SCREENING TOOL     IDENTIFICATION  Patient Name: Lori Rangel Birthdate: 09/10/1960 Sex: female Admission Date (Current Location): 08/25/2018  [place] and [place] Number:  Engineering geologist and Address:  [facility], [address], [place], [place] [address]      Provider Number: [id number]  Attending Physician Name and Address:  Sela Hua, MD  Relative Name and Phone Number:       Current Level of Care: Hospital Recommended Level of Care: [place] Prior Approval Number:    Date Approved/Denied:   PASRR Number: [id number]  Discharge Plan: SNF    Current Diagnoses: Patient Active Problem List   Diagnosis Date Noted  . Hyponatremia 08/25/2018  . Closed right hip fracture ([place]) 11/03/2017  . Lung mass   . Carotid stenosis 12/31/2015  . PAD (peripheral artery disease) ([place]) 12/31/2015  . Smoker 12/31/2015  . Pre-operative clearance 12/31/2015  . Lung nodule 12/31/2015  . Protein-calorie malnutrition, severe 12/17/2015  . Pneumonia 12/15/2015    Orientation RESPIRATION BLADDER Height & Weight     Self, Time, Situation, Place  Normal Incontinent Weight: 82 lb 3.2 oz (37.3 kg) Height:  4\' 9"  (144.8 cm)  BEHAVIORAL SYMPTOMS/MOOD NEUROLOGICAL BOWEL NUTRITION STATUS  (none) (none) Incontinent Diet(Regular)  AMBULATORY STATUS COMMUNICATION OF NEEDS Skin   Extensive Assist Verbally Normal                       Personal Care Assistance Level of Assistance  Bathing, Feeding, Dressing Bathing Assistance: Limited assistance Feeding assistance: Independent Dressing Assistance: Limited assistance     Functional Limitations Info  Sight, Hearing, Speech Sight Info: Adequate Hearing Info: Adequate Speech Info: Adequate    SPECIAL CARE FACTORS FREQUENCY  PT (By licensed PT), OT (By licensed OT)     PT Frequency: 5 OT Frequency: 5            Contractures  Contractures Info: Not present    Additional Factors Info  Code Status, Allergies Code Status Info: Full Code Allergies Info: Levaquin           Current Medications (08/28/2018):  This is the current hospital active medication list Current Facility-Administered Medications  Medication Dose Route Frequency Provider Last Rate Last Dose  . acetaminophen (TYLENOL) tablet 650 mg  650 mg Oral Q6H PRN Mayo, Pete Pelt, MD       Or  . acetaminophen (TYLENOL) suppository 650 mg  650 mg Rectal Q6H PRN Mayo, Pete Pelt, MD      . albuterol (PROVENTIL) (2.5 MG/3ML) 0.083% nebulizer solution 2.5 mg  2.5 mg Inhalation Q6H PRN Mayo, Pete Pelt, MD      . aspirin EC tablet 81 mg  81 mg Oral Daily Mayo, Pete Pelt, MD   81 mg at 08/28/18 6144  . docusate sodium (COLACE) capsule 100 mg  100 mg Oral BID Sela Hua, MD   100 mg at 08/28/18 3154  . folic acid (FOLVITE) tablet 1 mg  1 mg Oral Daily Mayo, Pete Pelt, MD   1 mg at 08/28/18 0086  . levothyroxine (SYNTHROID, LEVOTHROID) tablet 25 mcg  25 mcg Oral Q0600 Sela Hua, MD   25 mcg at 08/28/18 0524  . LORazepam (ATIVAN) tablet 1 mg  1 mg Oral Q6H PRN Mayo, Pete Pelt, MD   1 mg at 08/26/18 1458   Or  . LORazepam (ATIVAN) injection 1 mg  1 mg  Intravenous Q6H PRN Mayo, Pete Pelt, MD   1 mg at 08/26/18 2112  . methocarbamol (ROBAXIN) tablet 500 mg  500 mg Oral Q6H PRN Mayo, Pete Pelt, MD      . mometasone-formoterol [facility]) 200-5 MCG/ACT inhaler 2 puff  2 puff Inhalation BID Mayo, Pete Pelt, MD   2 puff at 08/28/18 0949  . multivitamin with minerals tablet 1 tablet  1 tablet Oral Daily Mayo, Pete Pelt, MD   1 tablet at 08/28/18 0927  . nicotine (NICODERM CQ - dosed in mg/24 hours) patch 21 mg  21 mg Transdermal Daily Vaughan Basta, MD   21 mg at 08/28/18 0950  . ondansetron (ZOFRAN) tablet 4 mg  4 mg Oral Q6H PRN Mayo, Pete Pelt, MD   4 mg at 08/27/18 1633   Or  . ondansetron (ZOFRAN) injection 4 mg  4 mg Intravenous Q6H PRN Mayo, Pete Pelt, MD      . polyethylene glycol (MIRALAX / GLYCOLAX) packet 17 g  17 g Oral Daily PRN Mayo, Pete Pelt, MD      . potassium chloride (KLOR-CON) packet 20 mEq  20 mEq Oral BID Vaughan Basta, MD   20 mEq at 08/28/18 0927  . protein supplement (PREMIER PROTEIN) liquid - approved for s/p bariatric surgery  11 oz Oral BID BM Mayo, Pete Pelt, MD   11 oz at 08/28/18 0263  . sodium chloride 0.9 % bolus 500 mL  500 mL Intravenous Once Mayo, Pete Pelt, MD      . thiamine (VITAMIN B-1) tablet 100 mg  100 mg Oral Daily Mayo, Pete Pelt, MD   100 mg at 08/28/18 7858   Or  . thiamine (B-1) injection 100 mg  100 mg Intravenous Daily Mayo, Pete Pelt, MD      . vitamin C (ASCORBIC ACID) tablet 250 mg  250 mg Oral BID Mayo, Pete Pelt, MD   250 mg at 08/28/18 8502  . zolpidem (AMBIEN) tablet 5 mg  5 mg Oral QHS PRN Vaughan Basta, MD         Discharge Medications: Please see discharge summary for a list of discharge medications.  Relevant Imaging Results:  Relevant Lab Results:   Additional Information SSN: [id number]  Annamaria Boots, [place]

## 2018-08-28 NOTE — Clinical Social Work Placement (Signed)
   CLINICAL SOCIAL WORK PLACEMENT  NOTE  Date:  08/28/2018  Patient Details  Name: Lori Rangel MRN: [id number] Date of Birth: [birth year]  Clinical Social Work is seeking post-discharge placement for this patient at the [place] level of care (*CSW will initial, date and re-position this form in  chart as items are completed):  Yes   Patient/family provided with [place] Work Department's list of facilities offering this level of care within the geographic area requested by the patient (or if unable, by the patient's family).  Yes   Patient/family informed of their freedom to choose among providers that offer the needed level of care, that participate in Medicare, Medicaid or managed care program needed by the patient, have an available bed and are willing to accept the patient.  Yes   Patient/family informed of [facility]'s ownership interest in [facility] and [facility], as well as of the fact that they are under no obligation to receive care at these facilities.  PASRR submitted to EDS on 08/28/18     PASRR number received on       Existing PASRR number confirmed on 08/28/18     FL2 transmitted to all facilities in geographic area requested by pt/family on 08/28/18     FL2 transmitted to all facilities within larger geographic area on       Patient informed that his/her managed care company has contracts with or will negotiate with certain facilities, including the following:            Patient/family informed of bed offers received.  Patient chooses bed at       Physician recommends and patient chooses bed at      Patient to be transferred to   on  .  Patient to be transferred to facility by       Patient family notified on   of transfer.  Name of family member notified:        PHYSICIAN       Additional Comment:    _______________________________________________ Annamaria Boots, [place] 08/28/2018, 2:16 PM

## 2018-08-28 NOTE — Progress Notes (Signed)
Lori Rangel at [place] NAME: Lori Rangel    MR#:  [id number]  DATE OF BIRTH:  December 18, 1960  SUBJECTIVE:   Confusion has resolved today.  She states she is feeling much better.  She was able to tolerate a cheeseburger and fries.  She has been eating protein shakes mixed with ice cream.  No nausea, vomiting, abdominal pain  REVIEW OF SYSTEMS:   Constitutional: Positive for malaise/fatigue and weight loss. Negative for chills and fever.  HENT: Negative for congestion and sore throat.   Eyes: Negative for blurred vision and double vision.  Respiratory: Negative for cough and shortness of breath.   Cardiovascular: Negative for chest pain, palpitations and leg swelling.  Gastrointestinal:  Negative for abdominal pain, nausea, and vomiting.  Genitourinary: Negative for dysuria and urgency.  Musculoskeletal: Positive for falls. Negative for joint pain.  Neurological: Positive for weakness. Negative for dizziness, focal weakness and headaches.  Psychiatric/Behavioral: Negative for depression. The patient is not nervous/anxious  DRUG ALLERGIES:   Allergies  Allergen Reactions  . Levaquin [Levofloxacin] Nausea Only    Cramps & Body aches    VITALS:  Blood pressure 93/68, pulse (!) 101, temperature 98 F (36.7 C), temperature source Axillary, resp. rate 18, height 4\' 9"  (1.448 m), weight 37.3 kg, SpO2 97 %.  PHYSICAL EXAMINATION:  GENERAL:  57 y.o.-year-old very thin patient lying in the bed with no acute distress.  Cachectic-appearing. EYES: Pupils equal, round, reactive to light and accommodation. No scleral icterus. Extraocular muscles intact.  HEENT: Head atraumatic, normocephalic. Oropharynx and nasopharynx clear.  NECK:  Supple, no jugular venous distention. No thyroid enlargement, no tenderness.  LUNGS: Normal breath sounds bilaterally, no wheezing, rales,rhonchi or crepitation. No use of accessory muscles of respiration.  CARDIOVASCULAR:  S1, S2 normal. No murmurs, rubs, or gallops.  ABDOMEN: Soft, nontender, nondistended. Bowel sounds present. No organomegaly or mass.  EXTREMITIES: No pedal edema, cyanosis, or clubbing.  Severe muscle wasting and thin extremities. NEUROLOGIC: Cranial nerves II through XII are intact. +global weakness. Sensation intact. Gait not checked.  PSYCHIATRIC: The patient is alert, oriented x 3, answering questions appropriately. SKIN: No obvious rash, lesion, or ulcer.   LABORATORY PANEL:   CBC Recent Labs  Lab 08/28/18 0356  WBC 5.3  HGB 10.0*  HCT 28.3*  PLT 173   ------------------------------------------------------------------------------------------------------------------  Chemistries  Recent Labs  Lab 08/27/18 0507 08/28/18 0356  NA 131* 128*  K 3.5 4.2  CL 102 103  CO2 24 20*  GLUCOSE 80 93  BUN <5* <5*  CREATININE <0.30* 0.33*  CALCIUM 7.4* 7.4*  MG 2.0 1.6*  AST 28  --   ALT 21  --   ALKPHOS 113  --   BILITOT 0.7  --    ------------------------------------------------------------------------------------------------------------------  Cardiac Enzymes Recent Labs  Lab 08/25/18 1159  TROPONINI <0.03   ------------------------------------------------------------------------------------------------------------------  RADIOLOGY:  Ct Head Wo Contrast  Result Date: 08/27/2018 CLINICAL DATA:  Encephalopathy EXAM: CT HEAD WITHOUT CONTRAST TECHNIQUE: Contiguous axial images were obtained from the base of the skull through the vertex without intravenous contrast. COMPARISON:  None. FINDINGS: Brain: No evidence of acute infarction, hemorrhage, extra-axial collection, ventriculomegaly, or mass effect. Generalized cerebral atrophy. Periventricular white matter low attenuation likely secondary to microangiopathy. Vascular: Cerebrovascular atherosclerotic calcifications are noted. Skull: Negative for fracture or focal lesion. Sinuses/Orbits: Visualized portions of the orbits are  unremarkable. Visualized portions of the paranasal sinuses and mastoid air cells are unremarkable. Other: None. IMPRESSION: No acute intracranial  pathology. Electronically Signed   By: Kathreen Devoid   On: 08/27/2018 13:34    ASSESSMENT AND PLAN:   AMS- may be due to combination of UTI, alcohol withdrawal, hypothyroidism.  AMS resolved this morning.  CT head negative.  Ammonia normal. -Continue antibiotics for UTI -Continue CIWA  Macrocytic anemia- likely related to malnutrition. Hgb stable s/p 1 unit pRBCs 08/26/18. -GI following- plan for outpatient EGD  Hypothyroidism- new diagnosis this admission -Synthroid 7mcg daily -Needs thyroid function testing rechecked in 6 weeks  E. Coli UTI- UA consistent with infection. Urine culture with >100,000  CFU E.coli pansensitive. -Continue ceftriaxone x 3 days (last day is today)  Generalized weakness and adult failure to thrive- likely due to poor po intake, hyponatremia, anemia, UTI.  -Encouraged po intake -PT consult- recommended SNF -Needs to f/u with PCP for routine outpatient cancer screenings, including pap, colonoscopy, mammogram  Hyponatremia- likely related to hypovolemia, decreased po intake, and beer potomania. Stable. -Continue IV fluids -Recheck in the morning  History of lung cancer s/p left upper lobectomy- recent CT chest 07/2018 without any signs of cancer recurrence. She follows at [facility].  COPD- stable, no signs of acute exacerbation -Continue home inhalers  Alcohol abuse -CIWA -MVI, thiamine, folate  Severe protein-calorie malnutrition -Dietician consult  All the records are reviewed and case discussed with Care Management/Social Workerr. Management plans discussed with the patient, family and they are in agreement.  CODE STATUS: Full.  TOTAL TIME TAKING CARE OF THIS PATIENT: 50 minutes.   POSSIBLE D/C tomorrow, DEPENDING ON CLINICAL CONDITION.  Berna Spare Mayo M.D on 08/28/2018   Between 7am to 6pm - Pager  - [phone]  After 6pm go to [email or web address] - password [email or web address] [facility]  Office  [phone]  CC: Primary care physician; Kirk Ruths, MD  Note: This dictation was prepared with Dragon dictation along with smaller phrase technology. Any transcriptional errors that result from this process are unintentional.

## 2018-08-28 NOTE — Clinical Social Work Note (Signed)
Clinical Social Work Assessment  Patient Details  Name: Lori Rangel MRN: [id number] Date of Birth: 04-Feb-1961  Date of referral:  08/28/18               Reason for consult:  Facility Placement                Permission sought to share information with:  Case Manager, Customer service manager, Family Supports Permission granted to share information::  Yes, Verbal Permission Granted  Name::      SNF  Agency::   [facility]  Relationship::     Contact Information:     Housing/Transportation Living arrangements for the past 2 months:  Single Family Home Source of Information:  Patient Patient Interpreter Needed:  None Criminal Activity/Legal Involvement Pertinent to Current Situation/Hospitalization:  No - Comment as needed Significant Relationships:  Adult Children, Siblings, Other Family Members Lives with:  Self Do you feel safe going back to the place where you live?  Yes Need for family participation in patient care:  Yes (Comment)  Care giving concerns:  Patient lives at home alone in [place]    Social Worker assessment / plan:  CSW consulted for SNF placement. CSW met with patient and daughter Lori Rangel at bedside. Patient's sister and other family members were also present. CSW introduced self and explained role. Patient reports that she was living alone prior to hospitalization. Patient reports that she was completely independent before coming to the hospital. CSW explained PT recommendation of SNF. Patient states that she has never been to SNF rehab before but would like to go due to her current weakness. CSW explained bed search and provided patient with a copy of Nursing home compare list from [email or web address]. Patient and family are in agreement with SNF at discharge and with bed search. CSW will begin bed search and give offers once received. CSW will follow for discharge planning.    Employment status:  Retired Forensic scientist:  [employer] PT  Recommendations:  [place] / Referral to community resources:  [place]  Patient/Family's Response to care:  Patient and family thanked CSW for assistance   Patient/Family's Understanding of and Emotional Response to Diagnosis, Current Treatment, and Prognosis:  Patient understands discharge plan and is in agreement    Emotional Assessment Appearance:  Appears stated age Attitude/Demeanor/Rapport:    Affect (typically observed):  Accepting, Pleasant, Quiet Orientation:  Oriented to Self, Oriented to Place, Oriented to  Time, Oriented to Situation Alcohol / Substance use:  Not Applicable Psych involvement (Current and /or in the community):  No (Comment)  Discharge Needs  Concerns to be addressed:  Discharge Planning Concerns Readmission within the last 30 days:  No Current discharge risk:  Dependent with Mobility Barriers to Discharge:  Continued Medical Work up   [employer], [place] 08/28/2018, 2:10 PM

## 2018-08-29 LAB — BASIC METABOLIC PANEL
Anion gap: 5 (ref 5–15)
BUN: <5 mg/dL — ABNORMAL LOW (ref 6–20)
CO2: 22 mmol/L (ref 22–32)
Calcium: 7.5 mg/dL — ABNORMAL LOW (ref 8.9–10.3)
Chloride: 101 mmol/L (ref 98–111)
Creatinine, Ser: 0.32 mg/dL — ABNORMAL LOW (ref 0.44–1.00)
GFR calc non Af Amer: >60 mL/min (ref >60)
Glucose, Bld: 86 mg/dL (ref 70–99)
Potassium: 3.7 mmol/L (ref 3.5–5.1)
Sodium: 128 mmol/L — ABNORMAL LOW (ref 135–145)

## 2018-08-29 LAB — CBC
HCT: 30 % — ABNORMAL LOW (ref 36.0–46.0)
Hemoglobin: 10.4 g/dL — ABNORMAL LOW (ref 12.0–15.0)
MCH: 34.6 pg — ABNORMAL HIGH (ref 26.0–34.0)
MCHC: 34.7 g/dL (ref 30.0–36.0)
MCV: 99.7 fL (ref 80.0–100.0)
NRBC: 0 % (ref 0.0–0.2)
Platelets: 193 10*3/uL (ref 150–400)
RBC: 3.01 MIL/uL — ABNORMAL LOW (ref 3.87–5.11)
RDW: 14.2 % (ref 11.5–15.5)
WBC: 4.8 10*3/uL (ref 4.0–10.5)

## 2018-08-29 LAB — MAGNESIUM: Magnesium: 1.6 mg/dL — ABNORMAL LOW (ref 1.7–2.4)

## 2018-08-29 LAB — HIV ANTIBODY (ROUTINE TESTING W REFLEX): HIV Screen 4th Generation wRfx: NONREACTIVE

## 2018-08-29 MED ORDER — ADULT MULTIVITAMIN W/MINERALS CH: 1.0000 | ORAL_TABLET | Freq: Every day | ORAL | 0 refills | Status: AC

## 2018-08-29 MED ORDER — LEVOTHYROXINE SODIUM 25 MCG PO TABS: 25.0000 ug | ORAL_TABLET | Freq: Every day | ORAL | 0 refills | Status: DC

## 2018-08-29 MED ORDER — FOLIC ACID 1 MG PO TABS: 1.0000 mg | ORAL_TABLET | Freq: Every day | ORAL | 0 refills | Status: DC

## 2018-08-29 MED ORDER — PREMIER PROTEIN SHAKE: 11.0000 [oz_av] | Freq: Two times a day (BID) | ORAL | 0 refills | Status: DC

## 2018-08-29 MED ORDER — THIAMINE HCL 100 MG PO TABS: 100.0000 mg | ORAL_TABLET | Freq: Every day | ORAL | 0 refills | Status: DC

## 2018-08-29 MED ORDER — ASCORBIC ACID 250 MG PO TABS: 250.0000 mg | ORAL_TABLET | Freq: Two times a day (BID) | ORAL | 0 refills | Status: DC

## 2018-08-29 NOTE — Discharge Instructions (Signed)
It was so nice to meet you during this hospitalization!  You came into the hospital because you weren't eating, you were dehydrated, and you were weak.  We found that your thyroid was not working like it should. Please take Synthroid 72mcg every morning before breakfast.  We have also started some vitamins to help with your malnutrition- you should take a multivitamin, folate, thiamine, and vitamin C ever day.  Take care, Dr. Brett Albino

## 2018-08-29 NOTE — Discharge Summary (Signed)
Lori Rangel at [place] NAME: Lori Rangel    MR#:  [id number]  DATE OF BIRTH:  1961/05/28  DATE OF ADMISSION:  08/25/2018   ADMITTING PHYSICIAN: Sela Hua, MD  DATE OF DISCHARGE: 08/29/18  PRIMARY CARE PHYSICIAN: Kirk Ruths, MD   ADMISSION DIAGNOSIS:  Hyponatremia [E87.1] Weakness [R53.1] Fall [W19.XXXA] DISCHARGE DIAGNOSIS:  Active Problems:   Hyponatremia  SECONDARY DIAGNOSIS:   Past Medical History:  Diagnosis Date  . Asthma   . Cancer ([place])    lung  . COPD (chronic obstructive pulmonary disease) ([place])   . Shortness of breath dyspnea   . Weight decrease major recent weight loss   HOSPITAL COURSE:   Lori Rangel is a 57 year old female who presents to the ED with poor appetite and generalized weakness over the last 9 months.  Her weakness got so bad that she was unable to walk.  She was admitted for further management.  Hypothyroidism- new diagnosis this admission -Started Synthroid 43mcg daily -Needs thyroid function testing rechecked in 6 weeks  AMS-resolved  -Patient did have one episode of altered mental status, likely due to combination of UTI, alcohol withdrawal, hypothyroidism.  AMS resolved this morning.   -CT head negative.   -Ammonia normal. -Treated for UTI  Macrocytic anemia-likely related to malnutrition. Hgb stable s/p 1 unit pRBCs 08/26/18. -Follow-up with GI as an outpatient for possible EGD  E. Coli UTI-  Urine culture with >100,000  CFU E.coli pansensitive. -Treated with ceftriaxone x 3 days  Generalized weakness and adult failure to thrive- likely due to poor po intake, hyponatremia, anemia, UTI. -PO intake improved -PT consult- recommended SNF -Needsto f/u with PCP forroutine outpatient cancer screenings,including pap, colonoscopy, mammogram  Hyponatremia- likely related to hypovolemia,decreased pointake, and beer potomania. Improved. -Treated with IVFs  History of  lung cancers/pleft upper lobectomy- recent CT chest11/2019without any signs of cancer recurrence. She follows at [facility].  COPD- stable, no signs of acute exacerbation -Continued home inhalers  Alcohol abuse -Prescribed MVI, thiamine, folate  Severe protein-calorie malnutrition -Dietician consult- recommended Premier protein shake mixed with ice cream bid between meals   DISCHARGE CONDITIONS:  Macrocytic anemia Hypothyroidism E. coli UTI Generalized weakness and adult failure to thrive Hyponatremia History of lung cancer Alcohol abuse Severe protein-calorie malnutrition CONSULTS OBTAINED:  Treatment Team:  Lucilla Lame, MD DRUG ALLERGIES:   Allergies  Allergen Reactions  . Levaquin [Levofloxacin] Nausea Only    Cramps & Body aches   DISCHARGE MEDICATIONS:   Allergies as of 08/29/2018      Reactions   Levaquin [levofloxacin] Nausea Only   Cramps & Body aches      Medication List    STOP taking these medications   enoxaparin 40 MG/0.4ML injection Commonly known as:  LOVENOX   guaiFENesin 100 MG/5ML Soln Commonly known as:  ROBITUSSIN   megestrol 400 MG/10ML suspension Commonly known as:  MEGACE   mirtazapine 15 MG tablet Commonly known as:  REMERON   sodium chloride 1 g tablet   varenicline 1 MG tablet Commonly known as:  CHANTIX CONTINUING MONTH PAK     TAKE these medications   acetaminophen 500 MG tablet Commonly known as:  TYLENOL Take 500-1,000 mg by mouth every 8 (eight) hours as needed for mild constipation or moderate pain.   ascorbic acid 250 MG tablet Commonly known as:  VITAMIN C Take 1 tablet (250 mg total) by mouth 2 (two) times daily.   aspirin EC 81 MG tablet Take  81 mg by mouth daily.   budesonide-formoterol 160-4.5 MCG/ACT inhaler Commonly known as:  SYMBICORT Inhale 2 puffs into the lungs 2 (two) times daily.   docusate sodium 100 MG capsule Commonly known as:  COLACE Take 1 capsule (100 mg total) by mouth 2 (two) times  daily.   folic acid 1 MG tablet Commonly known as:  FOLVITE Take 1 tablet (1 mg total) by mouth daily.   levothyroxine 25 MCG tablet Commonly known as:  SYNTHROID, LEVOTHROID Take 1 tablet (25 mcg total) by mouth daily before breakfast.   methocarbamol 500 MG tablet Commonly known as:  ROBAXIN Take 1 tablet (500 mg total) by mouth every 6 (six) hours as needed for muscle spasms.   multivitamin with minerals Tabs tablet Take 1 tablet by mouth daily.   ondansetron 4 MG tablet Commonly known as:  ZOFRAN Take 1 tablet (4 mg total) by mouth every 6 (six) hours as needed for nausea.   oxyCODONE 5 MG immediate release tablet Commonly known as:  Oxy IR/ROXICODONE Take 1-2 tablets (5-10 mg total) by mouth every 4 (four) hours as needed for moderate pain (pain score 4-6). What changed:  Another medication with the same name was removed. Continue taking this medication, and follow the directions you see here.   protein supplement shake Liqd Commonly known as:  PREMIER PROTEIN Take 325 mLs (11 oz total) by mouth 2 (two) times daily between meals. Mixed with ice cream   thiamine 100 MG tablet Take 1 tablet (100 mg total) by mouth daily.   VENTOLIN HFA 108 (90 Base) MCG/ACT inhaler Generic drug:  albuterol INHALE 2 PUFFS INTO LUNGS EVERY 6 HOURS AS NEEDED FOR WHEEZING OR SHORTNESS OF BREATH What changed:  See the new instructions.        DISCHARGE INSTRUCTIONS:  1.  Follow-up with PCP in 5 days 2.  Needs CBC rechecked as an outpatient 3.  Follow-up with GI in 1 to 2 weeks 4.  Started on Synthroid 25 mcg daily.  Needs thyroid studies rechecked in 6 weeks. DIET:  Regular diet Continue protein shake (Premier Protein + ice cream) BID.   Provide Magic cup TID with meals, each supplement provides 290 kcal and 9 grams of protein.  Continue MVI daily, thiamine 619 mg daily, folic acid daily.  Also provide vitamin C 250 mg BID. DISCHARGE CONDITION:  Stable ACTIVITY:  Activity  as tolerated OXYGEN:  Home Oxygen: No.  Oxygen Delivery: room air DISCHARGE LOCATION:  nursing home   If you experience worsening of your admission symptoms, develop shortness of breath, life threatening emergency, suicidal or homicidal thoughts you must seek medical attention immediately by calling 911 or calling your MD immediately  if symptoms less severe.  You Must read complete instructions/literature along with all the possible adverse reactions/side effects for all the Medicines you take and that have been prescribed to you. Take any new Medicines after you have completely understood and accpet all the possible adverse reactions/side effects.   Please note  You were cared for by a hospitalist during your hospital stay. If you have any questions about your discharge medications or the care you received while you were in the hospital after you are discharged, you can call the unit and asked to speak with the hospitalist on call if the hospitalist that took care of you is not available. Once you are discharged, your primary care physician will handle any further medical issues. Please note that NO REFILLS for any discharge medications will  be authorized once you are discharged, as it is imperative that you return to your primary care physician (or establish a relationship with a primary care physician if you do not have one) for your aftercare needs so that they can reassess your need for medications and monitor your lab values.    On the day of Discharge:  VITAL SIGNS:  Blood pressure 105/75, pulse (!) 102, temperature 97.8 F (36.6 C), temperature source Oral, resp. rate 15, height 4\' 9"  (1.448 m), weight 37.3 kg, SpO2 99 %. PHYSICAL EXAMINATION:  GENERAL:  57 y.o.-year-old very thin patient lying in the bed with no acute distress.  Cachectic-appearing. EYES: Pupils equal, round, reactive to light and accommodation. No scleral icterus. Extraocular muscles intact.  HEENT: Head  atraumatic, normocephalic. Oropharynx and nasopharynx clear.  NECK:  Supple, no jugular venous distention. No thyroid enlargement, no tenderness.  LUNGS: Normal breath sounds bilaterally, no wheezing, rales,rhonchi or crepitation. No use of accessory muscles of respiration.  CARDIOVASCULAR: S1, S2 normal. No murmurs, rubs, or gallops.  ABDOMEN: Soft, nontender, nondistended. Bowel sounds present. No organomegaly or mass.  EXTREMITIES: No pedal edema, cyanosis, or clubbing.  Severe muscle wasting and thin extremities. NEUROLOGIC: Cranial nerves II through XII are intact. +global weakness. Sensation intact. Gait not checked.  PSYCHIATRIC: The patient is alert, oriented x 3, answering questions appropriately. SKIN: No obvious rash, lesion, or ulcer.  DATA REVIEW:   CBC Recent Labs  Lab 08/29/18 0436  WBC 4.8  HGB 10.4*  HCT 30.0*  PLT 193    Chemistries  Recent Labs  Lab 08/27/18 0507  08/29/18 0436  NA 131*   < > 128*  K 3.5   < > 3.7  CL 102   < > 101  CO2 24   < > 22  GLUCOSE 80   < > 86  BUN <5*   < > <5*  CREATININE <0.30*   < > 0.32*  CALCIUM 7.4*   < > 7.5*  MG 2.0   < > 1.6*  AST 28  --   --   ALT 21  --   --   ALKPHOS 113  --   --   BILITOT 0.7  --   --    < > = values in this interval not displayed.     Microbiology Results  Results for orders placed or performed during the hospital encounter of 08/25/18  Urine Culture     Status: Abnormal   Collection Time: 08/25/18  8:38 PM  Result Value Ref Range Status   Specimen Description   Final    URINE, RANDOM Performed at [facility], [address] [place], [place] [address]    Special Requests   Final    NONE Performed at [facility], [address] [place], [place] [address]    Culture >=100,000 COLONIES/mL ESCHERICHIA COLI (A)  Final   Report Status 08/28/2018 FINAL  Final   Organism ID, Bacteria ESCHERICHIA COLI (A)  Final      Susceptibility   Escherichia coli - MIC*     AMPICILLIN 4 SENSITIVE Sensitive     CEFAZOLIN <=4 SENSITIVE Sensitive     CEFTRIAXONE <=1 SENSITIVE Sensitive     CIPROFLOXACIN <=0.25 SENSITIVE Sensitive     GENTAMICIN <=1 SENSITIVE Sensitive     IMIPENEM <=0.25 SENSITIVE Sensitive     NITROFURANTOIN <=16 SENSITIVE Sensitive     TRIMETH/SULFA <=20 SENSITIVE Sensitive     AMPICILLIN/SULBACTAM <=2 SENSITIVE Sensitive  PIP/TAZO <=4 SENSITIVE Sensitive     Extended ESBL NEGATIVE Sensitive     * >=100,000 COLONIES/mL ESCHERICHIA COLI    RADIOLOGY:  No results found.   Management plans discussed with the patient, family and they are in agreement.  CODE STATUS: Full Code   TOTAL TIME TAKING CARE OF THIS PATIENT: 45 minutes.    Berna Spare Brinsley Wence M.D on 08/29/2018 at 9:26 AM  Between 7am to 6pm - Pager - [phone]  After 6pm go to [email or web address] - [employer] [facility]  Office  [phone]  CC: Primary care physician; Kirk Ruths, MD   Note: This dictation was prepared with Dragon dictation along with smaller phrase technology. Any transcriptional errors that result from this process are unintentional.

## 2018-08-29 NOTE — Plan of Care (Signed)
Pt being d/ced to [facility].  She will be on the [place].  Called report to [employer].  Removed IV and EMS called for transport. Pt still only oriented to self and maybe place. Family is very involved. She is better when they are around. She scored a 0 on CIWA scale. Will d/c on abx for tx of UTI. VS stable after receiving fluid boluses last night. Na level still low at 128.

## 2018-08-29 NOTE — Clinical Social Work Note (Signed)
The patient will discharge today to [facility] via non-emergent EMS. [name] family and facility are aware and in agreement. The CSW will send updated discharge information to the facility and will deliver the discharge packet once the discharge summary is available. After that time, the CSW will sign off. Please consult should needs arise.  Santiago Bumpers, MSW, Latanya Presser [phone]

## 2018-10-08 ENCOUNTER — Ambulatory Visit: Payer: Medicare Other | Admitting: Gastroenterology

## 2018-10-09 DIAGNOSIS — F1011 Alcohol abuse, in remission: Secondary | ICD-10-CM | POA: Insufficient documentation

## 2018-10-09 DIAGNOSIS — F1010 Alcohol abuse, uncomplicated: Secondary | ICD-10-CM | POA: Insufficient documentation

## 2019-02-07 IMAGING — CT CT HEAD W/O CM
3 series · 15 of 47 positions shown, 18 images · non-contrast
Comparison: None.

CLINICAL DATA: Encephalopathy

EXAM:
CT HEAD WITHOUT CONTRAST
TECHNIQUE: Contiguous axial images were obtained from the base of the skull
through the vertex without intravenous contrast.

[Series 2: head wo · axial · 0.40mm/px · z∈[-145,-20]mm · 9 of 30 slices shown, 12 images]
[im 3/30  brain]
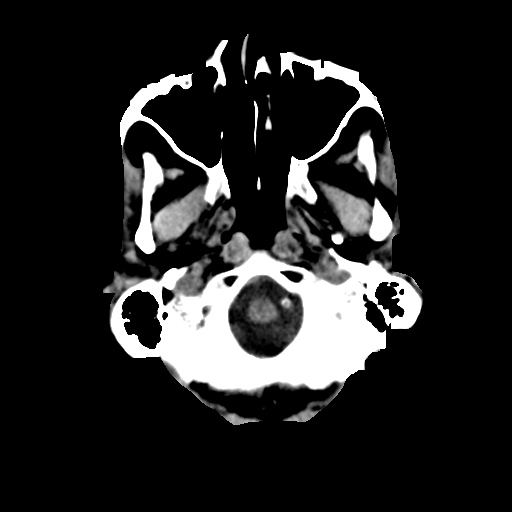
[im 3/30  bone]
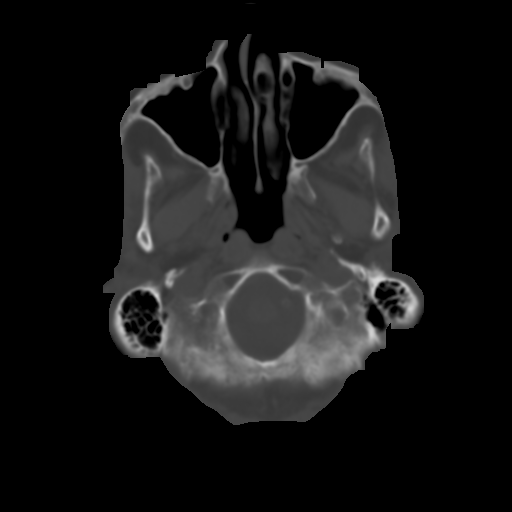
[im 6/30  brain]
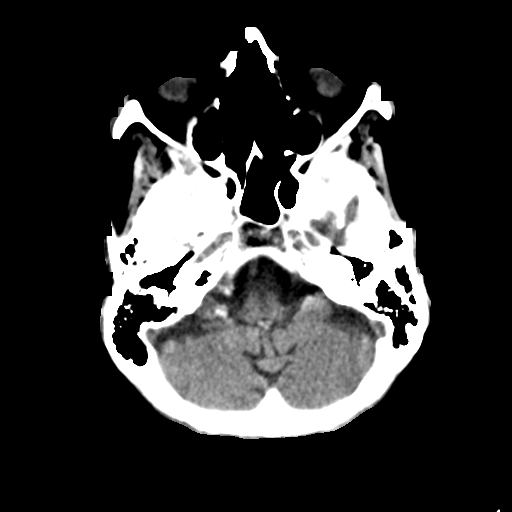
[im 9/30  brain]
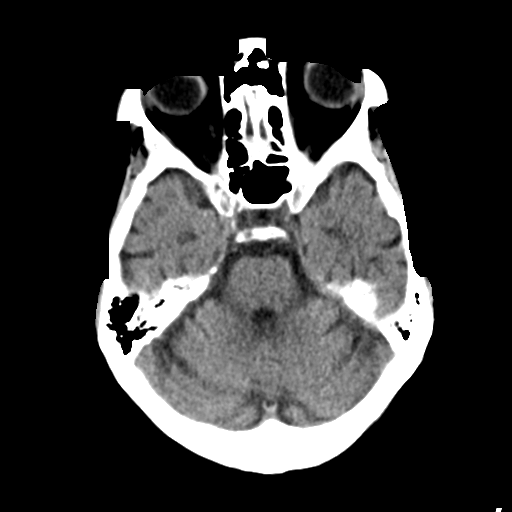
[im 12/30  brain]
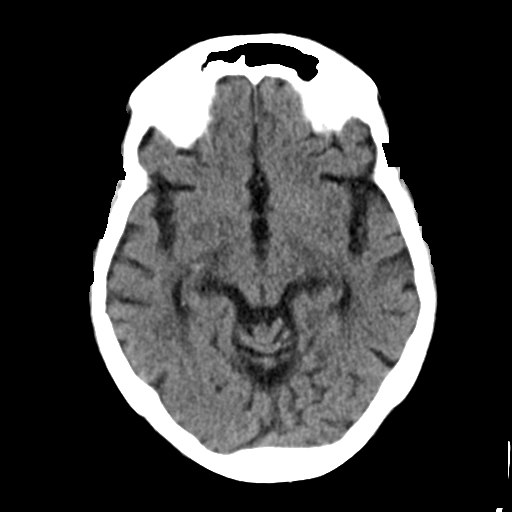
[im 16/30  brain]
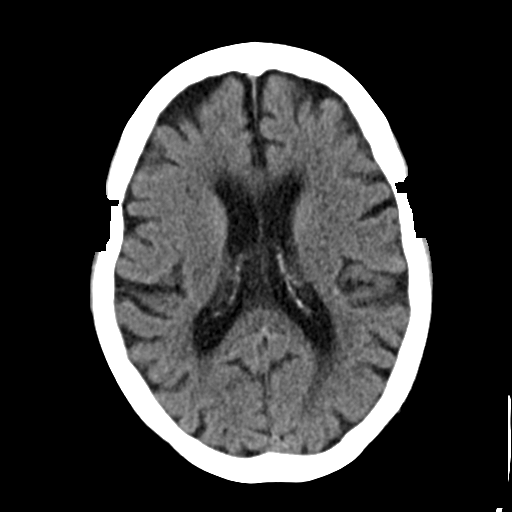
[im 16/30  bone]
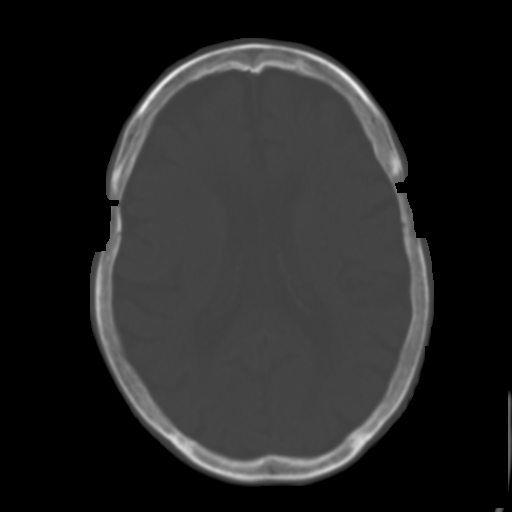
[im 19/30  brain]
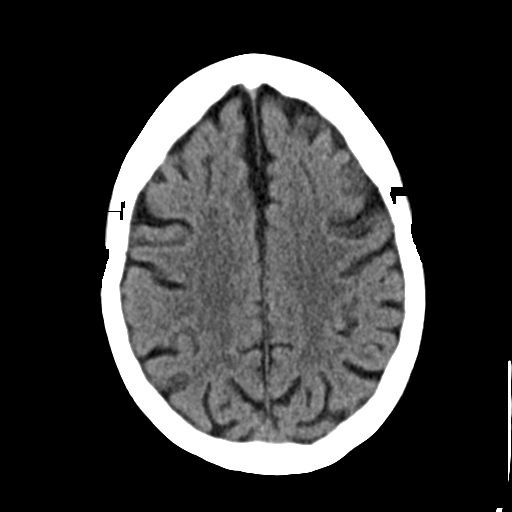
[im 22/30  brain]
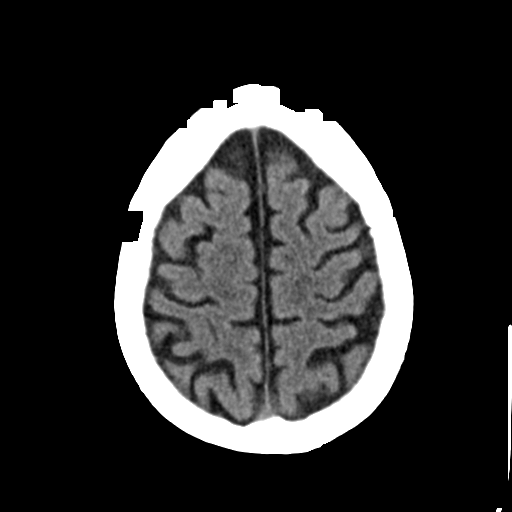
[im 25/30  brain]
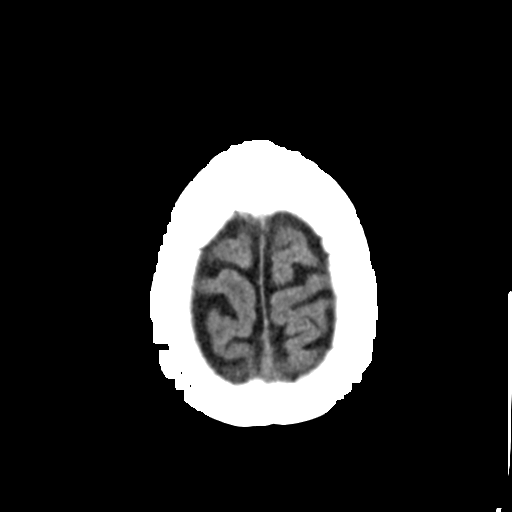
[im 28/30  brain]
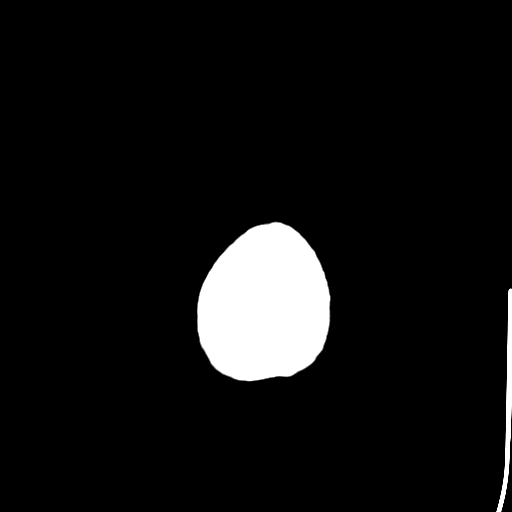
[im 28/30  bone]
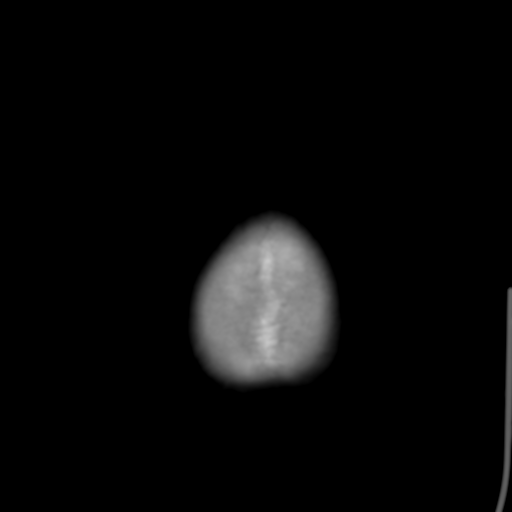

[Series 4: coronal soft tissue · coronal · 0.28mm/px · 3 of 62 slices shown]
[im 21/62  brain]
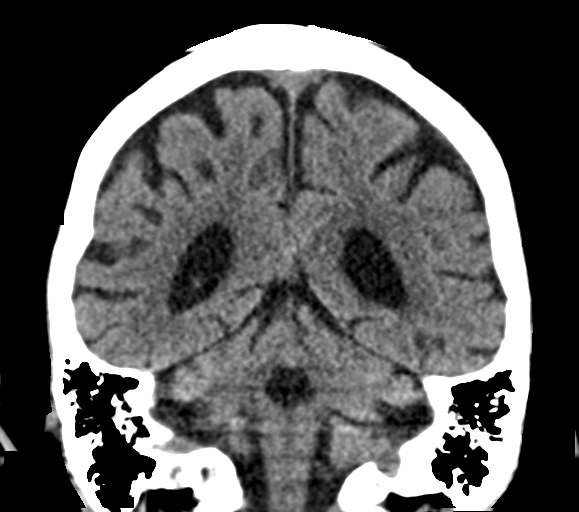
[im 28/62  brain]
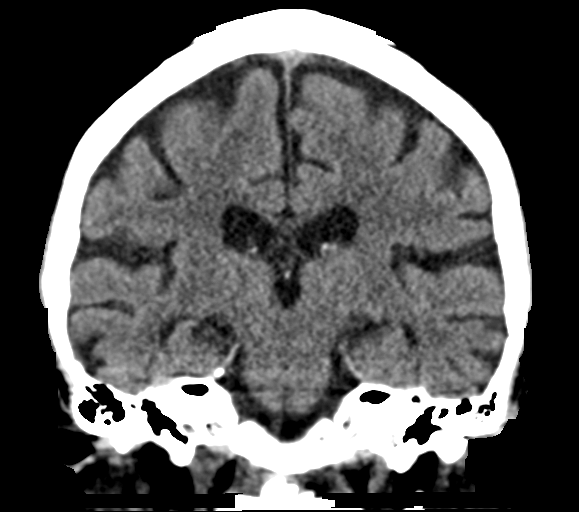
[im 34/62  brain]
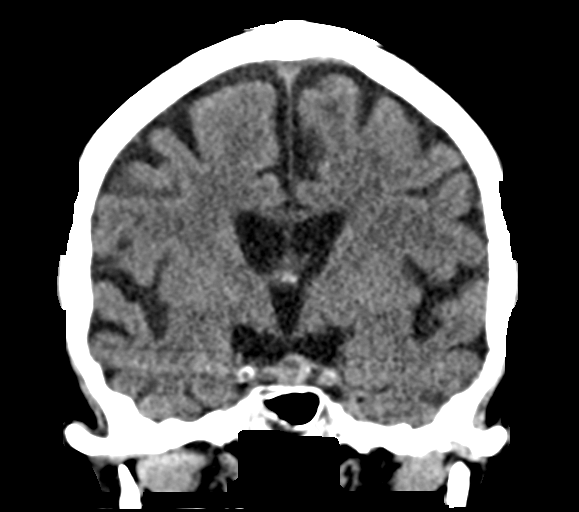

[Series 5: sagittal soft tissue · sagittal · 0.26mm/px · 3 of 49 slices shown]
[im 17/49  brain]
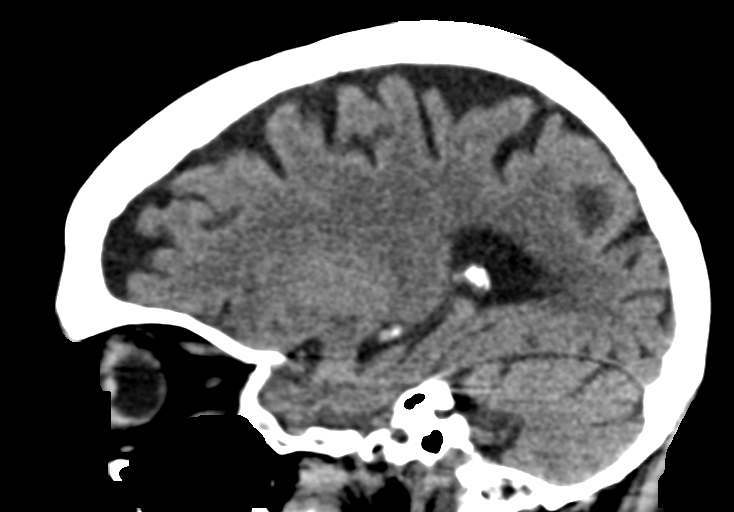
[im 25/49  brain]
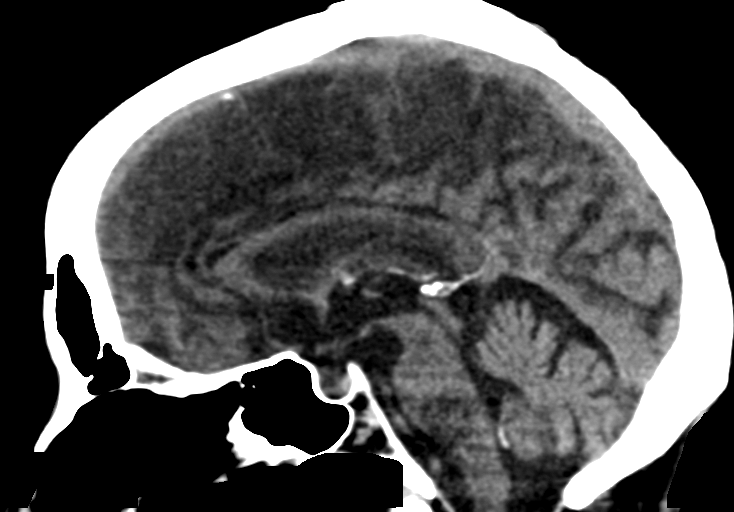
[im 33/49  brain]
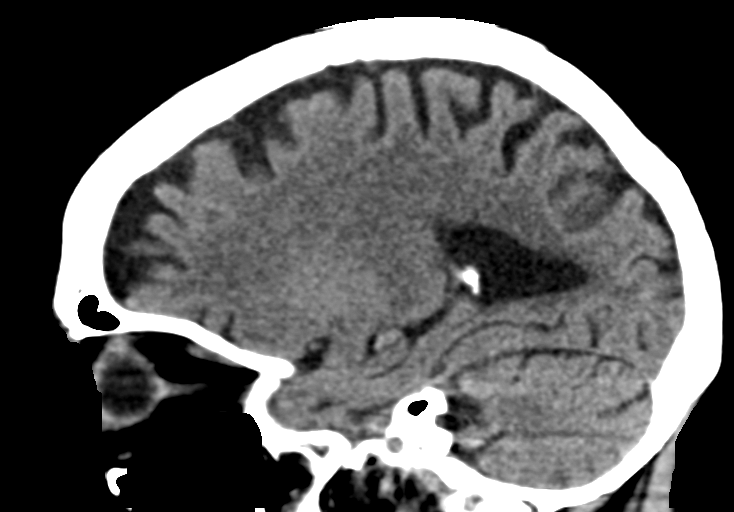

[15 of 47 positions shown; findings below may reference images not displayed]

FINDINGS: Brain: No evidence of acute infarction, hemorrhage, extra-axial
collection, ventriculomegaly, or mass effect. Generalized cerebral
atrophy. Periventricular white matter low attenuation likely
secondary to microangiopathy.

Vascular: Cerebrovascular atherosclerotic calcifications are noted.

Skull: Negative for fracture or focal lesion.

Sinuses/Orbits: Visualized portions of the orbits are unremarkable.
Visualized portions of the paranasal sinuses and mastoid air cells
are unremarkable.

Other: None.
IMPRESSION: No acute intracranial pathology.

## 2020-06-28 ENCOUNTER — Telehealth: Payer: Self-pay

## 2020-06-28 NOTE — Telephone Encounter (Signed)
Yes, will see patient as new patient in office per request.

## 2020-06-28 NOTE — Telephone Encounter (Signed)
Copied from [place] [id number]. Topic: Appointment Scheduling - Scheduling Inquiry for Clinic >> Jun 28, 2020  8:18 AM Lennox Solders wrote: Reason for CRM: PT daughter Manuela [place] welch said jolene she talked with jolene and jolene will accept her mother as new pt. Please call pt daughter Willeen Niece at work number [phone]

## 2020-07-06 ENCOUNTER — Ambulatory Visit: Payer: BLUE CROSS/BLUE SHIELD | Admitting: Nurse Practitioner

## 2020-07-07 ENCOUNTER — Encounter: Payer: Self-pay | Admitting: Nurse Practitioner

## 2020-07-07 DIAGNOSIS — Z85118 Personal history of other malignant neoplasm of bronchus and lung: Secondary | ICD-10-CM | POA: Insufficient documentation

## 2020-07-07 DIAGNOSIS — C3412 Malignant neoplasm of upper lobe, left bronchus or lung: Secondary | ICD-10-CM | POA: Insufficient documentation

## 2020-07-12 ENCOUNTER — Ambulatory Visit (INDEPENDENT_AMBULATORY_CARE_PROVIDER_SITE_OTHER): Payer: Medicare Other | Admitting: Nurse Practitioner

## 2020-07-12 ENCOUNTER — Other Ambulatory Visit: Payer: Self-pay

## 2020-07-12 ENCOUNTER — Encounter: Payer: Self-pay | Admitting: Nurse Practitioner

## 2020-07-12 VITALS — BP 107/67 | HR 75 | Temp 98.3°F | Ht 59.5 in | Wt 100.4 lb

## 2020-07-12 DIAGNOSIS — D508 Other iron deficiency anemias: Secondary | ICD-10-CM

## 2020-07-12 DIAGNOSIS — Z5181 Encounter for therapeutic drug level monitoring: Secondary | ICD-10-CM

## 2020-07-12 DIAGNOSIS — J432 Centrilobular emphysema: Secondary | ICD-10-CM

## 2020-07-12 DIAGNOSIS — E039 Hypothyroidism, unspecified: Secondary | ICD-10-CM | POA: Insufficient documentation

## 2020-07-12 DIAGNOSIS — E538 Deficiency of other specified B group vitamins: Secondary | ICD-10-CM

## 2020-07-12 DIAGNOSIS — I700 Atherosclerosis of aorta: Secondary | ICD-10-CM | POA: Diagnosis not present

## 2020-07-12 DIAGNOSIS — Z85118 Personal history of other malignant neoplasm of bronchus and lung: Secondary | ICD-10-CM

## 2020-07-12 DIAGNOSIS — F1011 Alcohol abuse, in remission: Secondary | ICD-10-CM

## 2020-07-12 DIAGNOSIS — Z1159 Encounter for screening for other viral diseases: Secondary | ICD-10-CM

## 2020-07-12 DIAGNOSIS — E559 Vitamin D deficiency, unspecified: Secondary | ICD-10-CM

## 2020-07-12 DIAGNOSIS — F17219 Nicotine dependence, cigarettes, with unspecified nicotine-induced disorders: Secondary | ICD-10-CM

## 2020-07-12 DIAGNOSIS — E440 Moderate protein-calorie malnutrition: Secondary | ICD-10-CM | POA: Diagnosis not present

## 2020-07-12 DIAGNOSIS — Z13220 Encounter for screening for lipoid disorders: Secondary | ICD-10-CM

## 2020-07-12 MED ORDER — OMEPRAZOLE 20 MG PO CPDR: 20.0000 mg | DELAYED_RELEASE_CAPSULE | Freq: Every day | ORAL | 4 refills | Status: DC

## 2020-07-12 MED ORDER — UMECLIDINIUM-VILANTEROL 62.5-25 MCG/INH IN AEPB: 1.0000 | INHALATION_SPRAY | Freq: Every day | RESPIRATORY_TRACT | 4 refills | Status: DC

## 2020-07-12 MED ORDER — MIRTAZAPINE 15 MG PO TABS: 15.0000 mg | ORAL_TABLET | Freq: Every day | ORAL | 4 refills | Status: DC

## 2020-07-12 MED ORDER — DRONABINOL 2.5 MG PO CAPS: 2.5000 mg | ORAL_CAPSULE | Freq: Two times a day (BID) | ORAL | 0 refills | Status: DC

## 2020-07-12 NOTE — Assessment & Plan Note (Signed)
Takes Dronabinol for appetite stimulant due to past lung CA and weight loss, continue regimen.  Obtain UDS today.

## 2020-07-12 NOTE — Assessment & Plan Note (Signed)
Chronic, ongoing.  Will continue current regimen at this time and adjust as needed based on labs.  TSH and Free T4 today.

## 2020-07-12 NOTE — Assessment & Plan Note (Addendum)
Chronic, ongoing with no recent PFTs in chart.  Would benefit from this and will obtain next visit.  At this time educated on benefit of maintenance inhaler and only using Albuterol as needed for SOB and wheezing.  Educated on progressive nature of emphysema and ways to slow this down, including smoking cessation.  Will start Anoro, script sent, educated on this medication and continue Albuterol as needed only.  Recommend complete cessation of smoking.  Return in 8 weeks. Check CBC today.

## 2020-07-12 NOTE — Assessment & Plan Note (Signed)
Noted on past CXR in 2017.  Recommend complete cessation of smoking.  Will obtain lipid panel today and consider adding on statin + daily Baby ASA if elevations -- for prevention.

## 2020-07-12 NOTE — Patient Instructions (Signed)
Umeclidinium; Vilanterol inhalation powder What is this medicine? UMECLIDINIUM; VILANTEROL (ue MEK li DIN ee um; vye LAN ter ol) inhalation is a combination of two medicines that decrease inflammation and help to open up the airways of your lungs. It is for chronic obstructive pulmonary disease (COPD), including chronic bronchitis or emphysema. Do NOT use for asthma or an acute asthma attack. Do NOT use for a COPD attack. This medicine may be used for other purposes; ask your health care provider or pharmacist if you have questions. COMMON BRAND NAME(S): ANORO ELLIPTA What should I tell my health care provider before I take this medicine? They need to know if you have any of these conditions:  bladder problems or difficulty passing urine  diabetes  glaucoma  heart disease or irregular heartbeat  high blood pressure  kidney disease  pheochromocytoma  prostate disease  seizures  thyroid disease  an unusual or allergic reaction to umeclidinium, vilanterol, lactose, milk proteins, other medicines, foods, dyes, or preservatives  pregnant or trying to get pregnant  breast-feeding How should I use this medicine? This medicine is inhaled through the mouth. It is used once per day. Follow the directions on the prescription label. Do not use a spacer device with this inhaler. Take your medicine at regular intervals. Do not take your medicine more often than directed. Do not stop taking except on your doctor's advice. Make sure that you are using your inhaler correctly. Ask you doctor or health care provider if you have any questions. Talk to your pediatrician regarding the use of this medicine in children. Special care may be needed. Overdosage: If you think you have taken too much of this medicine contact a poison control center or emergency room at once. NOTE: This medicine is only for you. Do not share this medicine with others. What if I miss a dose? If you miss a dose, use it as  soon as you can. If it is almost time for your next dose, use only that dose and continue with your regular schedule. Do not use double or extra doses. What may interact with this medicine? Do not take this medicine with any of the following medications:  cisapride  dofetilide  dronedarone  MAOIs like Carbex, Eldepryl, Marplan, Nardil, and Parnate  pimozide  thioridazine  ziprasidone This medicine may also interact with the following medications:  antihistamines for allergy  antiviral medicines for HIV or AIDS  atropine  beta-blockers like metoprolol and propranolol  certain medicines for bladder problems like oxybutynin, tolterodine  certain medicines for depression, anxiety, or psychotic disturbances  certain medicines for Parkinson's disease like benztropine, trihexyphenidyl  certain medicines for stomach problems like dicyclomine, hyoscyamine  certain medicines for travel sickness like scopolamine  diuretics  ipratropium  medicines for colds  medicines for fungal infections like ketoconazole and itraconazole  other medicines for breathing problems  other medicines that prolong the QT interval (cause an abnormal heart rhythm)  tiotropium This list may not describe all possible interactions. Give your health care provider a list of all the medicines, herbs, non-prescription drugs, or dietary supplements you use. Also tell them if you smoke, drink alcohol, or use illegal drugs. Some items may interact with your medicine. What should I watch for while using this medicine? Visit your doctor or health care professional for regular checkups. Tell your doctor or health care professional if your symptoms do not get better. If your symptoms get worse or if you need your short-acting inhalers more often, call your  doctor right away. Do not use this medicine more than once every 24 hours. What side effects may I notice from receiving this medicine? Side effects that you  should report to your doctor or health care professional as soon as possible:  allergic reactions like skin rash or hives, swelling of the face, lips, or tongue  breathing problems right after inhaling your medicine  changes in vision  chest pain  eye pain  fast, irregular heartbeat  feeling faint or lightheaded, falls  fever or chills  nausea, vomiting  trouble passing urine or change in the amount of urine Side effects that usually do not require medical attention (report to your doctor or health care professional if they continue or are bothersome):  constipation  cough  diarrhea  headache  muscle cramps  nervousness  sore throat  tremor This list may not describe all possible side effects. Call your doctor for medical advice about side effects. You may report side effects to FDA at 1-800-FDA-1088. Where should I keep my medicine? Keep out of the reach of children. Store at room temperature between 15 and 30 degrees C (59 and 86 degrees F). Store in a dry place away from direct heat or sunlight. Throw away 6 weeks after you remove the inhaler from the foil tray, or after the dose indicator reads 0, whichever comes first. Throw away any unopened packages after the expiration date. NOTE: This sheet is a summary. It may not cover all possible information. If you have questions about this medicine, talk to your doctor, pharmacist, or health care provider.  2020 Elsevier/Gold Standard (2018-02-02 14:47:24) Chronic Obstructive Pulmonary Disease Chronic obstructive pulmonary disease (COPD) is a long-term (chronic) lung problem. When you have COPD, it is hard for air to get in and out of your lungs. Usually the condition gets worse over time, and your lungs will never return to normal. There are things you can do to keep yourself as healthy as possible.  Your doctor may treat your condition with: ? Medicines. ? Oxygen. ? Lung surgery.  Your doctor may also  recommend: ? Rehabilitation. This includes steps to make your body work better. It may involve a team of specialists. ? Quitting smoking, if you smoke. ? Exercise and changes to your diet. ? Comfort measures (palliative care). Follow these instructions at home: Medicines  Take over-the-counter and prescription medicines only as told by your doctor.  Talk to your doctor before taking any cough or allergy medicines. You may need to avoid medicines that cause your lungs to be dry. Lifestyle  If you smoke, stop. Smoking makes the problem worse. If you need help quitting, ask your doctor.  Avoid being around things that make your breathing worse. This may include smoke, chemicals, and fumes.  Stay active, but remember to rest as well.  Learn and use tips on how to relax.  Make sure you get enough sleep. Most adults need at least 7 hours of sleep every night.  Eat healthy foods. Eat smaller meals more often. Rest before meals. Controlled breathing Learn and use tips on how to control your breathing as told by your doctor. Try:  Breathing in (inhaling) through your nose for 1 second. Then, pucker your lips and breath out (exhale) through your lips for 2 seconds.  Putting one hand on your belly (abdomen). Breathe in slowly through your nose for 1 second. Your hand on your belly should move out. Pucker your lips and breathe out slowly through your lips. Your  hand on your belly should move in as you breathe out.  Controlled coughing Learn and use controlled coughing to clear mucus from your lungs. Follow these steps: 1. Lean your head a little forward. 2. Breathe in deeply. 3. Try to hold your breath for 3 seconds. 4. Keep your mouth slightly open while coughing 2 times. 5. Spit any mucus out into a tissue. 6. Rest and do the steps again 1 or 2 times as needed. General instructions  Make sure you get all the shots (vaccines) that your doctor recommends. Ask your doctor about a flu shot  and a pneumonia shot.  Use oxygen therapy and pulmonary rehabilitation if told by your doctor. If you need home oxygen therapy, ask your doctor if you should buy a tool to measure your oxygen level (oximeter).  Make a COPD action plan with your doctor. This helps you to know what to do if you feel worse than usual.  Manage any other conditions you have as told by your doctor.  Avoid going outside when it is very hot, cold, or humid.  Avoid people who have a sickness you can catch (contagious).  Keep all follow-up visits as told by your doctor. This is important. Contact a doctor if:  You cough up more mucus than usual.  There is a change in the color or thickness of the mucus.  It is harder to breathe than usual.  Your breathing is faster than usual.  You have trouble sleeping.  You need to use your medicines more often than usual.  You have trouble doing your normal activities such as getting dressed or walking around the house. Get help right away if:  You have shortness of breath while resting.  You have shortness of breath that stops you from: ? Being able to talk. ? Doing normal activities.  Your chest hurts for longer than 5 minutes.  Your skin color is more blue than usual.  Your pulse oximeter shows that you have low oxygen for longer than 5 minutes.  You have a fever.  You feel too tired to breathe normally. Summary  Chronic obstructive pulmonary disease (COPD) is a long-term lung problem.  The way your lungs work will never return to normal. Usually the condition gets worse over time. There are things you can do to keep yourself as healthy as possible.  Take over-the-counter and prescription medicines only as told by your doctor.  If you smoke, stop. Smoking makes the problem worse. This information is not intended to replace advice given to you by your health care provider. Make sure you discuss any questions you have with your health care  provider. Document Revised: 08/01/2017 Document Reviewed: 09/23/2016 Elsevier Patient Education  2020 [employer].

## 2020-07-12 NOTE — Assessment & Plan Note (Signed)
[facility] oncology follows -- presented to upper left lobe in 2019 -- had partial lobectomy and has every 6 months scans.  Continue this collaboration.

## 2020-07-12 NOTE — Progress Notes (Signed)
New Patient Office Visit  Subjective:  Patient ID: Lori Rangel, female    DOB: 04/15/1961  Age: 59 y.o. MRN: [id number]  CC:  Chief Complaint  Patient presents with  . Establish Care    pt here to establish care, would like to get labs done today     HPI Lori Rangel  presents for new patient visit to establish care.  Introduced to Designer, jewellery role and practice setting.  All questions answered.  Discussed provider/patient relationship and expectations.  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  Does have history of thoracic aortic atherosclerosis noted on imaging.  She goes for scans every 6 months.  Has been two years cancer free -- took out part of her upper lobe left side.  Is a current every day smoker -- smokes < 1/2 PPD -- not interested in quitting at this time.  Used to smoke 1 1/2 PPD.  Started smoking at age 79.  Only uses Albuterol at this time -- uses in morning and at night.  Did use Symbicort in past.    Has history of alcohol abuse, but has not used in several years.  She chose smoking over continue alcohol use. COPD status: stable Satisfied with current treatment?: yes Oxygen use: no Dyspnea frequency: none Cough frequency: none Rescue inhaler frequency:  2 times a day Limitation of activity: no Productive cough:  none Last Spirometry: unknown Pneumovax: unknown Influenza: unknown   HYPOTHYROIDISM  Takes her Levothyroxine 25 MCG. Takes Mirtazapine due to past weight loss, continues on this -- she also take Dronabinol for this twice a day.  She is aware Dronabinol is a controlled substance and will require yearly drug screen and controlled substance contract, which she is agreeable to.  This has been used due to her past CA and weight loss -- aides in maintaining weight and appetite. Thyroid control status:stable Satisfied with current treatment? yes Medication side effects: no Medication compliance: good  compliance Etiology of hypothyroidism:  Recent dose adjustment:no Fatigue: no Cold intolerance: yes Heat intolerance: no Weight gain: no Weight loss: yes Constipation: no Diarrhea/loose stools: no Palpitations: no Lower extremity edema: no Anxiety/depressed mood: no  Past Medical History:  Diagnosis Date  . Asthma   . Cancer ([place])    lung  . COPD (chronic obstructive pulmonary disease) ([place])   . Shortness of breath dyspnea   . Weight decrease major recent weight loss    Past Surgical History:  Procedure Laterality Date  . BREAST BIOPSY Right   . COLONOSCOPY    . ELECTROMAGNETIC NAVIGATION BROCHOSCOPY N/A 01/02/2016   Procedure: ELECTROMAGNETIC NAVIGATION BRONCHOSCOPY;  Surgeon: Flora Lipps, MD;  Location: [facility] ORS;  Service: Cardiopulmonary;  Laterality: N/A;  . ESOPHAGOGASTRODUODENOSCOPY (EGD) WITH PROPOFOL N/A 08/27/2018   Procedure: ESOPHAGOGASTRODUODENOSCOPY (EGD) WITH PROPOFOL;  Surgeon: Lucilla Lame, MD;  Location: [facility] ENDOSCOPY;  Service: Endoscopy;  Laterality: N/A;  . INTRAMEDULLARY (IM) NAIL INTERTROCHANTERIC Right 11/03/2017   Procedure: INTRAMEDULLARY (IM) NAIL INTERTROCHANTRIC;  Surgeon: Leim Fabry, MD;  Location: [facility] ORS;  Service: Orthopedics;  Laterality: Right;    Family History  Problem Relation Age of Onset  . Cirrhosis Father     Social History   Socioeconomic History  . Marital status: Legally Separated    Spouse name: Not on file  . Number of children: Not on file  . Years of education: Not on file  . Highest education level: Not on file  Occupational History  .  Not on file  Tobacco Use  . Smoking status: Current Every Day Smoker    Packs/day: 0.25    Years: 40.00    Pack years: 10.00    Types: Cigarettes  . Smokeless tobacco: Never Used  Vaping Use  . Vaping Use: Former  . Substances: Nicotine  Substance and Sexual Activity  . Alcohol use: Not Currently    Alcohol/week: 0.0 standard drinks  . Drug use: No  . Sexual activity:  Never  Other Topics Concern  . Not on file  Social History Narrative  . Not on file   Social Determinants of Health   Financial Resource Strain:   . Difficulty of Paying Living Expenses: Not on file  Food Insecurity:   . Worried About Charity fundraiser in the Last Year: Not on file  . Ran Out of Food in the Last Year: Not on file  Transportation Needs:   . Lack of Transportation (Medical): Not on file  . Lack of Transportation (Non-Medical): Not on file  Physical Activity:   . Days of Exercise per Week: Not on file  . Minutes of Exercise per Session: Not on file  Stress:   . Feeling of Stress : Not on file  Social Connections:   . Frequency of Communication with Friends and Family: Not on file  . Frequency of Social Gatherings with Friends and Family: Not on file  . Attends Religious Services: Not on file  . Active Member of Clubs or Organizations: Not on file  . Attends Archivist Meetings: Not on file  . Marital Status: Not on file  Intimate Partner Violence:   . Fear of Current or Ex-Partner: Not on file  . Emotionally Abused: Not on file  . Physically Abused: Not on file  . Sexually Abused: Not on file    ROS Review of Systems  Constitutional: Negative for activity change, appetite change, diaphoresis, fatigue and fever.  Respiratory: Negative for cough, chest tightness, shortness of breath and wheezing.   Cardiovascular: Negative for chest pain, palpitations and leg swelling.  Gastrointestinal: Negative.   Endocrine: Negative for cold intolerance, heat intolerance, polydipsia, polyphagia and polyuria.  Neurological: Negative.   Psychiatric/Behavioral: Negative.     Objective:   Today's Vitals: BP 107/67   Pulse 75   Temp 98.3 F (36.8 C)   Ht 4' 11.5" (1.511 m)   Wt 100 lb 6.4 oz (45.5 kg)   SpO2 96%   BMI 19.94 kg/m   Physical Exam Vitals and nursing note reviewed.  Constitutional:      General: She is awake. She is not in acute  distress.    Appearance: She is well-developed, well-groomed and underweight. She is not ill-appearing.     Comments: Frail appearing, using walker for mobility.  HENT:     Head: Normocephalic.     Right Ear: Hearing normal.     Left Ear: Hearing normal.  Eyes:     General: Lids are normal.        Right eye: No discharge.        Left eye: No discharge.     Conjunctiva/sclera: Conjunctivae normal.     Pupils: Pupils are equal, round, and reactive to light.  Neck:     Thyroid: No thyromegaly.     Vascular: No carotid bruit.  Cardiovascular:     Rate and Rhythm: Normal rate and regular rhythm.     Heart sounds: Normal heart sounds. No murmur heard.  No gallop.  Pulmonary:     Effort: Pulmonary effort is normal. No accessory muscle usage or respiratory distress.     Breath sounds: Decreased breath sounds present.     Comments: Diminished throughout with occasional expiratory wheezing noted. Abdominal:     General: Bowel sounds are normal.     Palpations: Abdomen is soft.  Musculoskeletal:     Cervical back: Normal range of motion and neck supple.     Right lower leg: No edema.     Left lower leg: No edema.  Lymphadenopathy:     Cervical: No cervical adenopathy.  Skin:    General: Skin is warm and dry.  Neurological:     Mental Status: She is alert and oriented to person, place, and time.  Psychiatric:        Attention and Perception: Attention normal.        Mood and Affect: Mood normal.        Speech: Speech normal.        Behavior: Behavior normal. Behavior is cooperative.        Thought Content: Thought content normal.    Assessment & Plan:   Problem List Items Addressed This Visit      Cardiovascular and Mediastinum   Thoracic aortic atherosclerosis ([place])    Noted on past CXR in 2017.  Recommend complete cessation of smoking.  Will obtain lipid panel today and consider adding on statin + daily Baby ASA if elevations -- for prevention.        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  Would benefit from this and will obtain next visit.  At this time educated on benefit of maintenance inhaler and only using Albuterol as needed for SOB and wheezing.  Educated on progressive nature of emphysema and ways to slow this down, including smoking cessation.  Will start Anoro, script sent, educated on this medication and continue Albuterol as needed only.  Recommend complete cessation of smoking.  Return in 8 weeks. Check CBC today.      Relevant Medications   umeclidinium-vilanterol (ANORO ELLIPTA) 62.5-25 MCG/INH AEPB     Endocrine   Hypothyroidism    Chronic, ongoing.  Will continue current regimen at this time and adjust as needed based on labs.  TSH and Free T4 today.      Relevant Orders   TSH   T4, free     Nervous and Auditory   Nicotine dependence, cigarettes, w unsp disorders    I have recommended complete cessation of tobacco use. I have discussed various options available for assistance with tobacco cessation including over the counter methods (Nicotine gum, patch and lozenges). We also discussed prescription options (Chantix, Nicotine Inhaler / Nasal Spray). The patient is not interested in pursuing any prescription tobacco cessation options at this time.         Other   Moderate protein-calorie malnutrition ([place])    Ongoing issue, post lung CA.  Will continue Mirtazapine and Dronabinol, as currently prescribed.  This is offering benefit to her appetite and maintaining weight.  She is aware Dronabinol is a controlled substance and will require yearly drug screen and controlled substance contract, which she is agreeable to. UDS today.      Relevant Orders   VITAMIN D 25 Hydroxy (Vit-D Deficiency, Fractures)   History of alcohol abuse    Recommend continued cessation of alcohol use.  Check CMP today.  Relevant Orders   Comprehensive metabolic panel   History of lung cancer     [facility] oncology follows -- presented to upper left lobe in 2019 -- had partial lobectomy and has every 6 months scans.  Continue this collaboration.      Iron deficiency anemia secondary to inadequate dietary iron intake    Ongoing, continues on daily iron and Vitamin C.  Will continue this regimen and obtain labs today to include CBC, iron, ferritin, and B12.      Relevant Medications   ferrous sulfate (FEROSUL) 325 (65 FE) MG tablet   Other Relevant Orders   CBC with Differential/Platelet   Iron, TIBC and Ferritin Panel   Vitamin B12   Medication monitoring encounter    Takes Dronabinol for appetite stimulant due to past lung CA and weight loss, continue regimen.  Obtain UDS today.      Relevant Orders   [id number] 11+Oxyco+Alc+Crt-Bund    Other Visit Diagnoses    Vitamin D deficiency       History of low levels, check Vit D today and start supplement as needed.   Relevant Orders   VITAMIN D 25 Hydroxy (Vit-D Deficiency, Fractures)   Vitamin B12 deficiency       History of low levels, check level today and start supplement as needed.   Relevant Orders   Vitamin B12   Screening cholesterol level       Lipid panel check today, consider medication if elevations.   Relevant Orders   Lipid Panel w/o Chol/HDL Ratio   Need for hepatitis C screening test       Hep C screening on labs today   Relevant Orders   Hepatitis C antibody      Outpatient Encounter Medications as of 07/12/2020  Medication Sig  . aspirin EC 81 MG tablet Take 81 mg by mouth daily.  . cholecalciferol (VITAMIN D3) 25 MCG (1000 UNIT) tablet Take 1,000 Units by mouth daily.  Marland Kitchen dronabinol (MARINOL) 2.5 MG capsule Take 2.5 mg by mouth 2 (two) times daily before a meal.  . ferrous sulfate (FEROSUL) 325 (65 FE) MG tablet Take 325 mg by mouth daily with breakfast.  . levothyroxine (SYNTHROID, LEVOTHROID) 25 MCG tablet Take 1 tablet (25 mcg total) by mouth daily before breakfast.  . mirtazapine (REMERON) 15 MG tablet  Take 1 tablet (15 mg total) by mouth at bedtime.  . Multiple Vitamin (MULTIVITAMIN WITH MINERALS) TABS tablet Take 1 tablet by mouth daily.  Marland Kitchen omeprazole (PRILOSEC) 20 MG capsule Take 1 capsule (20 mg total) by mouth daily.  . VENTOLIN HFA 108 (90 Base) MCG/ACT inhaler INHALE 2 PUFFS INTO LUNGS EVERY 6 HOURS AS NEEDED FOR WHEEZING OR SHORTNESS OF BREATH (Patient taking differently: Inhale 2 puffs into the lungs every 6 (six) hours as needed for wheezing or shortness of breath. )  . vitamin C (VITAMIN C) 250 MG tablet Take 1 tablet (250 mg total) by mouth 2 (two) times daily.  . [DISCONTINUED] mirtazapine (REMERON) 15 MG tablet Take 15 mg by mouth at bedtime.  . [DISCONTINUED] omeprazole (PRILOSEC) 20 MG capsule Take 20 mg by mouth daily.  Marland Kitchen acetaminophen (TYLENOL) 500 MG tablet Take 500-1,000 mg by mouth every 8 (eight) hours as needed for mild constipation or moderate pain. (Patient not taking: Reported on 07/12/2020)  . docusate sodium (COLACE) 100 MG capsule Take 1 capsule (100 mg total) by mouth 2 (two) times daily. (Patient not taking: Reported on 07/12/2020)  . umeclidinium-vilanterol (ANORO ELLIPTA) 62.5-25  MCG/INH AEPB Inhale 1 puff into the lungs daily.  . [DISCONTINUED] budesonide-formoterol (SYMBICORT) 160-4.5 MCG/ACT inhaler Inhale 2 puffs into the lungs 2 (two) times daily.  . [DISCONTINUED] folic acid (FOLVITE) 1 MG tablet Take 1 tablet (1 mg total) by mouth daily.  . [DISCONTINUED] methocarbamol (ROBAXIN) 500 MG tablet Take 1 tablet (500 mg total) by mouth every 6 (six) hours as needed for muscle spasms. (Patient not taking: Reported on 07/12/2020)  . [DISCONTINUED] ondansetron (ZOFRAN) 4 MG tablet Take 1 tablet (4 mg total) by mouth every 6 (six) hours as needed for nausea.  . [DISCONTINUED] oxyCODONE (OXY IR/ROXICODONE) 5 MG immediate release tablet Take 1-2 tablets (5-10 mg total) by mouth every 4 (four) hours as needed for moderate pain (pain score 4-6).  . [DISCONTINUED] protein  supplement shake (PREMIER PROTEIN) LIQD Take 325 mLs (11 oz total) by mouth 2 (two) times daily between meals. Mixed with ice cream  . [DISCONTINUED] thiamine 100 MG tablet Take 1 tablet (100 mg total) by mouth daily.   No facility-administered encounter medications on file as of 07/12/2020.    Follow-up: Return in about 8 weeks (around 09/06/2020) for COPD -- need spirometry.   Venita Lick, NP

## 2020-07-12 NOTE — Assessment & Plan Note (Signed)
Recommend continued cessation of alcohol use.  Check CMP today.

## 2020-07-12 NOTE — Assessment & Plan Note (Signed)
Ongoing, continues on daily iron and Vitamin C.  Will continue this regimen and obtain labs today to include CBC, iron, ferritin, and B12.

## 2020-07-12 NOTE — Assessment & Plan Note (Signed)
Ongoing issue, post lung CA.  Will continue Mirtazapine and Dronabinol, as currently prescribed.  This is offering benefit to her appetite and maintaining weight.  She is aware Dronabinol is a controlled substance and will require yearly drug screen and controlled substance contract, which she is agreeable to. UDS today.

## 2020-07-12 NOTE — Assessment & Plan Note (Signed)
I have recommended complete cessation of tobacco use. I have discussed various options available for assistance with tobacco cessation including over the counter methods (Nicotine gum, patch and lozenges). We also discussed prescription options (Chantix, Nicotine Inhaler / Nasal Spray). The patient is not interested in pursuing any prescription tobacco cessation options at this time.  

## 2020-07-13 LAB — T4, FREE: Free T4: 1.11 ng/dL (ref 0.82–1.77)

## 2020-07-13 LAB — TSH: TSH: 0.657 u[IU]/mL (ref 0.450–4.500)

## 2020-07-13 LAB — COMPREHENSIVE METABOLIC PANEL
ALT: 17 IU/L (ref 0–32)
AST: 20 IU/L (ref 0–40)
Albumin/Globulin Ratio: 1.6 (ref 1.2–2.2)
Albumin: 4.2 g/dL (ref 3.8–4.9)
Alkaline Phosphatase: 161 IU/L — ABNORMAL HIGH (ref 44–121)
BUN/Creatinine Ratio: 5 — ABNORMAL LOW (ref 12–28)
BUN: 3 mg/dL — ABNORMAL LOW (ref 8–27)
Bilirubin Total: <0.2 mg/dL (ref 0.0–1.2)
CO2: 24 mmol/L (ref 20–29)
Calcium: 9.1 mg/dL (ref 8.7–10.3)
Chloride: 94 mmol/L — ABNORMAL LOW (ref 96–106)
Creatinine, Ser: 0.6 mg/dL (ref 0.57–1.00)
GFR calc Af Amer: 115 mL/min/{1.73_m2} (ref >59)
GFR calc non Af Amer: 99 mL/min/{1.73_m2} (ref >59)
Globulin, Total: 2.6 g/dL (ref 1.5–4.5)
Glucose: 89 mg/dL (ref 65–99)
Potassium: 4.3 mmol/L (ref 3.5–5.2)
Sodium: 133 mmol/L — ABNORMAL LOW (ref 134–144)
Total Protein: 6.8 g/dL (ref 6.0–8.5)

## 2020-07-13 LAB — CBC WITH DIFFERENTIAL/PLATELET
Basophils Absolute: 0.1 10*3/uL (ref 0.0–0.2)
Basos: 1 %
EOS (ABSOLUTE): 0.1 10*3/uL (ref 0.0–0.4)
Eos: 2 %
Hematocrit: 40.6 % (ref 34.0–46.6)
Hemoglobin: 13.6 g/dL (ref 11.1–15.9)
Immature Grans (Abs): 0 10*3/uL (ref 0.0–0.1)
Immature Granulocytes: 0 %
Lymphocytes Absolute: 2 10*3/uL (ref 0.7–3.1)
Lymphs: 29 %
MCH: 32.2 pg (ref 26.6–33.0)
MCHC: 33.5 g/dL (ref 31.5–35.7)
MCV: 96 fL (ref 79–97)
Monocytes Absolute: 0.3 10*3/uL (ref 0.1–0.9)
Monocytes: 5 %
Neutrophils Absolute: 4.4 10*3/uL (ref 1.4–7.0)
Neutrophils: 63 %
Platelets: 392 10*3/uL (ref 150–450)
RBC: 4.23 x10E6/uL (ref 3.77–5.28)
RDW: 11.8 % (ref 11.7–15.4)
WBC: 7 10*3/uL (ref 3.4–10.8)

## 2020-07-13 LAB — LIPID PANEL W/O CHOL/HDL RATIO
Cholesterol, Total: 238 mg/dL — ABNORMAL HIGH (ref 100–199)
HDL: 48 mg/dL (ref >39)
LDL Chol Calc (NIH): 160 mg/dL — ABNORMAL HIGH (ref 0–99)
Triglycerides: 166 mg/dL — ABNORMAL HIGH (ref 0–149)
VLDL Cholesterol Cal: 30 mg/dL (ref 5–40)

## 2020-07-13 LAB — IRON,TIBC AND FERRITIN PANEL
Ferritin: 674 ng/mL — ABNORMAL HIGH (ref 15–150)
Iron Saturation: 22 % (ref 15–55)
Iron: 50 ug/dL (ref 27–159)
Total Iron Binding Capacity: 231 ug/dL — ABNORMAL LOW (ref 250–450)
UIBC: 181 ug/dL (ref 131–425)

## 2020-07-13 LAB — VITAMIN D 25 HYDROXY (VIT D DEFICIENCY, FRACTURES): Vit D, 25-Hydroxy: 44.4 ng/mL (ref 30.0–100.0)

## 2020-07-13 LAB — HEPATITIS C ANTIBODY: Hep C Virus Ab: <0.1 s/co ratio (ref 0.0–0.9)

## 2020-07-13 LAB — VITAMIN B12: Vitamin B-12: >2000 pg/mL — ABNORMAL HIGH (ref 232–1245)

## 2020-07-13 NOTE — Progress Notes (Signed)
Good morning, please let Lori Rangel know her labs have returned.  Currently her anemia has improved, I would continue iron supplement daily, along with her Vitamin D as this level is also normal with supplement on board.  Kidney and liver function are normal.  Sodium, salt, was a little low.  I recommend she add just a little salt to diet daily and we will recheck this next visit.  Hep C negative.  Thyroid levels normal, can continue current Levothyroxine dosing.  Cholesterol levels are high, have you ever taken a statin to help lower these?  If not I recommend we start one to help prevent stroke or heart attack.  If you would like to try this, please let me know and I will send in a low dose to start.  Any questions? Keep being awesome!!  Thank you for allowing me to participate in your care. Kindest regards, Aadan Chenier

## 2020-07-14 ENCOUNTER — Other Ambulatory Visit: Payer: Self-pay | Admitting: Nurse Practitioner

## 2020-07-14 MED ORDER — ROSUVASTATIN CALCIUM 10 MG PO TABS: 10.0000 mg | ORAL_TABLET | Freq: Every day | ORAL | 4 refills | Status: DC

## 2020-07-18 LAB — DRUG SCREEN 764883 11+OXYCO+ALC+CRT-BUND
Amphetamines, Urine: NEGATIVE ng/mL
BENZODIAZ UR QL: NEGATIVE ng/mL
Barbiturate: NEGATIVE ng/mL
Cocaine (Metabolite): NEGATIVE ng/mL
Creatinine: 17.7 mg/dL — ABNORMAL LOW (ref 20.0–300.0)
Ethanol: NEGATIVE %
Meperidine: NEGATIVE ng/mL
Methadone Screen, Urine: NEGATIVE ng/mL
OPIATE SCREEN URINE: NEGATIVE ng/mL
Oxycodone/Oxymorphone, Urine: NEGATIVE ng/mL
Phencyclidine: NEGATIVE ng/mL
Propoxyphene: NEGATIVE ng/mL
Tramadol: NEGATIVE ng/mL
pH, Urine: 7.6 (ref 4.5–8.9)

## 2020-07-18 LAB — CANNABINOID CONFIRMATION, UR
CANNABINOIDS: POSITIVE — AB
Carboxy THC GC/MS Conf: 43 ng/mL

## 2020-07-18 LAB — SPECIFIC GRAVITY: Specific Gravity: 1.0025

## 2020-08-04 ENCOUNTER — Ambulatory Visit (INDEPENDENT_AMBULATORY_CARE_PROVIDER_SITE_OTHER): Payer: Medicare Other

## 2020-08-04 VITALS — Ht 59.0 in | Wt 100.9 lb

## 2020-08-04 DIAGNOSIS — Z0000 Encounter for general adult medical examination without abnormal findings: Secondary | ICD-10-CM | POA: Diagnosis not present

## 2020-08-04 NOTE — Progress Notes (Signed)
I connected with Lori Rangel today by telephone and verified that I am speaking with the correct person using two identifiers. Location patient: home Location provider: work Persons participating in the virtual visit: Lori Rangel, Glenna Durand LPN.   I discussed the limitations, risks, security and privacy concerns of performing an evaluation and management service by telephone and the availability of in person appointments. I also discussed with the patient that there may be a patient responsible charge related to this service. The patient expressed understanding and verbally consented to this telephonic visit.    Interactive audio and video telecommunications were attempted between this provider and patient, however failed, due to patient having technical difficulties OR patient did not have access to video capability.  We continued and completed visit with audio only.     Vital signs may be patient reported or missing.  Subjective:   Lori Rangel is a 59 y.o. female who presents for an Initial Medicare Annual Wellness Visit.  Review of Systems     Cardiac Risk Factors include: sedentary lifestyle;smoking/ tobacco exposure     Objective:    Today's Vitals   08/04/20 0941  Weight: 100 lb 14.4 oz (45.8 kg)  Height: 4\' 11"  (1.499 m)   Body mass index is 20.38 kg/m.  Advanced Directives 08/04/2020 08/27/2018 08/25/2018 08/25/2018 11/03/2017 11/03/2017 01/02/2016  Does Patient Have a Medical Advance Directive? Yes Yes Yes No Yes Yes No  Type of Paramedic of [place];Living will [place];Living will [place];Living will - Living will [place];Living will -  Does patient want to make changes to medical advance directive? - - No - Patient declined - No - Patient declined - -  Copy of [place] in Chart? No - copy requested No - copy requested No - copy requested - No - copy  requested No - copy requested -  Would patient like information on creating a medical advance directive? - No - Patient declined No - Patient declined No - Patient declined No - Patient declined No - Patient declined No - patient declined information    Current Medications (verified) Outpatient Encounter Medications as of 08/04/2020  Medication Sig  . cholecalciferol (VITAMIN D3) 25 MCG (1000 UNIT) tablet Take 1,000 Units by mouth daily.  Marland Kitchen dronabinol (MARINOL) 2.5 MG capsule Take 1 capsule (2.5 mg total) by mouth 2 (two) times daily before a meal.  . ferrous sulfate (FEROSUL) 325 (65 FE) MG tablet Take 325 mg by mouth daily with breakfast.  . levothyroxine (SYNTHROID, LEVOTHROID) 25 MCG tablet Take 1 tablet (25 mcg total) by mouth daily before breakfast.  . mirtazapine (REMERON) 15 MG tablet Take 1 tablet (15 mg total) by mouth at bedtime.  . Multiple Vitamin (MULTIVITAMIN WITH MINERALS) TABS tablet Take 1 tablet by mouth daily.  Marland Kitchen omeprazole (PRILOSEC) 20 MG capsule Take 1 capsule (20 mg total) by mouth daily.  . rosuvastatin (CRESTOR) 10 MG tablet Take 1 tablet (10 mg total) by mouth daily.  Marland Kitchen umeclidinium-vilanterol (ANORO ELLIPTA) 62.5-25 MCG/INH AEPB Inhale 1 puff into the lungs daily.  . VENTOLIN HFA 108 (90 Base) MCG/ACT inhaler INHALE 2 PUFFS INTO LUNGS EVERY 6 HOURS AS NEEDED FOR WHEEZING OR SHORTNESS OF BREATH (Patient taking differently: Inhale 2 puffs into the lungs every 6 (six) hours as needed for wheezing or shortness of breath. )  . vitamin C (VITAMIN C) 250 MG tablet Take 1 tablet (250 mg total) by mouth  2 (two) times daily.  Marland Kitchen acetaminophen (TYLENOL) 500 MG tablet Take 500-1,000 mg by mouth every 8 (eight) hours as needed for mild constipation or moderate pain. (Patient not taking: Reported on 07/12/2020)  . aspirin EC 81 MG tablet Take 81 mg by mouth daily. (Patient not taking: Reported on 08/04/2020)  . docusate sodium (COLACE) 100 MG capsule Take 1 capsule (100 mg total) by  mouth 2 (two) times daily. (Patient not taking: Reported on 07/12/2020)   No facility-administered encounter medications on file as of 08/04/2020.    Allergies (verified) Levaquin [levofloxacin]   History: Past Medical History:  Diagnosis Date  . Asthma   . Cancer ([place])    lung  . COPD (chronic obstructive pulmonary disease) ([place])   . Shortness of breath dyspnea   . Weight decrease major recent weight loss   Past Surgical History:  Procedure Laterality Date  . BREAST BIOPSY Right   . COLONOSCOPY    . ELECTROMAGNETIC NAVIGATION BROCHOSCOPY N/A 01/02/2016   Procedure: ELECTROMAGNETIC NAVIGATION BRONCHOSCOPY;  Surgeon: Flora Lipps, MD;  Location: [facility] ORS;  Service: Cardiopulmonary;  Laterality: N/A;  . ESOPHAGOGASTRODUODENOSCOPY (EGD) WITH PROPOFOL N/A 08/27/2018   Procedure: ESOPHAGOGASTRODUODENOSCOPY (EGD) WITH PROPOFOL;  Surgeon: Lucilla Lame, MD;  Location: [facility] ENDOSCOPY;  Service: Endoscopy;  Laterality: N/A;  . INTRAMEDULLARY (IM) NAIL INTERTROCHANTERIC Right 11/03/2017   Procedure: INTRAMEDULLARY (IM) NAIL INTERTROCHANTRIC;  Surgeon: Leim Fabry, MD;  Location: [facility] ORS;  Service: Orthopedics;  Laterality: Right;   Family History  Problem Relation Age of Onset  . Cirrhosis Father    Social History   Socioeconomic History  . Marital status: Widowed    Spouse name: Not on file  . Number of children: Not on file  . Years of education: Not on file  . Highest education level: Not on file  Occupational History  . Occupation: disability  Tobacco Use  . Smoking status: Current Every Day Smoker    Packs/day: 0.50    Years: 40.00    Pack years: 20.00    Types: Cigarettes  . Smokeless tobacco: Never Used  Vaping Use  . Vaping Use: Former  . Substances: Nicotine  Substance and Sexual Activity  . Alcohol use: Not Currently    Alcohol/week: 0.0 standard drinks  . Drug use: No  . Sexual activity: Not Currently  Other Topics Concern  . Not on file  Social History  Narrative  . Not on file   Social Determinants of Health   Financial Resource Strain: Low Risk   . Difficulty of Paying Living Expenses: Not hard at all  Food Insecurity: No Food Insecurity  . Worried About Charity fundraiser in the Last Year: Never true  . Ran Out of Food in the Last Year: Never true  Transportation Needs: No Transportation Needs  . Lack of Transportation (Medical): No  . Lack of Transportation (Non-Medical): No  Physical Activity: Inactive  . Days of Exercise per Week: 0 days  . Minutes of Exercise per Session: 0 min  Stress: No Stress Concern Present  . Feeling of Stress : Not at all  Social Connections:   . Frequency of Communication with Friends and Family: Not on file  . Frequency of Social Gatherings with Friends and Family: Not on file  . Attends Religious Services: Not on file  . Active Member of Clubs or Organizations: Not on file  . Attends Archivist Meetings: Not on file  . Marital Status: Not on file    Tobacco Counseling  Ready to quit: No Counseling given: Not Answered   Clinical Intake:  Pre-visit preparation completed: Yes  Pain : No/denies pain     Nutritional Status: BMI of 19-24  Normal Nutritional Risks: None Diabetes: No  How often do you need to have someone help you when you read instructions, pamphlets, or other written materials from your doctor or pharmacy?: 1 - Never What is the last grade level you completed in school?: GED  Diabetic? no  Interpreter Needed?: No  Information entered by :: NAllen LPN   Activities of Daily Living In your present state of health, do you have any difficulty performing the following activities: 08/04/2020 07/12/2020  Hearing? N N  Vision? N N  Difficulty concentrating or making decisions? N N  Walking or climbing stairs? Y N  Comment a little -  Dressing or bathing? N Y  Doing errands, shopping? Tempie Donning  Comment daughter goes with -  Conservation officer, nature and eating ? N -  Using  the Toilet? N -  In the past six months, have you accidently leaked urine? N -  Do you have problems with loss of bowel control? N -  Managing your Medications? Y -  Comment daughter sets up -  Managing your Finances? N -  Housekeeping or managing your Housekeeping? N -  Some recent data might be hidden    Patient Care Team: Venita Lick, NP as PCP - General (Nurse Practitioner) Minna Merritts, MD as Consulting Physician (Cardiology)  Indicate any recent Medical Services you may have received from other than [facility] providers in the past year (date may be approximate).     Assessment:   This is a routine wellness examination for [facility].  Hearing/Vision screen  Hearing Screening   125Hz  250Hz  500Hz  1000Hz  2000Hz  3000Hz  4000Hz  6000Hz  8000Hz   Right ear:           Left ear:           Vision Screening Comments: No regular eye exams, none  Dietary issues and exercise activities discussed: Current Exercise Habits: The patient does not participate in regular exercise at present  Goals    . Patient Stated     08/04/2020, wants to stop needing the walker      Depression Screen PHQ 2/9 Scores 08/04/2020 07/12/2020  PHQ - 2 Score 0 0    Fall Risk Fall Risk  08/04/2020  Falls in the past year? 0  Risk for fall due to : Impaired mobility;Medication side effect  Follow up Falls evaluation completed;Education provided;Falls prevention discussed    Any stairs in or around the home? No  If so, are there any without handrails? n/a Home free of loose throw rugs in walkways, pet beds, electrical cords, etc? Yes  Adequate lighting in your home to reduce risk of falls? Yes   ASSISTIVE DEVICES UTILIZED TO PREVENT FALLS:  Life alert? No  Use of a cane, walker or w/c? Yes  Grab bars in the bathroom? No  Shower chair or bench in shower? Yes  Elevated toilet seat or a handicapped toilet? No   TIMED UP AND GO:  Was the test performed? No .  Cognitive Function:     6CIT Screen  08/04/2020  What Year? 0 points  What month? 0 points  What time? 0 points  Count back from 20 0 points  Months in reverse 4 points  Repeat phrase 6 points  Total Score 10    Immunizations Immunization History  Administered Date(s)  Administered  . Influenza Split 07/04/2015  . Influenza,inj,Quad PF,6+ Mos 06/03/2018, 06/15/2019  . Influenza-Unspecified 06/06/2016, 06/21/2020  . Moderna SARS-COVID-2 Vaccination 05/27/2020, 06/24/2020    TDAP status: Due, Education has been provided regarding the importance of this vaccine. Advised may receive this vaccine at local pharmacy or Health Dept. Aware to provide a copy of the vaccination record if obtained from local pharmacy or Health Dept. Verbalized acceptance and understanding. Flu Vaccine status: Up to date Pneumococcal vaccine status: Up to date Covid-19 vaccine status: Completed vaccines  Qualifies for Shingles Vaccine? Yes   Zostavax completed No   Shingrix Completed?: No.    Education has been provided regarding the importance of this vaccine. Patient has been advised to call insurance company to determine out of pocket expense if they have not yet received this vaccine. Advised may also receive vaccine at local pharmacy or Health Dept. Verbalized acceptance and understanding.  Screening Tests Health Maintenance  Topic Date Due  . TETANUS/TDAP  Never done  . PAP SMEAR-Modifier  Never done  . MAMMOGRAM  Never done  . COLONOSCOPY  Never done  . INFLUENZA VACCINE  Completed  . COVID-19 Vaccine  Completed  . Hepatitis C Screening  Completed  . HIV Screening  Completed    Health Maintenance  Health Maintenance Due  Topic Date Due  . TETANUS/TDAP  Never done  . PAP SMEAR-Modifier  Never done  . MAMMOGRAM  Never done  . COLONOSCOPY  Never done    Colorectal cancer screening: decline Mammogram status: decline Bone Density status: n/a  Lung Cancer Screening: (Low Dose CT Chest recommended if Age 34-80 years, 30 pack-year  currently smoking OR have quit w/in 15years.) does not qualify.   Lung Cancer Screening Referral: no  Additional Screening:  Hepatitis C Screening: does qualify; Completed 07/12/2020  Vision Screening: Recommended annual ophthalmology exams for early detection of glaucoma and other disorders of the eye. Is the patient up to date with their annual eye exam?  No  Who is the provider or what is the name of the office in which the patient attends annual eye exams? none If pt is not established with a provider, would they like to be referred to a provider to establish care? No .   Dental Screening: Recommended annual dental exams for proper oral hygiene  Community Resource Referral / Chronic Care Management: CRR required this visit?  No   CCM required this visit?  No      Plan:     I have personally reviewed and noted the following in the patient's chart:   . Medical and social history . Use of alcohol, tobacco or illicit drugs  . Current medications and supplements . Functional ability and status . Nutritional status . Physical activity . Advanced directives . List of other physicians . Hospitalizations, surgeries, and ER visits in previous 12 months . Vitals . Screenings to include cognitive, depression, and falls . Referrals and appointments  In addition, I have reviewed and discussed with patient certain preventive protocols, quality metrics, and best practice recommendations. A written personalized care plan for preventive services as well as general preventive health recommendations were provided to patient.     Kellie Simmering, LPN   [id number]/01/[id number]   Nurse Notes:

## 2020-08-04 NOTE — Patient Instructions (Signed)
Lori Rangel , Thank you for taking time to come for your Medicare Wellness Visit. I appreciate your ongoing commitment to your health goals. Please review the following plan we discussed and let me know if I can assist you in the future.   Screening recommendations/referrals: Colonoscopy: decline Mammogram: decline Bone Density: n/a Recommended yearly ophthalmology/optometry visit for glaucoma screening and checkup Recommended yearly dental visit for hygiene and checkup  Vaccinations: Influenza vaccine: completed 06/21/2020 Pneumococcal vaccine: n/a Tdap vaccine: due Shingles vaccine:  discussed  Covid-19:  06/24/2020, 05/27/2020  Advanced directives: Please bring a copy of your POA (Power of Attorney) and/or Living Will to your next appointment.   Conditions/risks identified: smoking  Next appointment: Follow up in one year for your annual wellness visit.   Preventive Care 40-64 Years, Female Preventive care refers to lifestyle choices and visits with your health care provider that can promote health and wellness. What does preventive care include?  A yearly physical exam. This is also called an annual well check.  Dental exams once or twice a year.  Routine eye exams. Ask your health care provider how often you should have your eyes checked.  Personal lifestyle choices, including:  Daily care of your teeth and gums.  Regular physical activity.  Eating a healthy diet.  Avoiding tobacco and drug use.  Limiting alcohol use.  Practicing safe sex.  Taking low-dose aspirin daily starting at age 2.  Taking vitamin and mineral supplements as recommended by your health care provider. What happens during an annual well check? The services and screenings done by your health care provider during your annual well check will depend on your age, overall health, lifestyle risk factors, and family history of disease. Counseling  Your health care provider may ask you questions  about your:  Alcohol use.  Tobacco use.  Drug use.  Emotional well-being.  Home and relationship well-being.  Sexual activity.  Eating habits.  Work and work Statistician.  Method of birth control.  Menstrual cycle.  Pregnancy history. Screening  You may have the following tests or measurements:  Height, weight, and BMI.  Blood pressure.  Lipid and cholesterol levels. These may be checked every 5 years, or more frequently if you are over 70 years old.  Skin check.  Lung cancer screening. You may have this screening every year starting at age 54 if you have a 30-pack-year history of smoking and currently smoke or have quit within the past 15 years.  Fecal occult blood test (FOBT) of the stool. You may have this test every year starting at age 62.  Flexible sigmoidoscopy or colonoscopy. You may have a sigmoidoscopy every 5 years or a colonoscopy every 10 years starting at age 50.  Hepatitis C blood test.  Hepatitis B blood test.  Sexually transmitted disease (STD) testing.  Diabetes screening. This is done by checking your blood sugar (glucose) after you have not eaten for a while (fasting). You may have this done every 1-3 years.  Mammogram. This may be done every 1-2 years. Talk to your health care provider about when you should start having regular mammograms. This may depend on whether you have a family history of breast cancer.  BRCA-related cancer screening. This may be done if you have a family history of breast, ovarian, tubal, or peritoneal cancers.  Pelvic exam and Pap test. This may be done every 3 years starting at age 66. Starting at age 81, this may be done every 5 years if you have a  Pap test in combination with an HPV test.  Bone density scan. This is done to screen for osteoporosis. You may have this scan if you are at high risk for osteoporosis. Discuss your test results, treatment options, and if necessary, the need for more tests with your  health care provider. Vaccines  Your health care provider may recommend certain vaccines, such as:  Influenza vaccine. This is recommended every year.  Tetanus, diphtheria, and acellular pertussis (Tdap, Td) vaccine. You may need a Td booster every 10 years.  Zoster vaccine. You may need this after age 14.  Pneumococcal 13-valent conjugate (PCV13) vaccine. You may need this if you have certain conditions and were not previously vaccinated.  Pneumococcal polysaccharide (PPSV23) vaccine. You may need one or two doses if you smoke cigarettes or if you have certain conditions. Talk to your health care provider about which screenings and vaccines you need and how often you need them. This information is not intended to replace advice given to you by your health care provider. Make sure you discuss any questions you have with your health care provider. Document Released: 09/15/2015 Document Revised: 05/08/2016 Document Reviewed: 06/20/2015 Elsevier Interactive Patient Education  2017 [place] Prevention in the Home Falls can cause injuries. They can happen to people of all ages. There are many things you can do to make your home safe and to help prevent falls. What can I do on the outside of my home?  Regularly fix the edges of walkways and driveways and fix any cracks.  Remove anything that might make you trip as you walk through a door, such as a raised step or threshold.  Trim any bushes or trees on the path to your home.  Use bright outdoor lighting.  Clear any walking paths of anything that might make someone trip, such as rocks or tools.  Regularly check to see if handrails are loose or broken. Make sure that both sides of any steps have handrails.  Any raised decks and porches should have guardrails on the edges.  Have any leaves, snow, or ice cleared regularly.  Use sand or salt on walking paths during winter.  Clean up any spills in your garage right away.  This includes oil or grease spills. What can I do in the bathroom?  Use night lights.  Install grab bars by the toilet and in the tub and shower. Do not use towel bars as grab bars.  Use non-skid mats or decals in the tub or shower.  If you need to sit down in the shower, use a plastic, non-slip stool.  Keep the floor dry. Clean up any water that spills on the floor as soon as it happens.  Remove soap buildup in the tub or shower regularly.  Attach bath mats securely with double-sided non-slip rug tape.  Do not have throw rugs and other things on the floor that can make you trip. What can I do in the bedroom?  Use night lights.  Make sure that you have a light by your bed that is easy to reach.  Do not use any sheets or blankets that are too big for your bed. They should not hang down onto the floor.  Have a firm chair that has side arms. You can use this for support while you get dressed.  Do not have throw rugs and other things on the floor that can make you trip. What can I do in the kitchen?  Clean  up any spills right away.  Avoid walking on wet floors.  Keep items that you use a lot in easy-to-reach places.  If you need to reach something above you, use a strong step stool that has a grab bar.  Keep electrical cords out of the way.  Do not use floor polish or wax that makes floors slippery. If you must use wax, use non-skid floor wax.  Do not have throw rugs and other things on the floor that can make you trip. What can I do with my stairs?  Do not leave any items on the stairs.  Make sure that there are handrails on both sides of the stairs and use them. Fix handrails that are broken or loose. Make sure that handrails are as long as the stairways.  Check any carpeting to make sure that it is firmly attached to the stairs. Fix any carpet that is loose or worn.  Avoid having throw rugs at the top or bottom of the stairs. If you do have throw rugs, attach them  to the floor with carpet tape.  Make sure that you have a light switch at the top of the stairs and the bottom of the stairs. If you do not have them, ask someone to add them for you. What else can I do to help prevent falls?  Wear shoes that:  Do not have high heels.  Have rubber bottoms.  Are comfortable and fit you well.  Are closed at the toe. Do not wear sandals.  If you use a stepladder:  Make sure that it is fully opened. Do not climb a closed stepladder.  Make sure that both sides of the stepladder are locked into place.  Ask someone to hold it for you, if possible.  Clearly mark and make sure that you can see:  Any grab bars or handrails.  First and last steps.  Where the edge of each step is.  Use tools that help you move around (mobility aids) if they are needed. These include:  Canes.  Walkers.  Scooters.  Crutches.  Turn on the lights when you go into a dark area. Replace any light bulbs as soon as they burn out.  Set up your furniture so you have a clear path. Avoid moving your furniture around.  If any of your floors are uneven, fix them.  If there are any pets around you, be aware of where they are.  Review your medicines with your doctor. Some medicines can make you feel dizzy. This can increase your chance of falling. Ask your doctor what other things that you can do to help prevent falls. This information is not intended to replace advice given to you by your health care provider. Make sure you discuss any questions you have with your health care provider. Document Released: 06/15/2009 Document Revised: 01/25/2016 Document Reviewed: 09/23/2014 Elsevier Interactive Patient Education  2017 [employer].

## 2020-09-06 ENCOUNTER — Ambulatory Visit: Payer: Medicare Other | Admitting: Nurse Practitioner

## 2020-09-08 ENCOUNTER — Other Ambulatory Visit: Payer: Self-pay

## 2020-09-08 ENCOUNTER — Ambulatory Visit (INDEPENDENT_AMBULATORY_CARE_PROVIDER_SITE_OTHER): Payer: Medicare Other | Admitting: Nurse Practitioner

## 2020-09-08 ENCOUNTER — Encounter: Payer: Self-pay | Admitting: Nurse Practitioner

## 2020-09-08 VITALS — BP 98/57 | HR 80 | Temp 98.7°F | Ht 60.0 in | Wt 104.2 lb

## 2020-09-08 DIAGNOSIS — J432 Centrilobular emphysema: Secondary | ICD-10-CM

## 2020-09-08 DIAGNOSIS — Z23 Encounter for immunization: Secondary | ICD-10-CM | POA: Diagnosis not present

## 2020-09-08 DIAGNOSIS — F17219 Nicotine dependence, cigarettes, with unspecified nicotine-induced disorders: Secondary | ICD-10-CM | POA: Diagnosis not present

## 2020-09-08 DIAGNOSIS — E440 Moderate protein-calorie malnutrition: Secondary | ICD-10-CM

## 2020-09-08 DIAGNOSIS — I700 Atherosclerosis of aorta: Secondary | ICD-10-CM | POA: Diagnosis not present

## 2020-09-08 MED ORDER — UMECLIDINIUM-VILANTEROL 62.5-25 MCG/INH IN AEPB: 1.0000 | INHALATION_SPRAY | Freq: Every day | RESPIRATORY_TRACT | 4 refills | Status: DC

## 2020-09-08 NOTE — Assessment & Plan Note (Signed)
I have recommended complete cessation of tobacco use. I have discussed various options available for assistance with tobacco cessation including over the counter methods (Nicotine gum, patch and lozenges). We also discussed prescription options (Chantix, Nicotine Inhaler / Nasal Spray). The patient is not interested in pursuing any prescription tobacco cessation options at this time.  

## 2020-09-08 NOTE — Assessment & Plan Note (Signed)
Noted on past CXR in 2017.  Recommend complete cessation of smoking.  Continue statin at low dose daily and Baby ASA for preventative care.

## 2020-09-08 NOTE — Assessment & Plan Note (Signed)
Ongoing issue, post lung CA.  Will continue Mirtazapine and Dronabinol, as currently prescribed.  This is offering benefit to her appetite and maintaining weight.  She is aware Dronabinol is a controlled substance and will require yearly drug screen and controlled substance contract, which she is agreeable to. UDS up to date and contract.

## 2020-09-08 NOTE — Patient Instructions (Signed)

## 2020-09-08 NOTE — Progress Notes (Signed)
BP (!) 98/57   Pulse 80   Temp 98.7 F (37.1 C) (Oral)   Ht 5' (1.524 m)   Wt 104 lb 3.2 oz (47.3 kg)   SpO2 96%   BMI 20.35 kg/m    Subjective:    Patient ID: Lori Rangel, female    DOB: 09-May-1961, 60 y.o.   MRN: [id number]  HPI: Lori Rangel is a 60 y.o. female  Chief Complaint  Patient presents with  . 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 and last seen 06/01/20.  Started on Anoro last visit and tolerating well and feels she can breath better.  Does have history of thoracic aortic atherosclerosis noted on imaging.  She goes for scans every 6 months.  Has been two years cancer free -- took out part of her upper lobe left side.  Is a current every day smoker -- smokes < 1/2 PPD -- not interested in quitting at this time.  Used to smoke 1 1/2 PPD to 2 PPD. Started smoking at age 35.    Has history of alcohol abuse, but has not used in several years.  She chose smoking over continue alcohol use. COPD status: stable Satisfied with current treatment?: yes Oxygen use: no Dyspnea frequency: none Cough frequency: occasional Rescue inhaler frequency:  occasionally at night Limitation of activity: no Productive cough:  none Last Spirometry: today Pneumovax: will administer today Influenza: yes  Relevant past medical, surgical, family and social history reviewed and updated as indicated. Interim medical history since our last visit reviewed. Allergies and medications reviewed and updated.  Review of Systems  Constitutional: Negative for activity change, appetite change, diaphoresis, fatigue and fever.  Respiratory: Negative for cough, chest tightness, shortness of breath and wheezing.   Cardiovascular: Negative for chest pain, palpitations and leg swelling.  Gastrointestinal: Negative.   Endocrine: Negative for cold intolerance, heat intolerance, polydipsia, polyphagia and polyuria.  Neurological: Negative.   Psychiatric/Behavioral: Negative.      Per HPI unless specifically indicated above     Objective:    BP (!) 98/57   Pulse 80   Temp 98.7 F (37.1 C) (Oral)   Ht 5' (1.524 m)   Wt 104 lb 3.2 oz (47.3 kg)   SpO2 96%   BMI 20.35 kg/m   Wt Readings from Last 3 Encounters:  09/08/20 104 lb 3.2 oz (47.3 kg)  08/04/20 100 lb 14.4 oz (45.8 kg)  07/12/20 100 lb 6.4 oz (45.5 kg)    Physical Exam Vitals and nursing note reviewed.  Constitutional:      General: She is awake. She is not in acute distress.    Appearance: She is well-developed, well-groomed and underweight. She is not ill-appearing.     Comments: Frail appearing, using walker for mobility.  HENT:     Head: Normocephalic.     Right Ear: Hearing normal.     Left Ear: Hearing normal.  Eyes:     General: Lids are normal.        Right eye: No discharge.        Left eye: No discharge.     Conjunctiva/sclera: Conjunctivae normal.     Pupils: Pupils are equal, round, and reactive to light.  Neck:     Thyroid: No thyromegaly.     Vascular: No carotid bruit.  Cardiovascular:     Rate and Rhythm: Normal rate and regular rhythm.     Heart sounds: Normal heart sounds. No murmur  heard. No gallop.   Pulmonary:     Effort: Pulmonary effort is normal. No accessory muscle usage or respiratory distress.     Breath sounds: Decreased breath sounds present.     Comments: Diminished throughout and clear, improved from previous assessment. Abdominal:     General: Bowel sounds are normal.     Palpations: Abdomen is soft.  Musculoskeletal:     Cervical back: Normal range of motion and neck supple.     Right lower leg: No edema.     Left lower leg: No edema.  Lymphadenopathy:     Cervical: No cervical adenopathy.  Skin:    General: Skin is warm and dry.  Neurological:     Mental Status: She is alert and oriented to person, place, and time.  Psychiatric:        Attention and Perception: Attention normal.        Mood and Affect: Mood normal.        Speech: Speech  normal.        Behavior: Behavior normal. Behavior is cooperative.        Thought Content: Thought content normal.    Results for orders placed or performed in visit on 07/12/20  CBC with Differential/Platelet  Result Value Ref Range   WBC 7.0 3.4 - 10.8 x10E3/uL   RBC 4.23 3.77 - 5.28 x10E6/uL   Hemoglobin 13.6 11.1 - 15.9 g/dL   Hematocrit 40.6 34.0 - 46.6 %   MCV 96 79 - 97 fL   MCH 32.2 26.6 - 33.0 pg   MCHC 33.5 31.5 - 35.7 g/dL   RDW 11.8 11.7 - 15.4 %   Platelets 392 150 - 450 x10E3/uL   Neutrophils 63 Not Estab. %   Lymphs 29 Not Estab. %   Monocytes 5 Not Estab. %   Eos 2 Not Estab. %   Basos 1 Not Estab. %   Neutrophils Absolute 4.4 1.4 - 7.0 x10E3/uL   Lymphocytes Absolute 2.0 0.7 - 3.1 x10E3/uL   Monocytes Absolute 0.3 0.1 - 0.9 x10E3/uL   EOS (ABSOLUTE) 0.1 0.0 - 0.4 x10E3/uL   Basophils Absolute 0.1 0.0 - 0.2 x10E3/uL   Immature Granulocytes 0 Not Estab. %   Immature Grans (Abs) 0.0 0.0 - 0.1 x10E3/uL  Comprehensive metabolic panel  Result Value Ref Range   Glucose 89 65 - 99 mg/dL   BUN 3 (L) 8 - 27 mg/dL   Creatinine, Ser 0.60 0.57 - 1.00 mg/dL   GFR calc non Af Amer 99 >59 mL/min/1.73   GFR calc Af Amer 115 >59 mL/min/1.73   BUN/Creatinine Ratio 5 (L) 12 - 28   Sodium 133 (L) 134 - 144 mmol/L   Potassium 4.3 3.5 - 5.2 mmol/L   Chloride 94 (L) 96 - 106 mmol/L   CO2 24 20 - 29 mmol/L   Calcium 9.1 8.7 - 10.3 mg/dL   Total Protein 6.8 6.0 - 8.5 g/dL   Albumin 4.2 3.8 - 4.9 g/dL   Globulin, Total 2.6 1.5 - 4.5 g/dL   Albumin/Globulin Ratio 1.6 1.2 - 2.2   Bilirubin Total <0.2 0.0 - 1.2 mg/dL   Alkaline Phosphatase 161 (H) 44 - 121 IU/L   AST 20 0 - 40 IU/L   ALT 17 0 - 32 IU/L  Lipid Panel w/o Chol/HDL Ratio  Result Value Ref Range   Cholesterol, Total 238 (H) 100 - 199 mg/dL   Triglycerides 166 (H) 0 - 149 mg/dL   HDL 48 >39 mg/dL  VLDL Cholesterol Cal 30 5 - 40 mg/dL   LDL Chol Calc (NIH) 160 (H) 0 - 99 mg/dL  TSH  Result Value Ref Range    TSH 0.657 0.450 - 4.500 uIU/mL  [id number] 11+Oxyco+Alc+Crt-Bund  Result Value Ref Range   Ethanol Negative Cutoff=0.020 %   Amphetamines, Urine Negative Cutoff=1000 ng/mL   Barbiturate Negative Cutoff=200 ng/mL   BENZODIAZ UR QL Negative Cutoff=200 ng/mL   Cannabinoid Quant, Ur See Final Results Cutoff=50 ng/mL   Cocaine (Metabolite) Negative Cutoff=300 ng/mL   OPIATE SCREEN URINE Negative Cutoff=300 ng/mL   Oxycodone/Oxymorphone, Urine Negative Cutoff=300 ng/mL   Phencyclidine Negative Cutoff=25 ng/mL   Methadone Screen, Urine Negative Cutoff=300 ng/mL   Propoxyphene Negative Cutoff=300 ng/mL   Meperidine Negative Cutoff=200 ng/mL   Tramadol Negative Cutoff=200 ng/mL   Creatinine 17.7 (L) 20.0 - 300.0 mg/dL   pH, Urine 7.6 4.5 - 8.9  T4, free  Result Value Ref Range   Free T4 1.11 0.82 - 1.77 ng/dL  Iron, TIBC and Ferritin Panel  Result Value Ref Range   Total Iron Binding Capacity 231 (L) 250 - 450 ug/dL   UIBC 181 131 - 425 ug/dL   Iron 50 27 - 159 ug/dL   Iron Saturation 22 15 - 55 %   Ferritin 674 (H) 15 - 150 ng/mL  Hepatitis C antibody  Result Value Ref Range   Hep C Virus Ab <0.1 0.0 - 0.9 s/co ratio  VITAMIN D 25 Hydroxy (Vit-D Deficiency, Fractures)  Result Value Ref Range   Vit D, 25-Hydroxy 44.4 30.0 - 100.0 ng/mL  Vitamin B12  Result Value Ref Range   Vitamin B-12 >2000 (H) 232 - 1245 pg/mL  Cannabinoid Conf, Ur  Result Value Ref Range   CANNABINOIDS Positive (A) Cutoff=50   Carboxy THC GC/MS Conf 43 Cutoff=15 ng/mL  Specific Gravity  Result Value Ref Range   Specific Gravity 1.0025       Assessment & Plan:   Problem List Items Addressed This Visit      Cardiovascular and Mediastinum   Thoracic aortic atherosclerosis ([place])    Noted on past CXR in 2017.  Recommend complete cessation of smoking.  Continue statin at low dose daily and Baby ASA for preventative care.        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  She is tolerating Anoro well and notices improved function with this, now minimally using Ventolin.  Will send in refills Anoro and continue this regimen.  Discussed with her if progression can consider transition to Trelegy in future and possibly a pulmonary referral.  Recommend complete cessation of smoking.  PPSV23 vaccine today.  Return in 6 months for follow-up.      Relevant Medications   umeclidinium-vilanterol (ANORO ELLIPTA) 62.5-25 MCG/INH AEPB   Other Relevant Orders   Spirometry with graph (Completed)     Nervous and Auditory   Nicotine dependence, cigarettes, w unsp disorders    I have recommended complete cessation of tobacco use. I have discussed various options available for assistance with tobacco cessation including over the counter methods (Nicotine gum, patch and lozenges). We also discussed prescription options (Chantix, Nicotine Inhaler / Nasal Spray). The patient is not interested in pursuing any prescription tobacco cessation options at this time.         Other   Moderate protein-calorie malnutrition ([place])    Ongoing issue, post lung  CA.  Will continue Mirtazapine and Dronabinol, as currently prescribed.  This is offering benefit to her appetite and maintaining weight.  She is aware Dronabinol is a controlled substance and will require yearly drug screen and controlled substance contract, which she is agreeable to. UDS up to date and contract.          Follow up plan: Return in about 6 months (around 03/08/2021) for COPD, ANEMIA, HYPOTHYROID.

## 2020-09-08 NOTE — Assessment & Plan Note (Addendum)
Chronic, ongoing with FEV1 41% today and FEV1/FVC 61% mixed presentation (obstructive/restrictive) moderate stage.  She is tolerating Anoro well and notices improved function with this, now minimally using Ventolin.  Will send in refills Anoro and continue this regimen.  Discussed with her if progression can consider transition to Trelegy in future and possibly a pulmonary referral.  Recommend complete cessation of smoking.  PPSV23 vaccine today.  Return in 6 months for follow-up.

## 2020-10-11 ENCOUNTER — Other Ambulatory Visit: Payer: Self-pay | Admitting: Nurse Practitioner

## 2020-10-11 NOTE — Telephone Encounter (Signed)
Requested medication (s) are due for refill today: Due 10/12/20  Requested medication (s) are on the active medication list: yes   Last refill: 07/12/20  #180   0 refills  Future visit scheduled yes 03/09/21  Notes to clinic: Not delegated  Requested Prescriptions  Pending Prescriptions Disp Refills   dronabinol (MARINOL) 2.5 MG capsule [Pharmacy Med Name: DRONABINOL 2.5 MG CAP] 180 capsule     Sig: TAKE ONE CAPSULE TWICE A DAY BEFORE MEALS      Off-Protocol Failed - 10/11/2020  4:52 PM      Failed - Medication not assigned to a protocol, review manually.      Passed - Valid encounter within last 12 months    Recent Outpatient Visits           1 month ago Centrilobular emphysema ([place])   [place] Cannady, Henrine Screws T, NP   3 months ago Centrilobular emphysema ([place])   [place], Barbaraann Faster, NP       Future Appointments             In 4 months Cannady, Barbaraann Faster, NP [facility]   In 9 months  [facility]

## 2020-10-12 ENCOUNTER — Other Ambulatory Visit: Payer: Self-pay | Admitting: Nurse Practitioner

## 2020-10-12 NOTE — Telephone Encounter (Signed)
PDMP last fill 07/12/20

## 2020-10-12 NOTE — Telephone Encounter (Signed)
Requested medication (s) are due for refill today -yes  Requested medication (s) are on the active medication list -yes  Future visit scheduled -yes  Last refill: 07/21/20  Notes to clinic: Request RF- historical provider- sent for review  Requested Prescriptions  Pending Prescriptions Disp Refills   FEROSUL 325 (65 Fe) MG tablet [Pharmacy Med Name: FEROSUL 325 (65 FE) MG TAB] 180 tablet     Sig: TAKE ONE TABLET BY MOUTH TWICE DAILY WITH MEALS. TAKE ALONG WITH THE VITAMIN C FOR ANEMIA      Endocrinology:  Minerals - Iron Supplementation Failed - 10/12/2020 11:42 AM      Failed - Ferritin in normal range and within 360 days    Ferritin  Date Value Ref Range Status  07/12/2020 674 (H) 15 - 150 ng/mL Final          Passed - HGB in normal range and within 360 days    Hemoglobin  Date Value Ref Range Status  07/12/2020 13.6 11.1 - 15.9 g/dL Final          Passed - HCT in normal range and within 360 days    Hematocrit  Date Value Ref Range Status  07/12/2020 40.6 34.0 - 46.6 % Final          Passed - RBC in normal range and within 360 days    RBC  Date Value Ref Range Status  07/12/2020 4.23 3.77 - 5.28 x10E6/uL Final  08/29/2018 3.01 (L) 3.87 - 5.11 MIL/uL Final          Passed - Fe (serum) in normal range and within 360 days    Iron  Date Value Ref Range Status  07/12/2020 50 27 - 159 ug/dL Final   Iron Saturation  Date Value Ref Range Status  07/12/2020 22 15 - 55 % Final          Passed - Valid encounter within last 12 months    Recent Outpatient Visits           1 month ago Centrilobular emphysema ([place])   [place], Jolene T, NP   3 months ago Centrilobular emphysema ([place])   [place], Barbaraann Faster, NP       Future Appointments             In 4 months Cannady, Barbaraann Faster, NP [facility]   In 9 months  [facility]              Refused Prescriptions Disp Refills    dronabinol (MARINOL) 2.5 MG capsule [Pharmacy Med Name: DRONABINOL 2.5 MG CAP] 180 capsule     Sig: TAKE ONE CAPSULE TWICE A DAY BEFORE MEALS      Off-Protocol Failed - 10/12/2020 11:42 AM      Failed - Medication not assigned to a protocol, review manually.      Passed - Valid encounter within last 12 months    Recent Outpatient Visits           1 month ago Centrilobular emphysema ([place])   [place] Cannady, Henrine Screws T, NP   3 months ago Centrilobular emphysema ([place])   [place], Barbaraann Faster, NP       Future Appointments             In 4 months Cannady, Barbaraann Faster, NP [facility]   In 9 months  [facility]  Requested Prescriptions  Pending Prescriptions Disp Refills   FEROSUL 325 (65 Fe) MG tablet [Pharmacy Med Name: FEROSUL 325 (65 FE) MG TAB] 180 tablet     Sig: TAKE ONE TABLET BY MOUTH TWICE DAILY WITH MEALS. TAKE ALONG WITH THE VITAMIN C FOR ANEMIA      Endocrinology:  Minerals - Iron Supplementation Failed - 10/12/2020 11:42 AM      Failed - Ferritin in normal range and within 360 days    Ferritin  Date Value Ref Range Status  07/12/2020 674 (H) 15 - 150 ng/mL Final          Passed - HGB in normal range and within 360 days    Hemoglobin  Date Value Ref Range Status  07/12/2020 13.6 11.1 - 15.9 g/dL Final          Passed - HCT in normal range and within 360 days    Hematocrit  Date Value Ref Range Status  07/12/2020 40.6 34.0 - 46.6 % Final          Passed - RBC in normal range and within 360 days    RBC  Date Value Ref Range Status  07/12/2020 4.23 3.77 - 5.28 x10E6/uL Final  08/29/2018 3.01 (L) 3.87 - 5.11 MIL/uL Final          Passed - Fe (serum) in normal range and within 360 days    Iron  Date Value Ref Range Status  07/12/2020 50 27 - 159 ug/dL Final   Iron Saturation  Date Value Ref Range Status  07/12/2020 22 15 - 55 % Final          Passed -  Valid encounter within last 12 months    Recent Outpatient Visits           1 month ago Centrilobular emphysema ([place])   [place], Jolene T, NP   3 months ago Centrilobular emphysema ([place])   [place], Barbaraann Faster, NP       Future Appointments             In 4 months Cannady, Barbaraann Faster, NP [facility]   In 9 months  [facility]              Refused Prescriptions Disp Refills   dronabinol (MARINOL) 2.5 MG capsule [Pharmacy Med Name: DRONABINOL 2.5 MG CAP] 180 capsule     Sig: TAKE ONE CAPSULE TWICE A DAY BEFORE MEALS      Off-Protocol Failed - 10/12/2020 11:42 AM      Failed - Medication not assigned to a protocol, review manually.      Passed - Valid encounter within last 12 months    Recent Outpatient Visits           1 month ago Centrilobular emphysema ([place])   [place] Cannady, Henrine Screws T, NP   3 months ago Centrilobular emphysema ([place])   [place], Barbaraann Faster, NP       Future Appointments             In 4 months Cannady, Barbaraann Faster, NP [facility]   In 9 months  [facility]

## 2020-12-11 ENCOUNTER — Other Ambulatory Visit: Payer: Self-pay | Admitting: Nurse Practitioner

## 2020-12-11 NOTE — Telephone Encounter (Signed)
Requested medications are due for refill today.  Unknown  Requested medications are on the active medications list.  yes  Last refill. 10/14/2016  Future visit scheduled.   yes  Notes to clinic.  Rx expired.

## 2020-12-12 NOTE — Telephone Encounter (Signed)
[facility] apt was on 09/08/2020 next apt is scheduled for 03/09/2021 per las office note Return in about 6 months (around 03/08/2021) for COPD, ANEMIA, HYPOTHYROID.

## 2021-01-08 ENCOUNTER — Other Ambulatory Visit: Payer: Self-pay | Admitting: Nurse Practitioner

## 2021-01-08 NOTE — Telephone Encounter (Signed)
Requested medication (s) are due for refill today: yes  Requested medication (s) are on the active medication list: yes  Last refill:  10/12/20  Future visit scheduled: yes  Notes to clinic:  med not assigned to a protocol   Requested Prescriptions  Pending Prescriptions Disp Refills   dronabinol (MARINOL) 2.5 MG capsule [Pharmacy Med Name: DRONABINOL 2.5 MG CAP] 180 capsule     Sig: TAKE ONE CAPSULE TWICE A DAY BEFORE MEALS      Off-Protocol Failed - 01/08/2021  2:13 PM      Failed - Medication not assigned to a protocol, review manually.      Passed - Valid encounter within last 12 months    Recent Outpatient Visits           4 months ago Centrilobular emphysema ([place])   [place], Henrine Screws T, NP   6 months ago Centrilobular emphysema ([place])   [place], Barbaraann Faster, NP       Future Appointments             In 2 months Cannady, Barbaraann Faster, NP [facility]   In 7 months  [facility]

## 2021-01-08 NOTE — Telephone Encounter (Signed)
Pt next apt on 03/09/2021 per last note Return in about 6 months (around 03/08/2021) for COPD, ANEMIA, HYPOTHYROID.

## 2021-03-09 ENCOUNTER — Ambulatory Visit (INDEPENDENT_AMBULATORY_CARE_PROVIDER_SITE_OTHER): Payer: Medicare Other | Admitting: Nurse Practitioner

## 2021-03-09 ENCOUNTER — Other Ambulatory Visit: Payer: Self-pay

## 2021-03-09 ENCOUNTER — Encounter: Payer: Self-pay | Admitting: Nurse Practitioner

## 2021-03-09 VITALS — BP 126/66 | HR 86 | Temp 99.2°F | Wt 102.8 lb

## 2021-03-09 DIAGNOSIS — E782 Mixed hyperlipidemia: Secondary | ICD-10-CM | POA: Insufficient documentation

## 2021-03-09 DIAGNOSIS — Z85118 Personal history of other malignant neoplasm of bronchus and lung: Secondary | ICD-10-CM | POA: Diagnosis not present

## 2021-03-09 DIAGNOSIS — E039 Hypothyroidism, unspecified: Secondary | ICD-10-CM

## 2021-03-09 DIAGNOSIS — I700 Atherosclerosis of aorta: Secondary | ICD-10-CM

## 2021-03-09 DIAGNOSIS — J432 Centrilobular emphysema: Secondary | ICD-10-CM

## 2021-03-09 DIAGNOSIS — F17219 Nicotine dependence, cigarettes, with unspecified nicotine-induced disorders: Secondary | ICD-10-CM

## 2021-03-09 DIAGNOSIS — E538 Deficiency of other specified B group vitamins: Secondary | ICD-10-CM

## 2021-03-09 DIAGNOSIS — D508 Other iron deficiency anemias: Secondary | ICD-10-CM

## 2021-03-09 DIAGNOSIS — E440 Moderate protein-calorie malnutrition: Secondary | ICD-10-CM

## 2021-03-09 NOTE — Assessment & Plan Note (Signed)
Noted on past CXR in 2017.  Recommend complete cessation of smoking.  Continue statin at low dose daily and Baby ASA for preventative care.

## 2021-03-09 NOTE — Assessment & Plan Note (Signed)
Ongoing, continues on daily iron and Vitamin C.  Will continue this regimen and obtain labs today to include CBC, iron, ferritin, and B12.

## 2021-03-09 NOTE — Assessment & Plan Note (Addendum)
Chronic, ongoing with FEV1 41% in January 2022 and FEV1/FVC 61% mixed presentation (obstructive/restrictive) moderate stage.  She is tolerating Anoro well and notices improved function with this, continues minimally using Ventolin.  Discussed with her if progression can consider transition to Trelegy in future and possibly a pulmonary referral.  Recommend complete cessation of smoking.  .  Return in 6 months for follow-up.  CBC today.

## 2021-03-09 NOTE — Patient Instructions (Signed)
COPD and Physical Activity Chronic obstructive pulmonary disease (COPD) is a long-term, or chronic, condition that affects the lungs. COPD is a general term that can be used to describe many problems that cause inflammation of the lungs and limit airflow. These conditions include chronic bronchitis and emphysema. The main symptom of COPD is shortness of breath, which makes it harder to do even simple tasks. This can also make it harder to exercise and stay active. Talk with your health care provider about treatments to help you breathe better and actions you can take to prevent breathing problems during physical activity. What are the benefits of exercising when you have COPD? Exercising regularly is an important part of a healthy lifestyle. You can still exercise and do physical activities even though you have COPD. Exercise and physical activity improve your shortness of breath by increasing blood flow (circulation). This causes your heart to pump more oxygen through your body. Moderate exercise can: Improve oxygen use. Increase your energy level. Help with shortness of breath. Strengthen your breathing muscles. Improve heart health. Help with sleep. Improve your self-esteem and feelings of self-worth. Lower depression, stress, and anxiety. Exercise can benefit everyone with COPD. The severity of your disease may affect how hard you can exercise, especially at first, but everyone can benefit. Talk with your health care provider about how much exercise is safe for you, and which activities and exercises are safe for you. What actions can I take to prevent breathing problems during physical activity? Sign up for a pulmonary rehabilitation program. This type of program may include: Education about lung diseases. Exercise classes that teach you how to exercise and be more active while improving your breathing. This usually involves: Exercise using your lower extremities, such as a stationary  bicycle. About 30 minutes of exercise, 2 to 5 times per week, for 6 to 12 weeks. Strength training, such as push-ups or leg lifts. Nutrition education. Group classes in which you can talk with others who also have COPD and learn ways to manage stress. If you use an oxygen tank, you should use it while you exercise. Work with your health care provider to adjust your oxygen for your physical activity. Your resting flow rate is different from your flow rate during physical activity. How to manage your breathing while exercising While you are exercising: Take slow breaths. Pace yourself, and do nottry to go too fast. Purse your lips while breathing out. Pursing your lips is similar to a kissing or whistling position. If doing exercise that uses a quick burst of effort, such as weight lifting: Breathe in before starting the exercise. Breathe out during the hardest part of the exercise, such as raising the weights. Where to find support You can find support for exercising with COPD from: Your health care provider. A pulmonary rehabilitation program. Your local health department or community health programs. Support groups, either online or in-person. Your health care provider may be able to recommend support groups. Where to find more information You can find more information about exercising with COPD from: American Lung Association: lung.org COPD Foundation: copdfoundation.org Contact a health care provider if: Your symptoms get worse. You have nausea. You have a fever. You want to start a new exercise program or a new activity. Get help right away if: You have chest pain. You cannot breathe. These symptoms may represent a serious problem that is an emergency. Do not wait to see if the symptoms will go away. Get medical help right away. Call   your local emergency services (911 in the [place] Do not drive yourself to the hospital. Summary COPD is a general term that can be used to describe  many different lung problems that cause lung inflammation and limit airflow. This includes chronic bronchitis and emphysema. Exercise and physical activity improve your shortness of breath by increasing blood flow (circulation). This causes your heart to provide more oxygen to your body. Contact your health care provider before starting any exercise program or new activity. Ask your health care provider what exercises and activities are safe for you. This information is not intended to replace advice given to you by your health care provider. Make sure you discuss any questions you have with your health care provider. Document Revised: 06/27/2020 Document Reviewed: 06/27/2020 Elsevier Patient Education  2022 Elsevier Inc.  

## 2021-03-09 NOTE — Assessment & Plan Note (Signed)
Ongoing issue, post lung CA.  Will continue Mirtazapine and Dronabinol, as currently prescribed.  This is offering benefit to her appetite and maintaining weight.  She is aware Dronabinol is a controlled substance and will require yearly drug screen and controlled substance contract, which she is agreeable to. UDS up to date and contract.

## 2021-03-09 NOTE — Progress Notes (Signed)
BP 126/66   Pulse 86   Temp 99.2 F (37.3 C) (Oral)   Wt 102 lb 12.8 oz (46.6 kg)   SpO2 96%   BMI 20.08 kg/m    Subjective:    Patient ID: Lori Rangel, female    DOB: Mar 14, 1961, 60 y.o.   MRN: [id number]  HPI: Lori Rangel is a 60 y.o. 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  Continues Anoro daily.  Does have history of thoracic aortic atherosclerosis noted on imaging.  She is to go for every year CT scans now.  Has been 2 1/2 years cancer free -- took out part of her upper lobe left side.  Is a current every day smoker -- smokes < 1/2 PPD -- not interested in quitting at this time.  Used to smoke 1 1/2 PPD to 2 PPD. Started smoking at age 35.  She takes Mirtazapine and Marinol for weight, as is underweight post cancer, this was started by [facility]. COPD status: stable Satisfied with current treatment?: yes Oxygen use: no Dyspnea frequency: none Cough frequency: rarely -- notices more if really active Rescue inhaler frequency:  only if absolutely needs this, at night on occasion Limitation of activity: no Productive cough:  none Last Spirometry: January 2022 Pneumovax: up to date Influenza: yes  HYPERLIPIDEMIA Continues on Crestor 10 MG daily.   Hyperlipidemia status: good compliance Satisfied with current treatment?  yes Side effects:  no Medication compliance: good compliance Supplements: none Aspirin:  yes The 10-year ASCVD risk score Mikey Bussing DC Jr., et al., 2013) is: 8.9%   Values used to calculate the score:     Age: 55 years     Sex: Female     Is Non-Hispanic African American: No     Diabetic: No     Tobacco smoker: Yes     Systolic Blood Pressure: 277 mmHg     Is BP treated: No     HDL Cholesterol: 48 mg/dL     Total Cholesterol: 238 mg/dL Chest pain:  no Coronary artery disease:   no Family history CAD:  yes in aunt Family history early CAD:  no   HYPOTHYROIDISM Continues on Levothyroxine 25 MCG daily.  TSH in November 2021 was 0.657. Thyroid control status:stable Satisfied with current treatment? yes Medication side effects: no Medication compliance: good compliance Etiology of hypothyroidism:  Recent dose adjustment:no Fatigue: no Cold intolerance: no Heat intolerance: no Weight gain: no Weight loss: no Constipation: no Diarrhea/loose stools: no Palpitations: no Lower extremity edema: no Anxiety/depressed mood: no   ANEMIA Last H/H in November 2021 was normal.  Continues on iron supplement and Vitamin C.  She continues on Prilosec for GERD and has tried discontinuing or reducing in past, but heart burn returned. Anemia status: stable Etiology of anemia: unknown Duration of anemia treatment: long term Compliance with treatment: good compliance Iron supplementation side effects: no Severity of anemia: mild Fatigue: no Decreased exercise tolerance: no  Dyspnea on exertion: occasional Palpitations: no Bleeding: no Pica: no   Relevant past medical, surgical, family and social history reviewed and updated as indicated. Interim medical history since our last visit reviewed. Allergies and medications reviewed and updated.  Review of Systems  Constitutional:  Negative for activity change, appetite change, diaphoresis, fatigue and fever.  Respiratory:  Negative for cough, chest tightness, shortness of breath and wheezing.   Cardiovascular:  Negative for chest pain, palpitations and leg swelling.  Gastrointestinal: Negative.   Endocrine: Negative for cold intolerance, heat intolerance, polydipsia, polyphagia and polyuria.  Neurological: Negative.   Psychiatric/Behavioral: Negative.     Per HPI unless specifically indicated above     Objective:    BP 126/66   Pulse 86   Temp 99.2 F (37.3 C) (Oral)   Wt 102 lb 12.8 oz (46.6 kg)   SpO2 96%    BMI 20.08 kg/m   Wt Readings from Last 3 Encounters:  03/09/21 102 lb 12.8 oz (46.6 kg)  09/08/20 104 lb 3.2 oz (47.3 kg)  08/04/20 100 lb 14.4 oz (45.8 kg)    Physical Exam Vitals and nursing note reviewed.  Constitutional:      General: She is awake. She is not in acute distress.    Appearance: She is well-developed, well-groomed and underweight. She is not ill-appearing.     Comments: Frail appearing, using walker for mobility.  HENT:     Head: Normocephalic.     Right Ear: Hearing normal.     Left Ear: Hearing normal.  Eyes:     General: Lids are normal.        Right eye: No discharge.        Left eye: No discharge.     Conjunctiva/sclera: Conjunctivae normal.     Pupils: Pupils are equal, round, and reactive to light.  Neck:     Thyroid: No thyromegaly.     Vascular: No carotid bruit.  Cardiovascular:     Rate and Rhythm: Normal rate and regular rhythm.     Heart sounds: Normal heart sounds. No murmur heard.   No gallop.  Pulmonary:     Effort: Pulmonary effort is normal. No accessory muscle usage or respiratory distress.     Breath sounds: Decreased breath sounds present.     Comments: Diminished throughout and clear, no wheezes. Abdominal:     General: Bowel sounds are normal.     Palpations: Abdomen is soft.  Musculoskeletal:     Cervical back: Normal range of motion and neck supple.     Right lower leg: No edema.     Left lower leg: No edema.  Lymphadenopathy:     Cervical: No cervical adenopathy.  Skin:    General: Skin is warm and dry.  Neurological:     Mental Status: She is alert and oriented to person, place, and time.  Psychiatric:        Attention and Perception: Attention normal.        Mood and Affect: Mood normal.        Speech: Speech normal.        Behavior: Behavior normal. Behavior is cooperative.        Thought Content: Thought content normal.   Results for orders placed or performed in visit on 07/12/20  CBC with Differential/Platelet   Result Value Ref Range   WBC 7.0 3.4 - 10.8 x10E3/uL   RBC 4.23 3.77 - 5.28 x10E6/uL   Hemoglobin 13.6 11.1 - 15.9 g/dL   Hematocrit 40.6 34.0 - 46.6 %   MCV 96 79 - 97 fL   MCH 32.2 26.6 - 33.0 pg   MCHC 33.5 31.5 - 35.7 g/dL   RDW 11.8 11.7 - 15.4 %   Platelets 392 150 - 450 x10E3/uL   Neutrophils 63 Not Estab. %   Lymphs 29  Not Estab. %   Monocytes 5 Not Estab. %   Eos 2 Not Estab. %   Basos 1 Not Estab. %   Neutrophils Absolute 4.4 1.4 - 7.0 x10E3/uL   Lymphocytes Absolute 2.0 0.7 - 3.1 x10E3/uL   Monocytes Absolute 0.3 0.1 - 0.9 x10E3/uL   EOS (ABSOLUTE) 0.1 0.0 - 0.4 x10E3/uL   Basophils Absolute 0.1 0.0 - 0.2 x10E3/uL   Immature Granulocytes 0 Not Estab. %   Immature Grans (Abs) 0.0 0.0 - 0.1 x10E3/uL  Comprehensive metabolic panel  Result Value Ref Range   Glucose 89 65 - 99 mg/dL   BUN 3 (L) 8 - 27 mg/dL   Creatinine, Ser 0.60 0.57 - 1.00 mg/dL   GFR calc non Af Amer 99 >59 mL/min/1.73   GFR calc Af Amer 115 >59 mL/min/1.73   BUN/Creatinine Ratio 5 (L) 12 - 28   Sodium 133 (L) 134 - 144 mmol/L   Potassium 4.3 3.5 - 5.2 mmol/L   Chloride 94 (L) 96 - 106 mmol/L   CO2 24 20 - 29 mmol/L   Calcium 9.1 8.7 - 10.3 mg/dL   Total Protein 6.8 6.0 - 8.5 g/dL   Albumin 4.2 3.8 - 4.9 g/dL   Globulin, Total 2.6 1.5 - 4.5 g/dL   Albumin/Globulin Ratio 1.6 1.2 - 2.2   Bilirubin Total <0.2 0.0 - 1.2 mg/dL   Alkaline Phosphatase 161 (H) 44 - 121 IU/L   AST 20 0 - 40 IU/L   ALT 17 0 - 32 IU/L  Lipid Panel w/o Chol/HDL Ratio  Result Value Ref Range   Cholesterol, Total 238 (H) 100 - 199 mg/dL   Triglycerides 166 (H) 0 - 149 mg/dL   HDL 48 >39 mg/dL   VLDL Cholesterol Cal 30 5 - 40 mg/dL   LDL Chol Calc (NIH) 160 (H) 0 - 99 mg/dL  TSH  Result Value Ref Range   TSH 0.657 0.450 - 4.500 uIU/mL  [id number] 11+Oxyco+Alc+Crt-Bund  Result Value Ref Range   Ethanol Negative Cutoff=0.020 %   Amphetamines, Urine Negative Cutoff=1000 ng/mL   Barbiturate Negative Cutoff=200 ng/mL    BENZODIAZ UR QL Negative Cutoff=200 ng/mL   Cannabinoid Quant, Ur See Final Results Cutoff=50 ng/mL   Cocaine (Metabolite) Negative Cutoff=300 ng/mL   OPIATE SCREEN URINE Negative Cutoff=300 ng/mL   Oxycodone/Oxymorphone, Urine Negative Cutoff=300 ng/mL   Phencyclidine Negative Cutoff=25 ng/mL   Methadone Screen, Urine Negative Cutoff=300 ng/mL   Propoxyphene Negative Cutoff=300 ng/mL   Meperidine Negative Cutoff=200 ng/mL   Tramadol Negative Cutoff=200 ng/mL   Creatinine 17.7 (L) 20.0 - 300.0 mg/dL   pH, Urine 7.6 4.5 - 8.9  T4, free  Result Value Ref Range   Free T4 1.11 0.82 - 1.77 ng/dL  Iron, TIBC and Ferritin Panel  Result Value Ref Range   Total Iron Binding Capacity 231 (L) 250 - 450 ug/dL   UIBC 181 131 - 425 ug/dL   Iron 50 27 - 159 ug/dL   Iron Saturation 22 15 - 55 %   Ferritin 674 (H) 15 - 150 ng/mL  Hepatitis C antibody  Result Value Ref Range   Hep C Virus Ab <0.1 0.0 - 0.9 s/co ratio  VITAMIN D 25 Hydroxy (Vit-D Deficiency, Fractures)  Result Value Ref Range   Vit D, 25-Hydroxy 44.4 30.0 - 100.0 ng/mL  Vitamin B12  Result Value Ref Range   Vitamin B-12 >2000 (H) 232 - 1245 pg/mL  Cannabinoid Conf, Ur  Result Value Ref Range   CANNABINOIDS Positive (  A) Cutoff=50   Carboxy THC GC/MS Conf 43 Cutoff=15 ng/mL  Specific Gravity  Result Value Ref Range   Specific Gravity 1.0025       Assessment & Plan:   Problem List Items Addressed This Visit       Cardiovascular and Mediastinum   Thoracic aortic atherosclerosis ([place])    Noted on past CXR in 2017.  Recommend complete cessation of smoking.  Continue statin at low dose daily and Baby ASA for preventative care.       Relevant Medications   aspirin 81 MG EC tablet     Respiratory   COPD with emphysema (HCC) - Primary    Chronic, ongoing with FEV1 41% in January 2022 and FEV1/FVC 61% mixed presentation (obstructive/restrictive) moderate stage.  She is tolerating Anoro well and notices improved function  with this, continues minimally using Ventolin.  Discussed with her if progression can consider transition to Trelegy in future and possibly a pulmonary referral.  Recommend complete cessation of smoking.  .  Return in 6 months for follow-up.  CBC today.       Relevant Orders   CBC with Differential/Platelet     Endocrine   Hypothyroidism    Chronic, ongoing.  Will continue current regimen at this time and adjust as needed based on labs.  TSH and Free T4 today + antibody.       Relevant Medications   levothyroxine (SYNTHROID) 25 MCG tablet   Other Relevant Orders   TSH   T4, free   Thyroid peroxidase antibody     Nervous and Auditory   Nicotine dependence, cigarettes, w unsp disorders    I have recommended complete cessation of tobacco use. I have discussed various options available for assistance with tobacco cessation including over the counter methods (Nicotine gum, patch and lozenges). We also discussed prescription options (Chantix, Nicotine Inhaler / Nasal Spray). The patient is not interested in pursuing any prescription tobacco cessation options at this time.          Other   Moderate protein-calorie malnutrition ([place])    Ongoing issue, post lung CA.  Will continue Mirtazapine and Dronabinol, as currently prescribed.  This is offering benefit to her appetite and maintaining weight.  She is aware Dronabinol is a controlled substance and will require yearly drug screen and controlled substance contract, which she is agreeable to. UDS up to date and contract.       History of lung cancer    [facility] oncology follows -- presented to upper left lobe in 2019 -- had partial lobectomy and has every 12 months scans.  Continue this collaboration.       Iron deficiency anemia secondary to inadequate dietary iron intake    Ongoing, continues on daily iron and Vitamin C.  Will continue this regimen and obtain labs today to include CBC, iron, ferritin, and B12.       Relevant Orders    CBC with Differential/Platelet   Vitamin B12   Iron, TIBC and Ferritin Panel   Mixed hyperlipidemia    Chronic, ongoing.  Continue current medication regimen and adjust as needed.  Lipid panel today.       Relevant Medications   aspirin 81 MG EC tablet   Other Relevant Orders   Comprehensive metabolic panel   Lipid Panel w/o Chol/HDL Ratio   Other Visit Diagnoses     B12 deficiency       History of low levels, recent was stable, recheck today.   Relevant  Orders   Vitamin B12        Follow up plan: Return in about 6 months (around 09/09/2021) for HLD, COPD, ANEMIA, LUNG CA,.

## 2021-03-09 NOTE — Assessment & Plan Note (Signed)
[facility] oncology follows -- presented to upper left lobe in 2019 -- had partial lobectomy and has every 12 months scans.  Continue this collaboration.

## 2021-03-09 NOTE — Assessment & Plan Note (Signed)
Chronic, ongoing.  Continue current medication regimen and adjust as needed. Lipid panel today. 

## 2021-03-09 NOTE — Assessment & Plan Note (Signed)
Chronic, ongoing.  Will continue current regimen at this time and adjust as needed based on labs.  TSH and Free T4 today + antibody.

## 2021-03-09 NOTE — Assessment & Plan Note (Signed)
I have recommended complete cessation of tobacco use. I have discussed various options available for assistance with tobacco cessation including over the counter methods (Nicotine gum, patch and lozenges). We also discussed prescription options (Chantix, Nicotine Inhaler / Nasal Spray). The patient is not interested in pursuing any prescription tobacco cessation options at this time.  

## 2021-03-10 LAB — TSH: TSH: 0.97 u[IU]/mL (ref 0.450–4.500)

## 2021-03-10 LAB — LIPID PANEL W/O CHOL/HDL RATIO
Cholesterol, Total: 144 mg/dL (ref 100–199)
HDL: 41 mg/dL (ref >39)
LDL Chol Calc (NIH): 63 mg/dL (ref 0–99)
Triglycerides: 247 mg/dL — ABNORMAL HIGH (ref 0–149)
VLDL Cholesterol Cal: 40 mg/dL (ref 5–40)

## 2021-03-10 LAB — COMPREHENSIVE METABOLIC PANEL
ALT: 11 IU/L (ref 0–32)
AST: 19 IU/L (ref 0–40)
Albumin/Globulin Ratio: 1.8 (ref 1.2–2.2)
Albumin: 4.4 g/dL (ref 3.8–4.8)
Alkaline Phosphatase: 125 IU/L — ABNORMAL HIGH (ref 44–121)
BUN/Creatinine Ratio: 7 — ABNORMAL LOW (ref 12–28)
BUN: 4 mg/dL — ABNORMAL LOW (ref 8–27)
Bilirubin Total: <0.2 mg/dL (ref 0.0–1.2)
CO2: 24 mmol/L (ref 20–29)
Calcium: 9.1 mg/dL (ref 8.7–10.3)
Chloride: 99 mmol/L (ref 96–106)
Creatinine, Ser: 0.58 mg/dL (ref 0.57–1.00)
Globulin, Total: 2.5 g/dL (ref 1.5–4.5)
Glucose: 86 mg/dL (ref 65–99)
Potassium: 4.4 mmol/L (ref 3.5–5.2)
Sodium: 140 mmol/L (ref 134–144)
Total Protein: 6.9 g/dL (ref 6.0–8.5)
eGFR: 103 mL/min/{1.73_m2} (ref >59)

## 2021-03-10 LAB — CBC WITH DIFFERENTIAL/PLATELET
Basophils Absolute: 0 10*3/uL (ref 0.0–0.2)
Basos: 1 %
EOS (ABSOLUTE): 0.1 10*3/uL (ref 0.0–0.4)
Eos: 1 %
Hematocrit: 42 % (ref 34.0–46.6)
Hemoglobin: 14.3 g/dL (ref 11.1–15.9)
Immature Grans (Abs): 0 10*3/uL (ref 0.0–0.1)
Immature Granulocytes: 0 %
Lymphocytes Absolute: 2.1 10*3/uL (ref 0.7–3.1)
Lymphs: 25 %
MCH: 32.4 pg (ref 26.6–33.0)
MCHC: 34 g/dL (ref 31.5–35.7)
MCV: 95 fL (ref 79–97)
Monocytes Absolute: 0.5 10*3/uL (ref 0.1–0.9)
Monocytes: 5 %
Neutrophils Absolute: 5.6 10*3/uL (ref 1.4–7.0)
Neutrophils: 68 %
Platelets: 303 10*3/uL (ref 150–450)
RBC: 4.41 x10E6/uL (ref 3.77–5.28)
RDW: 11 % — ABNORMAL LOW (ref 11.7–15.4)
WBC: 8.3 10*3/uL (ref 3.4–10.8)

## 2021-03-10 LAB — IRON,TIBC AND FERRITIN PANEL
Ferritin: 651 ng/mL — ABNORMAL HIGH (ref 15–150)
Iron Saturation: 25 % (ref 15–55)
Iron: 64 ug/dL (ref 27–139)
Total Iron Binding Capacity: 256 ug/dL (ref 250–450)
UIBC: 192 ug/dL (ref 118–369)

## 2021-03-10 LAB — THYROID PEROXIDASE ANTIBODY: Thyroperoxidase Ab SerPl-aCnc: 9 IU/mL (ref 0–34)

## 2021-03-10 LAB — T4, FREE: Free T4: 1.07 ng/dL (ref 0.82–1.77)

## 2021-03-10 LAB — VITAMIN B12: Vitamin B-12: 1976 pg/mL — ABNORMAL HIGH (ref 232–1245)

## 2021-03-10 NOTE — Progress Notes (Signed)
Good morning, please let Breda know all labs have returned: - CBC remains stable with no anemia and iron level normal, continue supplement.  Ferritin level is elevated, this could be related to past heavier alcohol use or past cancer, we will recheck next visit.  B12 level above normal, can start taking Vitamin B12 every other day and not every day. - Thyroid labs normal. Continue current Levothyroxine dosing. - Remainder of labs are stable, we will continue to monitor.  Any questions? Keep being awesome!!  Thank you for allowing me to participate in your care.  I appreciate you. Kindest regards, Zay Yeargan

## 2021-04-13 ENCOUNTER — Other Ambulatory Visit: Payer: Self-pay | Admitting: Nurse Practitioner

## 2021-04-13 NOTE — Telephone Encounter (Signed)
Requested medications are due for refill today yes  Requested medications are on the active medication list yes  Last refill 01/08/21  Last visit 03/2021  Future visit scheduled 09/2021  Notes to clinic This does not have a protocol, please assess.

## 2021-04-16 ENCOUNTER — Other Ambulatory Visit: Payer: Self-pay | Admitting: Nurse Practitioner

## 2021-04-16 NOTE — Telephone Encounter (Signed)
Patient last seen 03/09/21 and has appointment 09/10/21

## 2021-04-18 ENCOUNTER — Other Ambulatory Visit: Payer: Self-pay | Admitting: Nurse Practitioner

## 2021-04-18 NOTE — Telephone Encounter (Signed)
   Notes to clinic:  Medication filled by historical provider Review for refill    Requested Prescriptions  Pending Prescriptions Disp Refills   levothyroxine (SYNTHROID) 25 MCG tablet [Pharmacy Med Name: LEVOTHYROXINE SODIUM 25 MCG TAB] 30 tablet     Sig: TAKE 1 TABLET EVERY DAY ON EMPTY [place] A GLASS OF WATER AT LEAST 30-60 [place]     Endocrinology:  Hypothyroid Agents Failed - 04/18/2021 12:41 PM      Failed - TSH needs to be rechecked within 3 months after an abnormal result. Refill until TSH is due.      Passed - TSH in normal range and within 360 days    TSH  Date Value Ref Range Status  03/09/2021 0.970 0.450 - 4.500 uIU/mL Final          Passed - Valid encounter within last 12 months    Recent Outpatient Visits           1 month ago Centrilobular emphysema ([place])   [place], Henrine Screws T, NP   7 months ago Centrilobular emphysema ([place])   [place] Cannady, Jolene T, NP   9 months ago Centrilobular emphysema ([place])   [place], Barbaraann Faster, NP       Future Appointments             In 3 months  [place], [place]   In 4 months Cannady, Barbaraann Faster, NP [facility], [facility]

## 2021-04-18 NOTE — Telephone Encounter (Signed)
Patient last seen 03/09/21 and has appointment 09/2021

## 2021-07-14 ENCOUNTER — Other Ambulatory Visit: Payer: Self-pay | Admitting: Nurse Practitioner

## 2021-07-15 NOTE — Telephone Encounter (Signed)
Requested medication (s) are due for refill today: Yes  Requested medication (s) are on the active medication list: Yes  Last refill:  04/16/21  Future visit scheduled: Yes  Notes to clinic:  Unable to refill per protocol, cannot delegate.      Requested Prescriptions  Pending Prescriptions Disp Refills   dronabinol (MARINOL) 2.5 MG capsule [Pharmacy Med Name: DRONABINOL 2.5 MG CAP] 180 capsule     Sig: TAKE ONE CAPSULE TWICE A DAY BEFORE MEALS     Off-Protocol Failed - 07/14/2021  1:08 PM      Failed - Medication not assigned to a protocol, review manually.      Passed - Valid encounter within last 12 months    Recent Outpatient Visits           4 months ago Centrilobular emphysema ([place])   [place] Cannady, Henrine Screws T, NP   10 months ago Centrilobular emphysema ([place])   [place], Barbaraann Faster, NP   1 year ago Centrilobular emphysema ([place])   [place], Barbaraann Faster, NP       Future Appointments             In 3 weeks  [place]   In 1 month [place], [place] T, NP [facility], [facility]            Signed Prescriptions Disp Refills   rosuvastatin (CRESTOR) 10 MG tablet 90 tablet 2    Sig: TAKE 1 TABLET BY MOUTH DAILY.     Cardiovascular:  Antilipid - Statins Failed - 07/14/2021  1:08 PM      Failed - Triglycerides in normal range and within 360 days    Triglycerides  Date Value Ref Range Status  03/09/2021 247 (H) 0 - 149 mg/dL Final          Passed - Total Cholesterol in normal range and within 360 days    Cholesterol, Total  Date Value Ref Range Status  03/09/2021 144 100 - 199 mg/dL Final          Passed - LDL in normal range and within 360 days    LDL Chol Calc (NIH)  Date Value Ref Range Status  03/09/2021 63 0 - 99 mg/dL Final          Passed - HDL in normal range and within 360 days    HDL  Date Value Ref Range Status  03/09/2021 41 >39 mg/dL Final           Passed - Patient is not pregnant      Passed - Valid encounter within last 12 months    Recent Outpatient Visits           4 months ago Centrilobular emphysema ([place])   [place], Jolene T, NP   10 months ago Centrilobular emphysema ([place])   [place], Henrine Screws T, NP   1 year ago Centrilobular emphysema ([place])   [place], Barbaraann Faster, NP       Future Appointments             In 3 weeks  [place], [place]   In 1 month Cannady, Barbaraann Faster, NP [facility], [facility]

## 2021-07-15 NOTE — Telephone Encounter (Signed)
Requested Prescriptions  Pending Prescriptions Disp Refills  . dronabinol (MARINOL) 2.5 MG capsule [Pharmacy Med Name: DRONABINOL 2.5 MG CAP] 180 capsule     Sig: TAKE ONE CAPSULE TWICE A DAY BEFORE MEALS     Off-Protocol Failed - 07/14/2021  1:08 PM      Failed - Medication not assigned to a protocol, review manually.      Passed - Valid encounter within last 12 months    Recent Outpatient Visits          4 months ago Centrilobular emphysema ([place])   [place] Cannady, Henrine Screws T, NP   10 months ago Centrilobular emphysema ([place])   [place], Henrine Screws T, NP   1 year ago Centrilobular emphysema ([place])   [place], Barbaraann Faster, NP      Future Appointments            In 3 weeks  [place], [place]   In 1 month Cannady, Barbaraann Faster, NP [facility]           . rosuvastatin (CRESTOR) 10 MG tablet [Pharmacy Med Name: ROSUVASTATIN CALCIUM 10 MG TAB] 90 tablet 2    Sig: TAKE 1 TABLET BY MOUTH DAILY.     Cardiovascular:  Antilipid - Statins Failed - 07/14/2021  1:08 PM      Failed - Triglycerides in normal range and within 360 days    Triglycerides  Date Value Ref Range Status  03/09/2021 247 (H) 0 - 149 mg/dL Final         Passed - Total Cholesterol in normal range and within 360 days    Cholesterol, Total  Date Value Ref Range Status  03/09/2021 144 100 - 199 mg/dL Final         Passed - LDL in normal range and within 360 days    LDL Chol Calc (NIH)  Date Value Ref Range Status  03/09/2021 63 0 - 99 mg/dL Final         Passed - HDL in normal range and within 360 days    HDL  Date Value Ref Range Status  03/09/2021 41 >39 mg/dL Final         Passed - Patient is not pregnant      Passed - Valid encounter within last 12 months    Recent Outpatient Visits          4 months ago Centrilobular emphysema ([place])   [place], Jolene T, NP   10 months ago Centrilobular  emphysema ([place])   [place], Henrine Screws T, NP   1 year ago Centrilobular emphysema ([place])   [place], Barbaraann Faster, NP      Future Appointments            In 3 weeks  [place], [place]   In 1 month Cannady, Barbaraann Faster, NP [facility]

## 2021-07-27 ENCOUNTER — Other Ambulatory Visit: Payer: Self-pay | Admitting: Nurse Practitioner

## 2021-07-28 ENCOUNTER — Other Ambulatory Visit: Payer: Self-pay | Admitting: Nurse Practitioner

## 2021-07-28 NOTE — Telephone Encounter (Signed)
last RF 07/12/20 #90 4 RF too soon

## 2021-07-29 NOTE — Telephone Encounter (Signed)
Requested Prescriptions  Pending Prescriptions Disp Refills  . mirtazapine (REMERON) 15 MG tablet [Pharmacy Med Name: MIRTAZAPINE 15 MG TAB] 90 tablet 0    Sig: TAKE 1 TABLET BY MOUTH AT BEDTIME     Psychiatry: Antidepressants - mirtazapine Failed - 07/28/2021  8:24 AM      Failed - Triglycerides in normal range and within 360 days    Triglycerides  Date Value Ref Range Status  03/09/2021 247 (H) 0 - 149 mg/dL Final         Passed - AST in normal range and within 360 days    AST  Date Value Ref Range Status  03/09/2021 19 0 - 40 IU/L Final         Passed - ALT in normal range and within 360 days    ALT  Date Value Ref Range Status  03/09/2021 11 0 - 32 IU/L Final         Passed - Total Cholesterol in normal range and within 360 days    Cholesterol, Total  Date Value Ref Range Status  03/09/2021 144 100 - 199 mg/dL Final         Passed - WBC in normal range and within 360 days    WBC  Date Value Ref Range Status  03/09/2021 8.3 3.4 - 10.8 x10E3/uL Final  08/29/2018 4.8 4.0 - 10.5 K/uL Final         Passed - Valid encounter within last 6 months    Recent Outpatient Visits          4 months ago Centrilobular emphysema ([place])   [place], Jolene T, NP   10 months ago Centrilobular emphysema ([place])   [place], Henrine Screws T, NP   1 year ago Centrilobular emphysema ([place])   [place], Barbaraann Faster, NP      Future Appointments            In 1 week  [place], [place]   In 1 month Cannady, Barbaraann Faster, NP [facility]

## 2021-07-30 ENCOUNTER — Other Ambulatory Visit: Payer: Self-pay | Admitting: Nurse Practitioner

## 2021-07-31 NOTE — Telephone Encounter (Signed)
Requested medications are due for refill today.  yes  Requested medications are on the active medications list.  yes  Last refill. 09/08/2020  Future visit scheduled.   yes  Notes to clinic.  No protocol assigned.

## 2021-08-06 ENCOUNTER — Ambulatory Visit: Payer: Medicare Other

## 2021-08-16 ENCOUNTER — Ambulatory Visit (INDEPENDENT_AMBULATORY_CARE_PROVIDER_SITE_OTHER): Payer: Medicare Other | Admitting: *Deleted

## 2021-08-16 DIAGNOSIS — Z0000 Encounter for general adult medical examination without abnormal findings: Secondary | ICD-10-CM | POA: Diagnosis not present

## 2021-08-16 NOTE — Progress Notes (Signed)
Subjective:   Lori Rangel is a 60 y.o. female who presents for Medicare Annual (Subsequent) preventive examination.  I connected with  [name] on 08/16/21 by a telephone enabled telemedicine application and verified that I am speaking with the correct person using two identifiers.   I discussed the limitations of evaluation and management by telemedicine. The patient expressed understanding and agreed to proceed.  Patient location: home  Provider location: Tele-Health not in office    Review of Systems     Cardiac Risk Factors include: advanced age (>85men, >66 women);sedentary lifestyle     Objective:    Today's Vitals   There is no height or weight on file to calculate BMI.  Advanced Directives 08/16/2021 08/04/2020 08/27/2018 08/25/2018 08/25/2018 11/03/2017 11/03/2017  Does Patient Have a Medical Advance Directive? No Yes Yes Yes No Yes Yes  Type of Advance Directive - [place];Living will [place];Living will [place];Living will - Living will [place];Living will  Does patient want to make changes to medical advance directive? - - - No - Patient declined - No - Patient declined -  Copy of [employer] in Chart? - No - copy requested No - copy requested No - copy requested - No - copy requested No - copy requested  Would patient like information on creating a medical advance directive? No - Patient declined - No - Patient declined No - Patient declined No - Patient declined No - Patient declined No - Patient declined    Current Medications (verified) Outpatient Encounter Medications as of 08/16/2021  Medication Sig   acetaminophen (TYLENOL) 500 MG tablet Take 500-1,000 mg by mouth every 8 (eight) hours as needed for mild constipation or moderate pain.   ANORO ELLIPTA 62.5-25 MCG/ACT AEPB INHALE 1 PUFF INTO THE LUNGS DAILY AS DIRECTED   aspirin 81 MG EC tablet Take 1  tablet by mouth daily.   cholecalciferol (VITAMIN D3) 25 MCG (1000 UNIT) tablet Take 1,000 Units by mouth daily.   dronabinol (MARINOL) 2.5 MG capsule TAKE ONE CAPSULE TWICE A DAY BEFORE MEALS   FEROSUL 325 (65 Fe) MG tablet TAKE ONE TABLET BY MOUTH TWICE DAILY WITH MEALS. TAKE ALONG WITH THE VITAMIN C FOR ANEMIA   levothyroxine (SYNTHROID) 25 MCG tablet TAKE 1 TABLET EVERY DAY ON EMPTY STOMACHWITH A GLASS OF WATER AT LEAST 30-60 MINBEFORE BREAKFAST   mirtazapine (REMERON) 15 MG tablet TAKE 1 TABLET BY MOUTH AT BEDTIME   Multiple Vitamin (MULTIVITAMIN WITH MINERALS) TABS tablet Take 1 tablet by mouth daily.   omeprazole (PRILOSEC) 20 MG capsule Take 1 capsule (20 mg total) by mouth daily.   rosuvastatin (CRESTOR) 10 MG tablet TAKE 1 TABLET BY MOUTH DAILY.   VENTOLIN HFA 108 (90 Base) MCG/ACT inhaler INHALE TWO PUFFS EVERY SIX HOURS AS NEEDED FOR WHEEZING OR SHORTNESS OF BREATH   vitamin C (VITAMIN C) 250 MG tablet Take 1 tablet (250 mg total) by mouth 2 (two) times daily.   No facility-administered encounter medications on file as of 08/16/2021.    Allergies (verified) Levaquin [levofloxacin]   History: Past Medical History:  Diagnosis Date   Asthma    Cancer ([place])    lung   COPD (chronic obstructive pulmonary disease) (HCC)    Shortness of breath dyspnea    Thyroid disease    Weight decrease major recent weight loss   Past Surgical History:  Procedure Laterality Date   BREAST BIOPSY  Right    COLONOSCOPY     ELECTROMAGNETIC NAVIGATION BROCHOSCOPY N/A 01/02/2016   Procedure: ELECTROMAGNETIC NAVIGATION BRONCHOSCOPY;  Surgeon: Flora Lipps, MD;  Location: [facility] ORS;  Service: Cardiopulmonary;  Laterality: N/A;   ESOPHAGOGASTRODUODENOSCOPY (EGD) WITH PROPOFOL N/A 08/27/2018   Procedure: ESOPHAGOGASTRODUODENOSCOPY (EGD) WITH PROPOFOL;  Surgeon: Lucilla Lame, MD;  Location: [facility] END[facility];  Service: Endoscopy;  Laterality: N/A;   INTRAMEDULLARY (IM) NAIL INTERTROCHANTERIC Right 11/03/2017    Procedure: INTRAMEDULLARY (IM) NAIL INTERTROCHANTRIC;  Surgeon: Leim Fabry, MD;  Location: [facility] ORS;  Service: Orthopedics;  Laterality: Right;   Family History  Problem Relation Age of Onset   Cirrhosis Father    Lung cancer Sister    Social History   Socioeconomic History   Marital status: Widowed    Spouse name: Not on file   Number of children: Not on file   Years of education: Not on file   Highest education level: Not on file  Occupational History   Occupation: disability  Tobacco Use   Smoking status: Every Day    Packs/day: 0.50    Years: 40.00    Pack years: 20.00    Types: Cigarettes   Smokeless tobacco: Never  Vaping Use   Vaping Use: Former   Substances: Nicotine  Substance and Sexual Activity   Alcohol use: Not Currently    Alcohol/week: 0.0 standard drinks   Drug use: No   Sexual activity: Not Currently  Other Topics Concern   Not on file  Social History Narrative   Not on file   Social Determinants of Health   Financial Resource Strain: Low Risk    Difficulty of Paying Living Expenses: Not hard at all  Food Insecurity: No Food Insecurity   Worried About Charity fundraiser in the Last Year: Never true   [place] in the Last Year: Never true  Transportation Needs: No Transportation Needs   Lack of Transportation (Medical): No   Lack of Transportation (Non-Medical): No  Physical Activity: Inactive   Days of Exercise per Week: 0 days   Minutes of Exercise per Session: 0 min  Stress: No Stress Concern Present   Feeling of Stress : Not at all  Social Connections: Socially Isolated   Frequency of Communication with Friends and Family: More than three times a week   Frequency of Social Gatherings with Friends and Family: More than three times a week   Attends Religious Services: Never   Marine scientist or Organizations: No   Attends Archivist Meetings: Never   Marital Status: Widowed    Tobacco Counseling Ready to quit:  Not Answered Counseling given: Not Answered   Clinical Intake:  Pre-visit preparation completed: Yes  Pain : No/denies pain     Nutritional Risks: None Diabetes: No  How often do you need to have someone help you when you read instructions, pamphlets, or other written materials from your doctor or pharmacy?: 1 - Never  Diabetic?no  Interpreter Needed?: No  Information entered by :: Leroy Kennedy LPN   Activities of Daily Living In your present state of health, do you have any difficulty performing the following activities: 08/16/2021  Hearing? N  Vision? N  Difficulty concentrating or making decisions? N  Walking or climbing stairs? N  Dressing or bathing? N  Doing errands, shopping? N  Preparing Food and eating ? N  Using the Toilet? N  In the past six months, have you accidently leaked urine? N  Do you have  problems with loss of bowel control? N  Managing your Medications? N  Managing your Finances? N  Housekeeping or managing your Housekeeping? N  Some recent data might be hidden    Patient Care Team: Venita Lick, NP as PCP - General (Nurse Practitioner) Minna Merritts, MD as Consulting Physician (Cardiology)  Indicate any recent Medical Services you may have received from other than [facility] providers in the past year (date may be approximate).     Assessment:   This is a routine wellness examination for [place].  Hearing/Vision screen Hearing Screening - Comments:: No trouble hearing   Vision Screening - Comments:: Not up to date [place] issues and exercise activities discussed: Current Exercise Habits: The patient does not participate in regular exercise at present, Intensity: Not Applicable   Goals Addressed             This Visit's Progress    Patient Stated       Would like to drink more water       Depression Screen PHQ 2/9 Scores 08/16/2021 09/08/2020 08/04/2020 07/12/2020  PHQ - 2 Score 0 0 0 0    Fall Risk Fall  Risk  08/16/2021 09/08/2020 08/04/2020  Falls in the past year? 0 0 0  Number falls in past yr: 0 - -  Injury with Fall? 0 - -  Risk for fall due to : - Impaired mobility Impaired mobility;Medication side effect  Follow up Falls evaluation completed;Falls prevention discussed - Falls evaluation completed;Education provided;Falls prevention discussed    FALL RISK PREVENTION PERTAINING TO THE HOME:  Any stairs in or around the home? No  If so, are there any without handrails? No  Home free of loose throw rugs in walkways, pet beds, electrical cords, etc? Yes  Adequate lighting in your home to reduce risk of falls? Yes   ASSISTIVE DEVICES UTILIZED TO PREVENT FALLS:  Life alert? No  Use of a cane, walker or w/c? Yes  Grab bars in the bathroom? No  Shower chair or bench in shower? Yes  Elevated toilet seat or a handicapped toilet? No   TIMED UP AND GO:  Was the test performed? No .    Cognitive Function:  Normal cognitive status assessed by direct observation by this Nurse Health Advisor. No abnormalities found.       6CIT Screen 08/04/2020  What Year? 0 points  What month? 0 points  What time? 0 points  Count back from 20 0 points  Months in reverse 4 points  Repeat phrase 6 points  Total Score 10    Immunizations Immunization History  Administered Date(s) Administered   Influenza Inj Mdck Quad Pf 06/03/2018   Influenza Split 07/04/2015   Influenza,inj,Quad PF,6+ Mos 06/03/2018, 06/15/2019   Influenza-Unspecified 06/06/2016, 06/21/2020, 07/18/2021   Moderna Sars-Covid-2 Vaccination 05/27/2020, 06/24/2020   Pneumococcal Polysaccharide-23 09/08/2020    TDAP status: Due, Education has been provided regarding the importance of this vaccine. Advised may receive this vaccine at local pharmacy or Health Dept. Aware to provide a copy of the vaccination record if obtained from local pharmacy or Health Dept. Verbalized acceptance and understanding.  Flu Vaccine status: Up to  date  Pneumococcal vaccine status: Up to date  Covid-19 vaccine status: Information provided on how to obtain vaccines.   Qualifies for Shingles Vaccine? Yes   Zostavax completed No   Shingrix Completed?: No.    Education has been provided regarding the importance of this vaccine. Patient has been advised  to call insurance company to determine out of pocket expense if they have not yet received this vaccine. Advised may also receive vaccine at local pharmacy or Health Dept. Verbalized acceptance and understanding.  Screening Tests Health Maintenance  Topic Date Due   PAP SMEAR-Modifier  Never done   COVID-19 Vaccine (3 - Moderna risk series) 07/22/2020   Pneumococcal Vaccine 69-39 Years old (2 - PCV) 09/08/2021   MAMMOGRAM  09/08/2021 (Originally 10/20/2009)   COLONOSCOPY (Pts 45-22yrs Insurance coverage will need to be confirmed)  09/08/2021 (Originally 10/20/2004)   TETANUS/TDAP  09/08/2021 (Originally 10/20/1978)   Zoster Vaccines- Shingrix (1 of 2) 08/16/2022 (Originally 10/20/1978)   INFLUENZA VACCINE  Completed   Hepatitis C Screening  Completed   HIV Screening  Completed   HPV VACCINES  Aged Out    Health Maintenance  Health Maintenance Due  Topic Date Due   PAP SMEAR-Modifier  Never done   COVID-19 Vaccine (3 - Moderna risk series) 07/22/2020   Pneumococcal Vaccine 74-47 Years old (2 - PCV) 09/08/2021    Colonoscopy declined  Mammogram declined  Bone density declined  Lung Cancer Screening: (Low Dose CT Chest recommended if Age 55-80 years, 30 pack-year currently smoking OR have quit w/in 15years.)   Lung Cancer Screening Referral:   Additional Screening:  Hepatitis C Screening: does not qualify; Completed 2021  Vision Screening: Recommended annual ophthalmology exams for early detection of glaucoma and other disorders of the eye. Is the patient up to date with their annual eye exam?  No  Who is the provider or what is the name of the office in which the patient  attends annual eye exams? [place] If pt is not established with a provider, would they like to be referred to a provider to establish care? No .   Dental Screening: Recommended annual dental exams for proper oral hygiene  Community Resource Referral / Chronic Care Management: CRR required this visit?  No   CCM required this visit?  No      Plan:     I have personally reviewed and noted the following in the patients chart:   Medical and social history Use of alcohol, tobacco or illicit drugs  Current medications and supplements including opioid prescriptions.  Functional ability and status Nutritional status Physical activity Advanced directives List of other physicians Hospitalizations, surgeries, and ER visits in previous 12 months Vitals Screenings to include cognitive, depression, and falls Referrals and appointments  In addition, I have reviewed and discussed with patient certain preventive protocols, quality metrics, and best practice recommendations. A written personalized care plan for preventive services as well as general preventive health recommendations were provided to patient.     Leroy Kennedy, LPN   12/75/1700   Nurse Notes:

## 2021-08-16 NOTE — Patient Instructions (Signed)
[name] , Thank you for taking time to come for your Medicare Wellness Visit. I appreciate your ongoing commitment to your health goals. Please review the following plan we discussed and let me know if I can assist you in the future.   Screening recommendations/referrals: Colonoscopy: Education provided Mammogram: Education provided  Recommended yearly ophthalmology/optometry visit for glaucoma screening and checkup Recommended yearly dental visit for hygiene and checkup  Vaccinations: Influenza vaccine: up to date Pneumococcal vaccine: up to date Tdap vaccine: up to date Shingles vaccine: Education provided    Advanced directives: Education provided  Conditions/risks identified:   Next appointment: 09-19-2021 @ 3:20 [name]   Preventive Care 40-64 Years and Older, Female Preventive care refers to lifestyle choices and visits with your health care provider that can promote health and wellness. What does preventive care include? A yearly physical exam. This is also called an annual well check. Dental exams once or twice a year. Routine eye exams. Ask your health care provider how often you should have your eyes checked. Personal lifestyle choices, including: Daily care of your teeth and gums. Regular physical activity. Eating a healthy diet. Avoiding tobacco and drug use. Limiting alcohol use. Practicing safe sex. Taking low-dose aspirin every day. Taking vitamin and mineral supplements as recommended by your health care provider. What happens during an annual well check? The services and screenings done by your health care provider during your annual well check will depend on your age, overall health, lifestyle risk factors, and family history of disease. Counseling  Your health care provider may ask you questions about your: Alcohol use. Tobacco use. Drug use. Emotional well-being. Home and relationship well-being. Sexual activity. Eating habits. History of  falls. Memory and ability to understand (cognition). Work and work Statistician. Reproductive health. Screening  You may have the following tests or measurements: Height, weight, and BMI. Blood pressure. Lipid and cholesterol levels. These may be checked every 5 years, or more frequently if you are over 10 years old. Skin check. Lung cancer screening. You may have this screening every year starting at age 69 if you have a 30-pack-year history of smoking and currently smoke or have quit within the past 15 years. Fecal occult blood test (FOBT) of the stool. You may have this test every year starting at age 27. Flexible sigmoidoscopy or colonoscopy. You may have a sigmoidoscopy every 5 years or a colonoscopy every 10 years starting at age 22. Hepatitis C blood test. Hepatitis B blood test. Sexually transmitted disease (STD) testing. Diabetes screening. This is done by checking your blood sugar (glucose) after you have not eaten for a while (fasting). You may have this done every 1-3 years. Bone density scan. This is done to screen for osteoporosis. You may have this done starting at age 38. Mammogram. This may be done every 1-2 years. Talk to your health care provider about how often you should have regular mammograms. Talk with your health care provider about your test results, treatment options, and if necessary, the need for more tests. Vaccines  Your health care provider may recommend certain vaccines, such as: Influenza vaccine. This is recommended every year. Tetanus, diphtheria, and acellular pertussis (Tdap, Td) vaccine. You may need a Td booster every 10 years. Zoster vaccine. You may need this after age 34. Pneumococcal 13-valent conjugate (PCV13) vaccine. One dose is recommended after age 9. Pneumococcal polysaccharide (PPSV23) vaccine. One dose is recommended after age 23. Talk to your health care provider about which screenings and vaccines you  need and how often you need  them. This information is not intended to replace advice given to you by your health care provider. Make sure you discuss any questions you have with your health care provider. Document Released: 09/15/2015 Document Revised: 05/08/2016 Document Reviewed: 06/20/2015 Elsevier Interactive Patient Education  2017 [place] Prevention in the Home Falls can cause injuries. They can happen to people of all ages. There are many things you can do to make your home safe and to help prevent falls. What can I do on the outside of my home? Regularly fix the edges of walkways and driveways and fix any cracks. Remove anything that might make you trip as you walk through a door, such as a raised step or threshold. Trim any bushes or trees on the path to your home. Use bright outdoor lighting. Clear any walking paths of anything that might make someone trip, such as rocks or tools. Regularly check to see if handrails are loose or broken. Make sure that both sides of any steps have handrails. Any raised decks and porches should have guardrails on the edges. Have any leaves, snow, or ice cleared regularly. Use sand or salt on walking paths during winter. Clean up any spills in your garage right away. This includes oil or grease spills. What can I do in the bathroom? Use night lights. Install grab bars by the toilet and in the tub and shower. Do not use towel bars as grab bars. Use non-skid mats or decals in the tub or shower. If you need to sit down in the shower, use a plastic, non-slip stool. Keep the floor dry. Clean up any water that spills on the floor as soon as it happens. Remove soap buildup in the tub or shower regularly. Attach bath mats securely with double-sided non-slip rug tape. Do not have throw rugs and other things on the floor that can make you trip. What can I do in the bedroom? Use night lights. Make sure that you have a light by your bed that is easy to reach. Do not use  any sheets or blankets that are too big for your bed. They should not hang down onto the floor. Have a firm chair that has side arms. You can use this for support while you get dressed. Do not have throw rugs and other things on the floor that can make you trip. What can I do in the kitchen? Clean up any spills right away. Avoid walking on wet floors. Keep items that you use a lot in easy-to-reach places. If you need to reach something above you, use a strong step stool that has a grab bar. Keep electrical cords out of the way. Do not use floor polish or wax that makes floors slippery. If you must use wax, use non-skid floor wax. Do not have throw rugs and other things on the floor that can make you trip. What can I do with my stairs? Do not leave any items on the stairs. Make sure that there are handrails on both sides of the stairs and use them. Fix handrails that are broken or loose. Make sure that handrails are as long as the stairways. Check any carpeting to make sure that it is firmly attached to the stairs. Fix any carpet that is loose or worn. Avoid having throw rugs at the top or bottom of the stairs. If you do have throw rugs, attach them to the floor with carpet tape. Make sure that  you have a light switch at the top of the stairs and the bottom of the stairs. If you do not have them, ask someone to add them for you. What else can I do to help prevent falls? Wear shoes that: Do not have high heels. Have rubber bottoms. Are comfortable and fit you well. Are closed at the toe. Do not wear sandals. If you use a stepladder: Make sure that it is fully opened. Do not climb a closed stepladder. Make sure that both sides of the stepladder are locked into place. Ask someone to hold it for you, if possible. Clearly mark and make sure that you can see: Any grab bars or handrails. First and last steps. Where the edge of each step is. Use tools that help you move around (mobility aids)  if they are needed. These include: Canes. Walkers. Scooters. Crutches. Turn on the lights when you go into a dark area. Replace any light bulbs as soon as they burn out. Set up your furniture so you have a clear path. Avoid moving your furniture around. If any of your floors are uneven, fix them. If there are any pets around you, be aware of where they are. Review your medicines with your doctor. Some medicines can make you feel dizzy. This can increase your chance of falling. Ask your doctor what other things that you can do to help prevent falls. This information is not intended to replace advice given to you by your health care provider. Make sure you discuss any questions you have with your health care provider. Document Released: 06/15/2009 Document Revised: 01/25/2016 Document Reviewed: 09/23/2014 Elsevier Interactive Patient Education  2017 [employer].

## 2021-08-22 ENCOUNTER — Other Ambulatory Visit: Payer: Self-pay | Admitting: Nurse Practitioner

## 2021-08-22 NOTE — Telephone Encounter (Signed)
Requested Prescriptions  Pending Prescriptions Disp Refills   omeprazole (PRILOSEC) 20 MG capsule [Pharmacy Med Name: OMEPRAZOLE DR 20 MG CAP] 90 capsule 0    Sig: TAKE 1 CAPSULE BY MOUTH ONCE DAILY     Gastroenterology: Proton Pump Inhibitors Passed - 08/22/2021  5:33 PM      Passed - Valid encounter within last 12 months    Recent Outpatient Visits          5 months ago Centrilobular emphysema ([place])   [place] Cannady, Jolene T, NP   11 months ago Centrilobular emphysema ([place])   Garber, Henrine Screws T, NP   1 year ago Centrilobular emphysema ([place])   Stephenson, Barbaraann Faster, NP      Future Appointments            In 4 weeks Cannady, Barbaraann Faster, NP [employer]

## 2021-09-10 ENCOUNTER — Ambulatory Visit: Payer: Medicare Other | Admitting: Nurse Practitioner

## 2021-09-19 ENCOUNTER — Encounter: Payer: Self-pay | Admitting: Nurse Practitioner

## 2021-09-19 ENCOUNTER — Other Ambulatory Visit: Payer: Self-pay

## 2021-09-19 ENCOUNTER — Ambulatory Visit (INDEPENDENT_AMBULATORY_CARE_PROVIDER_SITE_OTHER): Payer: Medicare Other | Admitting: Nurse Practitioner

## 2021-09-19 VITALS — BP 132/70 | HR 81 | Temp 98.5°F | Ht <= 58 in | Wt 101.6 lb

## 2021-09-19 DIAGNOSIS — E782 Mixed hyperlipidemia: Secondary | ICD-10-CM

## 2021-09-19 DIAGNOSIS — Z85118 Personal history of other malignant neoplasm of bronchus and lung: Secondary | ICD-10-CM

## 2021-09-19 DIAGNOSIS — E440 Moderate protein-calorie malnutrition: Secondary | ICD-10-CM | POA: Diagnosis not present

## 2021-09-19 DIAGNOSIS — E039 Hypothyroidism, unspecified: Secondary | ICD-10-CM

## 2021-09-19 DIAGNOSIS — Z5181 Encounter for therapeutic drug level monitoring: Secondary | ICD-10-CM

## 2021-09-19 DIAGNOSIS — F17219 Nicotine dependence, cigarettes, with unspecified nicotine-induced disorders: Secondary | ICD-10-CM

## 2021-09-19 DIAGNOSIS — R399 Unspecified symptoms and signs involving the genitourinary system: Secondary | ICD-10-CM

## 2021-09-19 DIAGNOSIS — J432 Centrilobular emphysema: Secondary | ICD-10-CM

## 2021-09-19 DIAGNOSIS — Z1231 Encounter for screening mammogram for malignant neoplasm of breast: Secondary | ICD-10-CM

## 2021-09-19 DIAGNOSIS — Z1211 Encounter for screening for malignant neoplasm of colon: Secondary | ICD-10-CM

## 2021-09-19 DIAGNOSIS — I700 Atherosclerosis of aorta: Secondary | ICD-10-CM

## 2021-09-19 DIAGNOSIS — D508 Other iron deficiency anemias: Secondary | ICD-10-CM

## 2021-09-19 DIAGNOSIS — K219 Gastro-esophageal reflux disease without esophagitis: Secondary | ICD-10-CM | POA: Insufficient documentation

## 2021-09-19 LAB — URINALYSIS, ROUTINE W REFLEX MICROSCOPIC
Bilirubin, UA: NEGATIVE
Glucose, UA: NEGATIVE
Leukocytes,UA: NEGATIVE
Nitrite, UA: NEGATIVE
Protein,UA: NEGATIVE
RBC, UA: NEGATIVE
Specific Gravity, UA: 1.01 (ref 1.005–1.030)
Urobilinogen, Ur: 0.2 mg/dL (ref 0.2–1.0)
pH, UA: 6 (ref 5.0–7.5)

## 2021-09-19 MED ORDER — MIRTAZAPINE 15 MG PO TABS: 15.0000 mg | ORAL_TABLET | Freq: Every day | ORAL | 4 refills | Status: DC

## 2021-09-19 MED ORDER — IRON-VITAMIN C 65-125 MG PO TABS: 1.0000 | ORAL_TABLET | Freq: Two times a day (BID) | ORAL | 4 refills | Status: DC

## 2021-09-19 NOTE — Assessment & Plan Note (Signed)
[facility] oncology follows -- presented to upper left lobe in 2019 -- had partial lobectomy and has every 12 months scans.  Continue this collaboration.

## 2021-09-19 NOTE — Assessment & Plan Note (Signed)
Noted on past CXR in 2017.  Recommend complete cessation of smoking.  Continue statin at low dose daily and Baby ASA for preventative care.

## 2021-09-19 NOTE — Assessment & Plan Note (Signed)
Chronic, ongoing.  Continue current medication regimen and adjust as needed. Lipid panel today. 

## 2021-09-19 NOTE — Assessment & Plan Note (Signed)
I have recommended complete cessation of tobacco use. I have discussed various options available for assistance with tobacco cessation including over the counter methods (Nicotine gum, patch and lozenges). We also discussed prescription options (Chantix, Nicotine Inhaler / Nasal Spray). The patient is not interested in pursuing any prescription tobacco cessation options at this time.  

## 2021-09-19 NOTE — Assessment & Plan Note (Signed)
Chronic, ongoing stable with Omeprazole.  Has tried reduction in past with return of symptoms. Continue current medication regimen and adjust as needed.  Mag level annually.

## 2021-09-19 NOTE — Assessment & Plan Note (Signed)
Chronic, ongoing.  Will continue current regimen at this time and adjust as needed based on labs.  TSH and Free T4 up to date and stable.

## 2021-09-19 NOTE — Assessment & Plan Note (Signed)
Ongoing, continues on daily iron and Vitamin C -- will change this to Vitron to minimize current pill burden.  Will continue this regimen and obtain labs today to include CBC.

## 2021-09-19 NOTE — Assessment & Plan Note (Signed)
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  She is tolerating Anoro well and notices improved function with this, continues minimally using Ventolin.  Discussed with her if progression can consider transition to Trelegy in future and possibly a pulmonary referral.  Recommend complete cessation of smoking. Return in 6 months for follow-up.  CBC today.  Repeat spirometry at physical.

## 2021-09-19 NOTE — Patient Instructions (Signed)
Please call to schedule your mammogram and/or bone density: [facility] at [facility]  Address: [address], [place], [place] [address]  Phone: [phone]   Mammogram A mammogram is an X-ray of the breasts. This is done to check for changes that are not normal. This test can look for changes that may be caused by breast cancer or other problems. Mammograms are regularly done on women beginning at age 62. A man may have a mammogram if he has a lump or swelling in his breast. Tell a doctor: About any allergies you have. If you have breast implants. If you have had breast disease, biopsy, or surgery. If you have a family history of breast cancer. If you are breastfeeding. Whether you are pregnant or may be pregnant. What are the risks? Generally, this is a safe procedure. But problems may occur, including: Being exposed to radiation. Radiation levels are very low with this test. The need for more tests. The results were not read properly. Trouble finding breast cancer in women with dense breasts. What happens before the test? Have this test done about 1-2 weeks after your menstrual period. This is often when your breasts are the least tender. If you are visiting a new doctor or clinic, have any past mammogram images sent to your new doctor's office. Wash your breasts and under your arms on the day of the test. Do not use deodorants, perfumes, lotions, or powders on the day of the test. Take off any jewelry from your neck. Wear clothes that you can change into and out of easily. What happens during the test?  You will take off your clothes from the waist up. You will put on a gown. You will stand in front of the X-ray machine. Each breast will be placed between two plastic or glass plates. The plates will press down on your breast for a few seconds. Try to relax. This does not cause any harm to your breasts. It may not feel comfortable, but it will be very  brief. X-rays will be taken from different angles of each breast. The procedure may vary among doctors and hospitals. What can I expect after the test? The mammogram will be read by a specialist (radiologist). You may need to do parts of the test again. This depends on the quality of the images. You may go back to your normal activities. It is up to you to get the results of your test. Ask how to get your results when they are ready. Summary A mammogram is an X-ray of the breasts. It looks for changes that may be caused by breast cancer or other problems. A man may have this test if he has a lump or swelling in his breast. Before the test, tell your doctor about any breast problems that you have had in the past. Have this test done about 1-2 weeks after your menstrual period. Ask when your test results will be ready. Make sure you get your test results. This information is not intended to replace advice given to you by your health care provider. Make sure you discuss any questions you have with your health care provider. Document Revised: 05/02/2021 Document Reviewed: 06/19/2020 Elsevier Patient Education  2022 [employer].

## 2021-09-19 NOTE — Progress Notes (Signed)
BP 132/70    Pulse 81    Temp 98.5 F (36.9 C) (Oral)    Ht 4\' 6"  (1.372 m)    Wt 101 lb 9.6 oz (46.1 kg)    SpO2 97%    BMI 24.50 kg/m    Subjective:    Patient ID: Lori Rangel, female    DOB: 1961/05/30, 61 y.o.   MRN: [id number]  HPI: Lori Rangel is a 61 y.o. female  Chief Complaint  Patient presents with   Hypothyroidism   Hyperlipidemia   Hypertension   Gastroesophageal Reflux   Emphysema   Urinary Tract Infection    Patient states she denies having any pain with urination. Patient states she is having urinary retention. Patient states her symptoms started on Sunday. Patient states she has tried Cranberry juice. Patient states she does feel a lot better.    LUNG CA LEFT UPPER LOBE + EMPHYSEMA: Follows with [facility] and last seen 11/28/20 -- she is on yearly visits -- goes for imaging first and then provider visit in March.  Continues Anoro daily, which she reports benefit from.  Does have history of thoracic aortic atherosclerosis noted on imaging.  Has been 3 years cancer free -- took out part of her upper lobe left side.   Is a current every day smoker -- smokes 1/2 PPD -- not interested in quitting at this time.  Used to smoke 1 1/2 PPD to 2 PPD. Started smoking at age 24.  She takes Mirtazapine and Marinol for weight, as is underweight post cancer treatment, this was started by [facility]. COPD status: stable Satisfied with current treatment?: yes Oxygen use: no Dyspnea frequency: none Cough frequency: rarely -- more if active Rescue inhaler frequency: very rarely Limitation of activity: no Productive cough:  none Last Spirometry: January 2022 Pneumovax: up to date Influenza: yes  HYPERLIPIDEMIA Continues on Crestor 10 MG daily.   Hyperlipidemia status: good compliance Satisfied with current treatment?  yes Side effects:  no Medication compliance: good compliance Supplements: none Aspirin:  yes The 10-year ASCVD risk score (Arnett DK, et al., 2019) is: 7.5%    Values used to calculate the score:     Age: 33 years     Sex: Female     Is Non-Hispanic African American: No     Diabetic: No     Tobacco smoker: Yes     Systolic Blood Pressure: 585 mmHg     Is BP treated: No     HDL Cholesterol: 41 mg/dL     Total Cholesterol: 144 mg/dL Chest pain:  no Coronary artery disease:  no Family history CAD:  yes in aunt Family history early CAD:  no   HYPOTHYROIDISM Continues on Levothyroxine 25 MCG daily.   Thyroid control status:stable Satisfied with current treatment? yes Medication side effects: no Medication compliance: good compliance Etiology of hypothyroidism:  Recent dose adjustment:no Fatigue: no Cold intolerance: no Heat intolerance: no Weight gain: no Weight loss: no Constipation: no Diarrhea/loose stools: no Palpitations: no Lower extremity edema: no Anxiety/depressed mood: no   GERD Continues Omeprazole daily.  Has tried to come off of this, but started back with heart burn. GERD control status: stable Satisfied with current treatment? yes Heartburn frequency: none Medication side effects: no  Medication compliance: stable Previous GERD medications: none Dysphagia: no Odynophagia:  no Hematemesis: no Blood in stool: no EGD: no   ANEMIA Continues on iron daily with Vitamin C tablet. Anemia status: stable Etiology of anemia: diet and  post cancer treatment Duration of anemia treatment: chronic Compliance with treatment: good compliance Iron supplementation side effects: no Severity of anemia: mild Fatigue: no Decreased exercise tolerance: no  Dyspnea on exertion: no Palpitations: no Bleeding: no Pica: no   URINARY SYMPTOMS Started on Sunday morning with dribble of urination only.   Dysuria: no Urinary frequency: no Urgency: no Small volume voids: no Symptom severity: no Urinary incontinence: no Foul odor: no Hematuria: no Abdominal pain: no Back pain: no Suprapubic pain/pressure: no Flank pain:  no Fever:  no Vomiting: no Relief with cranberry juice: yes Status: better Previous urinary tract infection: no Recurrent urinary tract infection: no Sexual activity: No sexually active Treatments attempted: cranberry juice and increased hydration    Relevant past medical, surgical, family and social history reviewed and updated as indicated. Interim medical history since our last visit reviewed. Allergies and medications reviewed and updated.  Review of Systems  Constitutional:  Negative for activity change, appetite change, diaphoresis, fatigue and fever.  Respiratory:  Negative for cough, chest tightness, shortness of breath and wheezing.   Cardiovascular:  Negative for chest pain, palpitations and leg swelling.  Gastrointestinal: Negative.   Endocrine: Negative for cold intolerance and heat intolerance.  Genitourinary: Negative.   Neurological: Negative.   Psychiatric/Behavioral: Negative.     Per HPI unless specifically indicated above     Objective:    BP 132/70    Pulse 81    Temp 98.5 F (36.9 C) (Oral)    Ht 4\' 6"  (1.372 m)    Wt 101 lb 9.6 oz (46.1 kg)    SpO2 97%    BMI 24.50 kg/m   Wt Readings from Last 3 Encounters:  09/19/21 101 lb 9.6 oz (46.1 kg)  03/09/21 102 lb 12.8 oz (46.6 kg)  09/08/20 104 lb 3.2 oz (47.3 kg)    Physical Exam Vitals and nursing note reviewed.  Constitutional:      General: She is awake. She is not in acute distress.    Appearance: She is well-developed, well-groomed and underweight. She is not ill-appearing.     Comments: Frail appearing, using walker for mobility.  HENT:     Head: Normocephalic.     Right Ear: Hearing normal.     Left Ear: Hearing normal.  Eyes:     General: Lids are normal.        Right eye: No discharge.        Left eye: No discharge.     Conjunctiva/sclera: Conjunctivae normal.     Pupils: Pupils are equal, round, and reactive to light.  Neck:     Thyroid: No thyromegaly.     Vascular: No carotid bruit.   Cardiovascular:     Rate and Rhythm: Normal rate and regular rhythm.     Heart sounds: Normal heart sounds. No murmur heard.   No gallop.  Pulmonary:     Effort: Pulmonary effort is normal. No accessory muscle usage or respiratory distress.     Breath sounds: Decreased breath sounds present.     Comments: Diminished throughout and clear, no wheezes. Abdominal:     General: Bowel sounds are normal.     Palpations: Abdomen is soft.  Musculoskeletal:     Cervical back: Normal range of motion and neck supple.     Right lower leg: No edema.     Left lower leg: No edema.  Lymphadenopathy:     Cervical: No cervical adenopathy.  Skin:    General: Skin is warm and  dry.  Neurological:     Mental Status: She is alert and oriented to person, place, and time.  Psychiatric:        Attention and Perception: Attention normal.        Mood and Affect: Mood normal.        Speech: Speech normal.        Behavior: Behavior normal. Behavior is cooperative.        Thought Content: Thought content normal.   Results for orders placed or performed in visit on 09/19/21  Urinalysis, Routine w reflex microscopic  Result Value Ref Range   Specific Gravity, UA 1.010 1.005 - 1.030   pH, UA 6.0 5.0 - 7.5   Color, UA Yellow Yellow   Appearance Ur Clear Clear   Leukocytes,UA Negative Negative   Protein,UA Negative Negative/Trace   Glucose, UA Negative Negative   Ketones, UA Trace (A) Negative   RBC, UA Negative Negative   Bilirubin, UA Negative Negative   Urobilinogen, Ur 0.2 0.2 - 1.0 mg/dL   Nitrite, UA Negative Negative      Assessment & Plan:   Problem List Items Addressed This Visit       Cardiovascular and Mediastinum   Thoracic aortic atherosclerosis ([place])    Noted on past CXR in 2017.  Recommend complete cessation of smoking.  Continue statin at low dose daily and Baby ASA for preventative care.      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  She is tolerating Anoro well and notices improved function with this, continues minimally using Ventolin.  Discussed with her if progression can consider transition to Trelegy in future and possibly a pulmonary referral.  Recommend complete cessation of smoking. Return in 6 months for follow-up.  CBC today.  Repeat spirometry at physical.      Relevant Orders   CBC with Differential/Platelet     Digestive   GERD without esophagitis    Chronic, ongoing stable with Omeprazole.  Has tried reduction in past with return of symptoms. Continue current medication regimen and adjust as needed.  Mag level annually.      Relevant Orders   Magnesium     Endocrine   Hypothyroidism    Chronic, ongoing.  Will continue current regimen at this time and adjust as needed based on labs.  TSH and Free T4 up to date and stable.        Nervous and Auditory   Nicotine dependence, cigarettes, w unsp disorders    I have recommended complete cessation of tobacco use. I have discussed various options available for assistance with tobacco cessation including over the counter methods (Nicotine gum, patch and lozenges). We also discussed prescription options (Chantix, Nicotine Inhaler / Nasal Spray). The patient is not interested in pursuing any prescription tobacco cessation options at this time.         Other   History of lung cancer    [facility] oncology follows -- presented to upper left lobe in 2019 -- had partial lobectomy and has every 12 months scans.  Continue this collaboration.      Relevant Orders   [id number] 11+Oxyco+Alc+Crt-Bund   Iron deficiency anemia secondary to inadequate dietary iron intake    Ongoing, continues on  daily iron and Vitamin C -- will change this to Vitron to minimize  current pill burden.  Will continue this regimen and obtain labs today to include CBC.      Relevant Medications   Iron-Vitamin C 65-125 MG TABS   Medication monitoring encounter    Takes Dronabinol for appetite stimulant due to past lung CA and weight loss, continue regimen.  Obtain UDS today.      Relevant Orders   [id number] 11+Oxyco+Alc+Crt-Bund   Mixed hyperlipidemia    Chronic, ongoing.  Continue current medication regimen and adjust as needed.  Lipid panel today.      Relevant Orders   Comprehensive metabolic panel   Lipid Panel w/o Chol/HDL Ratio   Moderate protein-calorie malnutrition ([place])    Ongoing issue, post lung CA.  Will continue Mirtazapine and Dronabinol, as currently prescribed.  This is offering benefit to her appetite and maintaining weight.  She is aware Dronabinol is a controlled substance and will require yearly drug screen and controlled substance contract, which she is agreeable to. UDS today and contract next visit.      Relevant Orders   Comprehensive metabolic panel   Other Visit Diagnoses     Urinary symptom or sign       UA with only trace Ketones noted today -- overall reassuring, discussed with patient.  Continue cranberry juice and hydration.   Relevant Orders   Urinalysis, Routine w reflex microscopic (Completed)   Colon cancer screening       Cologuard ordered   Relevant Orders   Cologuard   Encounter for screening mammogram for malignant neoplasm of breast       Mammogram ordered and recommend they schedule.   Relevant Orders   MM 3D SCREEN BREAST BILATERAL        Follow up plan: Return in about 6 months (around 03/19/2022) for COPD, LUNG CA, HLD, GERD, THYROID -- pap + tetanus + spirometry.

## 2021-09-19 NOTE — Assessment & Plan Note (Addendum)
Ongoing issue, post lung CA.  Will continue Mirtazapine and Dronabinol, as currently prescribed.  This is offering benefit to her appetite and maintaining weight.  She is aware Dronabinol is a controlled substance and will require yearly drug screen and controlled substance contract, which she is agreeable to. UDS today and contract next visit.

## 2021-09-19 NOTE — Assessment & Plan Note (Signed)
Takes Dronabinol for appetite stimulant due to past lung CA and weight loss, continue regimen.  Obtain UDS today.

## 2021-09-20 LAB — COMPREHENSIVE METABOLIC PANEL
ALT: 11 IU/L (ref 0–32)
AST: 20 IU/L (ref 0–40)
Albumin/Globulin Ratio: 2.3 — ABNORMAL HIGH (ref 1.2–2.2)
Albumin: 4.8 g/dL (ref 3.8–4.8)
Alkaline Phosphatase: 112 IU/L (ref 44–121)
BUN/Creatinine Ratio: 11 — ABNORMAL LOW (ref 12–28)
BUN: 7 mg/dL — ABNORMAL LOW (ref 8–27)
Bilirubin Total: <0.2 mg/dL (ref 0.0–1.2)
CO2: 26 mmol/L (ref 20–29)
Calcium: 9.8 mg/dL (ref 8.7–10.3)
Chloride: 97 mmol/L (ref 96–106)
Creatinine, Ser: 0.62 mg/dL (ref 0.57–1.00)
Globulin, Total: 2.1 g/dL (ref 1.5–4.5)
Glucose: 89 mg/dL (ref 70–99)
Potassium: 3.9 mmol/L (ref 3.5–5.2)
Sodium: 137 mmol/L (ref 134–144)
Total Protein: 6.9 g/dL (ref 6.0–8.5)
eGFR: 101 mL/min/{1.73_m2} (ref >59)

## 2021-09-20 LAB — LIPID PANEL W/O CHOL/HDL RATIO
Cholesterol, Total: 169 mg/dL (ref 100–199)
HDL: 49 mg/dL (ref >39)
LDL Chol Calc (NIH): 93 mg/dL (ref 0–99)
Triglycerides: 153 mg/dL — ABNORMAL HIGH (ref 0–149)
VLDL Cholesterol Cal: 27 mg/dL (ref 5–40)

## 2021-09-20 LAB — CBC WITH DIFFERENTIAL/PLATELET
Basophils Absolute: 0.1 10*3/uL (ref 0.0–0.2)
Basos: 1 %
EOS (ABSOLUTE): 0.1 10*3/uL (ref 0.0–0.4)
Eos: 1 %
Hematocrit: 40.3 % (ref 34.0–46.6)
Hemoglobin: 14.4 g/dL (ref 11.1–15.9)
Immature Grans (Abs): 0 10*3/uL (ref 0.0–0.1)
Immature Granulocytes: 0 %
Lymphocytes Absolute: 2.5 10*3/uL (ref 0.7–3.1)
Lymphs: 31 %
MCH: 33.8 pg — ABNORMAL HIGH (ref 26.6–33.0)
MCHC: 35.7 g/dL (ref 31.5–35.7)
MCV: 95 fL (ref 79–97)
Monocytes Absolute: 0.4 10*3/uL (ref 0.1–0.9)
Monocytes: 5 %
Neutrophils Absolute: 5 10*3/uL (ref 1.4–7.0)
Neutrophils: 62 %
Platelets: 297 10*3/uL (ref 150–450)
RBC: 4.26 x10E6/uL (ref 3.77–5.28)
RDW: 11.5 % — ABNORMAL LOW (ref 11.7–15.4)
WBC: 8 10*3/uL (ref 3.4–10.8)

## 2021-09-20 LAB — MAGNESIUM: Magnesium: 1.8 mg/dL (ref 1.6–2.3)

## 2021-09-20 NOTE — Progress Notes (Signed)
Good afternoon crew, please let patient know labs have returned:  - Kidney and liver function remain stable. - Continues Rosuvastatin, as cholesterol levels remain stable with this. - Anemia remains controlled with iron supplement, continue this. - Magnesium level normal.  All great news!!   Keep being stellar!!  Thank you for allowing me to participate in your care.  I appreciate you. Kindest regards, Gonzalo Waymire

## 2021-09-25 LAB — DRUG SCREEN 764883 11+OXYCO+ALC+CRT-BUND
Amphetamines, Urine: NEGATIVE ng/mL
BENZODIAZ UR QL: NEGATIVE ng/mL
Barbiturate: NEGATIVE ng/mL
Cocaine (Metabolite): NEGATIVE ng/mL
Creatinine: 16.7 mg/dL — ABNORMAL LOW (ref 20.0–300.0)
Ethanol: NEGATIVE %
Meperidine: NEGATIVE ng/mL
Methadone Screen, Urine: NEGATIVE ng/mL
OPIATE SCREEN URINE: NEGATIVE ng/mL
Oxycodone/Oxymorphone, Urine: NEGATIVE ng/mL
Phencyclidine: NEGATIVE ng/mL
Propoxyphene: NEGATIVE ng/mL
Tramadol: NEGATIVE ng/mL
pH, Urine: 6.5 (ref 4.5–8.9)

## 2021-09-25 LAB — SPECIFIC GRAVITY: Specific Gravity: 1.003

## 2021-09-25 LAB — CANNABINOID CONFIRMATION, UR
CANNABINOIDS: POSITIVE — AB
Carboxy THC GC/MS Conf: 15 ng/mL

## 2021-10-10 LAB — COLOGUARD: COLOGUARD: NEGATIVE

## 2021-10-10 NOTE — Progress Notes (Signed)
Please let Lori Rangel know her Cologuard is negative.

## 2021-11-19 ENCOUNTER — Other Ambulatory Visit: Payer: Self-pay | Admitting: Nurse Practitioner

## 2021-11-20 NOTE — Telephone Encounter (Signed)
Requested Prescriptions  ?Pending Prescriptions Disp Refills  ?? omeprazole (PRILOSEC) 20 MG capsule [Pharmacy Med Name: OMEPRAZOLE DR 20 MG CAP] 90 capsule 0  ?  Sig: TAKE 1 CAPSULE BY MOUTH ONCE DAILY  ?  ? Gastroenterology: Proton Pump Inhibitors Passed - 11/19/2021  9:54 AM  ?  ?  Passed - Valid encounter within last 12 months  ?  Recent Outpatient Visits   ?      ? 2 months ago Centrilobular emphysema ([place])  ? [place], [place] T, NP  ? 8 months ago Centrilobular emphysema ([place])  ? [place], [place] T, NP  ? 1 year ago Centrilobular emphysema ([place])  ? [place], [place] T, NP  ? 1 year ago Centrilobular emphysema ([place])  ? [facility] Kerrick, Henrine Screws T, NP  ?  ?  ?Future Appointments   ?        ? In 3 months Cannady, Barbaraann Faster, NP [facility] [facility]  ?  ? ?  ?  ?  ? ?

## 2022-01-16 ENCOUNTER — Other Ambulatory Visit: Payer: Self-pay | Admitting: Nurse Practitioner

## 2022-01-17 NOTE — Telephone Encounter (Signed)
Requested medication (s) are due for refill today: yes  Requested medication (s) are on the active medication list: yes  Last refill:  07/16/21 #180 0 refills  Future visit scheduled: yes in 2 months  Notes to clinic:  medication not assigned to a protocol. Do you want to refill Rx?     Requested Prescriptions  Pending Prescriptions Disp Refills   dronabinol (MARINOL) 2.5 MG capsule [Pharmacy Med Name: DRONABINOL 2.5 MG CAP] 180 capsule     Sig: TAKE ONE CAPSULE TWICE A DAY BEFORE MEALS     Off-Protocol Failed - 01/16/2022  5:52 PM      Failed - Medication not assigned to a protocol, review manually.      Passed - Valid encounter within last 12 months    Recent Outpatient Visits           4 months ago Centrilobular emphysema ([place])   [place] Cannady, Henrine Screws T, NP   10 months ago Centrilobular emphysema ([place])   [place], Henrine Screws T, NP   1 year ago Centrilobular emphysema ([place])   [place], Barbaraann Faster, NP   1 year ago Centrilobular emphysema ([place])   [place], Barbaraann Faster, NP       Future Appointments             In 2 months Cannady, Barbaraann Faster, NP [facility]

## 2022-02-15 ENCOUNTER — Other Ambulatory Visit: Payer: Self-pay | Admitting: Nurse Practitioner

## 2022-02-15 NOTE — Telephone Encounter (Signed)
Requested Prescriptions  Pending Prescriptions Disp Refills  . omeprazole (PRILOSEC) 20 MG capsule [Pharmacy Med Name: OMEPRAZOLE DR 20 MG CAP] 90 capsule 0    Sig: TAKE 1 CAPSULE BY MOUTH ONCE DAILY     Gastroenterology: Proton Pump Inhibitors Passed - 02/15/2022  3:21 PM      Passed - Valid encounter within last 12 months    Recent Outpatient Visits          4 months ago Centrilobular emphysema ([place])   [place] Cannady, Jolene T, NP   11 months ago Centrilobular emphysema ([place])   [place], Henrine Screws T, NP   1 year ago Centrilobular emphysema ([place])   [place], Henrine Screws T, NP   1 year ago Centrilobular emphysema ([place])   [place], Barbaraann Faster, NP      Future Appointments            In 1 month Cannady, Barbaraann Faster, NP [facility]

## 2022-02-28 ENCOUNTER — Telehealth: Payer: Self-pay

## 2022-02-28 NOTE — Telephone Encounter (Signed)
Copied from [place]. Topic: General - Other >> Feb 28, 2022 12:58 PM Leitha Schuller wrote: Reason for CRM: Patient Daughter states insurance will no longer over cost for [employer] 62.5-25 MCG/ACT AEPB and pharmacy has sent provider a list of alternative inhalers  Patient's daughter requesting a cb to provider's recommendation on best alternative   Routing to CMA and provider.

## 2022-02-28 NOTE — Telephone Encounter (Signed)
Pt's daughter called back for status update, states the patient is completely out of current supply.   Best contact: [phone]

## 2022-03-01 ENCOUNTER — Telehealth: Payer: Self-pay | Admitting: Nurse Practitioner

## 2022-03-01 MED ORDER — BEVESPI AEROSPHERE 9-4.8 MCG/ACT IN AERO: 2.0000 | INHALATION_SPRAY | Freq: Every day | RESPIRATORY_TRACT | 12 refills | Status: DC

## 2022-03-01 NOTE — Telephone Encounter (Signed)
Spoke with patient and let her know what Jolene said. She said she understands that but they wont get the inhaler in until Monday and needs something to hold her over until Monday. Spoke with Jolene and she wrote out a sample of Bretri for patient. Manuela Schwartz will come and pick it up later today.

## 2022-03-01 NOTE — Telephone Encounter (Signed)
Copied from [place] [id number]. Topic: General - Inquiry >> Mar 01, 2022 10:54 AM Erskine Squibb wrote: Reason for CRM: The patient daughter Lori Rangel called in indicating that the insurance no longer covers the cost for the inhaler Anoro/Ellipta and that the pharmacist has sent the provider a list of alternative inhalers The daughter wants to know if you have a sample in the office for over the weekend of anything else she can use for over the weekend until she can get a prescription for an alternative. Please assist further.

## 2022-03-16 NOTE — Patient Instructions (Incomplete)
Please call to schedule your mammogram and/or bone density: [facility] [place]: [address], [place], [place] [address] Phone: [phone]   COPD and Physical Activity Chronic obstructive pulmonary disease (COPD) is a long-term, or chronic, condition that affects the lungs. COPD is a general term that can be used to describe many problems that cause inflammation of the lungs and limit airflow. These conditions include chronic bronchitis and emphysema. The main symptom of COPD is shortness of breath, which makes it harder to do even simple tasks. This can also make it harder to exercise and stay active. Talk with your health care provider about treatments to help you breathe better and actions you can take to prevent breathing problems during physical activity. What are the benefits of exercising when you have COPD? Exercising regularly is an important part of a healthy lifestyle. You can still exercise and do physical activities even though you have COPD. Exercise and physical activity improve your shortness of breath by increasing blood flow (circulation). This causes your heart to pump more oxygen through your body. Moderate exercise can: Improve oxygen use. Increase your energy level. Help with shortness of breath. Strengthen your breathing muscles. Improve heart health. Help with sleep. Improve your self-esteem and feelings of self-worth. Lower depression, stress, and anxiety. Exercise can benefit everyone with COPD. The severity of your disease may affect how hard you can exercise, especially at first, but everyone can benefit. Talk with your health care provider about how much exercise is safe for you, and which activities and exercises are safe for you. What actions can I take to prevent breathing problems during physical activity? Sign up for a pulmonary rehabilitation program. This type of program may include: Education about lung  diseases. Exercise classes that teach you how to exercise and be more active while improving your breathing. This usually involves: Exercise using your lower extremities, such as a stationary bicycle. About 30 minutes of exercise, 2 to 5 times per week, for 6 to 12 weeks. Strength training, such as push-ups or leg lifts. Nutrition education. Group classes in which you can talk with others who also have COPD and learn ways to manage stress. If you use an oxygen tank, you should use it while you exercise. Work with your health care provider to adjust your oxygen for your physical activity. Your resting flow rate is different from your flow rate during physical activity. How to manage your breathing while exercising While you are exercising: Take slow breaths. Pace yourself, and do nottry to go too fast. Purse your lips while breathing out. Pursing your lips is similar to a kissing or whistling position. If doing exercise that uses a quick burst of effort, such as weight lifting: Breathe in before starting the exercise. Breathe out during the hardest part of the exercise, such as raising the weights. Where to find support You can find support for exercising with COPD from: Your health care provider. A pulmonary rehabilitation program. Your local health department or community health programs. Support groups, either online or in-person. Your health care provider may be able to recommend support groups. Where to find more information You can find more information about exercising with COPD from: American Lung Association: lung.org COPD Foundation: copdfoundation.org Contact a health care provider if: Your symptoms get worse. You have nausea. You have a fever. You want to start a new exercise program or a new activity. Get help right away if: You have chest pain.  You cannot breathe. These symptoms may represent a serious problem that is an emergency. Do not wait to see if the symptoms  will go away. Get medical help right away. Call your local emergency services (911 in the [place] Do not drive yourself to the hospital. Summary COPD is a general term that can be used to describe many different lung problems that cause lung inflammation and limit airflow. This includes chronic bronchitis and emphysema. Exercise and physical activity improve your shortness of breath by increasing blood flow (circulation). This causes your heart to provide more oxygen to your body. Contact your health care provider before starting any exercise program or new activity. Ask your health care provider what exercises and activities are safe for you. This information is not intended to replace advice given to you by your health care provider. Make sure you discuss any questions you have with your health care provider. Document Revised: 06/27/2020 Document Reviewed: 06/27/2020 Elsevier Patient Education  2023 [employer].

## 2022-03-19 ENCOUNTER — Encounter: Payer: Self-pay | Admitting: Nurse Practitioner

## 2022-03-19 ENCOUNTER — Ambulatory Visit (INDEPENDENT_AMBULATORY_CARE_PROVIDER_SITE_OTHER): Payer: Medicare Other | Admitting: Nurse Practitioner

## 2022-03-19 VITALS — BP 127/65 | HR 81 | Temp 98.6°F | Ht <= 58 in | Wt 100.6 lb

## 2022-03-19 DIAGNOSIS — I700 Atherosclerosis of aorta: Secondary | ICD-10-CM

## 2022-03-19 DIAGNOSIS — E440 Moderate protein-calorie malnutrition: Secondary | ICD-10-CM

## 2022-03-19 DIAGNOSIS — Z85118 Personal history of other malignant neoplasm of bronchus and lung: Secondary | ICD-10-CM

## 2022-03-19 DIAGNOSIS — D508 Other iron deficiency anemias: Secondary | ICD-10-CM

## 2022-03-19 DIAGNOSIS — E782 Mixed hyperlipidemia: Secondary | ICD-10-CM

## 2022-03-19 DIAGNOSIS — J432 Centrilobular emphysema: Secondary | ICD-10-CM | POA: Diagnosis not present

## 2022-03-19 DIAGNOSIS — F17219 Nicotine dependence, cigarettes, with unspecified nicotine-induced disorders: Secondary | ICD-10-CM

## 2022-03-19 DIAGNOSIS — E039 Hypothyroidism, unspecified: Secondary | ICD-10-CM

## 2022-03-19 DIAGNOSIS — E559 Vitamin D deficiency, unspecified: Secondary | ICD-10-CM

## 2022-03-19 NOTE — Assessment & Plan Note (Signed)
Chronic, ongoing with last FEV1 41% in January 2022 and FEV1/FVC 61%  (obstructive/restrictive) moderate stage.  She is tolerating Bevespi well and notices improved function with this, minimally using Ventolin.  Discussed with her if progression can consider transition to Trelegy in future.  Recommend complete cessation of smoking. Return in 6 months for follow-up.  CBC & CMP today.  Repeat spirometry at physical.  Referral to pulmonary to allow for local care post cancer.

## 2022-03-19 NOTE — Assessment & Plan Note (Signed)
[facility] oncology followed -- presented to upper left lobe in 2019 -- had partial lobectomy and has every 12 months scans, although missed recent June repeat scan.  Will order today locally and placed referral to local pulmonary for ongoing care, would benefit this as closer to home and easier access for her.

## 2022-03-19 NOTE — Progress Notes (Signed)
BP 127/65   Pulse 81   Temp 98.6 F (37 C) (Oral)   Ht 4' 6" (1.372 m)   Wt 100 lb 9.6 oz (45.6 kg)   SpO2 96%   BMI 24.26 kg/m    Subjective:    Patient ID: Lori Rangel, female    DOB: 04/16/1961, 61 y.o.   MRN: [id number]  HPI: Lori Rangel is a 61 y.o. female  Chief Complaint  Patient presents with   COPD   Lung Cancer    Patient daughter says she was seeing a provider in [place] and the family is requesting to have a new referral for her scans as patient was having scans here locally and having to drive all the way to [facility] to have them read.    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  Would prefer to be local for ongoing lung care and imaging.  She was to repeat imaging in June due to March imaging noting increased bulk to upper right lower lobe.  Continues Bevespi daily, has benefit from this.  Has history of thoracic aortic atherosclerosis noted on imaging.  Has been >3 years cancer free -- removed part of her upper lobe left side.   Current every day smoker -- smokes <1/2 PPD --  is cutting back on own.  Used to smoke 1 1/2 PPD to 2 PPD. Started smoking at age 51.    She takes Mirtazapine and Marinol for weight, as is underweight post cancer treatment, this was started by [facility].  Does not drink Ensure at home currently.   COPD status: stable Satisfied with current treatment?: yes Oxygen use: no Dyspnea frequency: none Cough frequency: rarely -- more if active Rescue inhaler frequency: not recently Limitation of activity: no Productive cough:  none Last Spirometry: January 2022 Pneumovax: up to date Influenza: yes  HYPERLIPIDEMIA Continues on Crestor 10 MG daily.   Hyperlipidemia status: good compliance Satisfied with current treatment?   yes Side effects:  no Medication compliance: good compliance Supplements: none Aspirin:  yes The 10-year ASCVD risk score (Arnett DK, et al., 2019) is: 7.5%   Values used to calculate the score:     Age: 49 years     Sex: Female     Is Non-Hispanic African American: No     Diabetic: No     Tobacco smoker: Yes     Systolic Blood Pressure: 505 mmHg     Is BP treated: No     HDL Cholesterol: 49 mg/dL     Total Cholesterol: 169 mg/dL Chest pain:  no Coronary artery disease:  no Family history CAD:  yes in aunt Family history early CAD:  no   HYPOTHYROIDISM Continues on Levothyroxine 25 MCG daily.   Thyroid control status:stable Satisfied with current treatment? yes Medication side effects: no Medication compliance: good compliance Etiology of hypothyroidism:  Recent dose adjustment:no Fatigue: no Cold intolerance: no Heat intolerance: no Weight gain: no Weight loss: no Constipation: no Diarrhea/loose stools: no Palpitations: no Lower extremity edema: no Anxiety/depressed mood: no   ANEMIA Continues on iron daily with Vitamin C tablet. Anemia status: stable Etiology of anemia: diet and post cancer treatment Duration of anemia treatment: chronic Compliance with treatment: good compliance Iron supplementation side effects:  no Severity of anemia: mild Fatigue: no Decreased exercise tolerance: no  Dyspnea on exertion: no Palpitations: no Bleeding: no Pica: no   Relevant past medical, surgical, family and social history reviewed and updated as indicated. Interim medical history since our last visit reviewed. Allergies and medications reviewed and updated.  Review of Systems  Constitutional:  Negative for activity change, appetite change, diaphoresis, fatigue and fever.  Respiratory:  Negative for cough, chest tightness, shortness of breath and wheezing.   Cardiovascular:  Negative for chest pain, palpitations and leg swelling.  Gastrointestinal: Negative.    Endocrine: Negative for cold intolerance and heat intolerance.  Genitourinary: Negative.   Neurological: Negative.   Psychiatric/Behavioral: Negative.     Per HPI unless specifically indicated above    Objective:    BP 127/65   Pulse 81   Temp 98.6 F (37 C) (Oral)   Ht 4' 6" (1.372 m)   Wt 100 lb 9.6 oz (45.6 kg)   SpO2 96%   BMI 24.26 kg/m   Wt Readings from Last 3 Encounters:  03/19/22 100 lb 9.6 oz (45.6 kg)  09/19/21 101 lb 9.6 oz (46.1 kg)  03/09/21 102 lb 12.8 oz (46.6 kg)    Physical Exam Vitals and nursing note reviewed.  Constitutional:      General: She is awake. She is not in acute distress.    Appearance: She is well-developed, well-groomed and underweight. She is not ill-appearing.     Comments: Frail appearing, using walker for mobility.  HENT:     Head: Normocephalic.     Right Ear: Hearing normal.     Left Ear: Hearing normal.  Eyes:     General: Lids are normal.        Right eye: No discharge.        Left eye: No discharge.     Conjunctiva/sclera: Conjunctivae normal.     Pupils: Pupils are equal, round, and reactive to light.  Neck:     Thyroid: No thyromegaly.     Vascular: No carotid bruit.  Cardiovascular:     Rate and Rhythm: Normal rate and regular rhythm.     Heart sounds: Normal heart sounds. No murmur heard.    No gallop.  Pulmonary:     Effort: Pulmonary effort is normal. No accessory muscle usage or respiratory distress.     Breath sounds: Decreased breath sounds present.     Comments: Diminished throughout and clear, no wheezes. Abdominal:     General: Bowel sounds are normal.     Palpations: Abdomen is soft.  Musculoskeletal:     Cervical back: Normal range of motion and neck supple.     Right lower leg: No edema.     Left lower leg: No edema.  Lymphadenopathy:     Cervical: No cervical adenopathy.  Skin:    General: Skin is warm and dry.  Neurological:     Mental Status: She is alert and oriented to person, place, and  time.  Psychiatric:        Attention and Perception: Attention normal.        Mood and Affect: Mood normal.        Speech: Speech normal.        Behavior: Behavior normal. Behavior is cooperative.        Thought Content: Thought content normal.    Results for orders placed or performed in visit on 09/19/21  [id number] 11+Oxyco+Alc+Crt-Bund  Result Value Ref Range   Ethanol Negative Cutoff=0.020 %  Amphetamines, Urine Negative Cutoff=1000 ng/mL   Barbiturate Negative Cutoff=200 ng/mL   BENZODIAZ UR QL Negative Cutoff=200 ng/mL   Cannabinoid Quant, Ur See Final Results Cutoff=50 ng/mL   Cocaine (Metabolite) Negative Cutoff=300 ng/mL   OPIATE SCREEN URINE Negative Cutoff=300 ng/mL   Oxycodone/Oxymorphone, Urine Negative Cutoff=300 ng/mL   Phencyclidine Negative Cutoff=25 ng/mL   Methadone Screen, Urine Negative Cutoff=300 ng/mL   Propoxyphene Negative Cutoff=300 ng/mL   Meperidine Negative Cutoff=200 ng/mL   Tramadol Negative Cutoff=200 ng/mL   Creatinine 16.7 (L) 20.0 - 300.0 mg/dL   pH, Urine 6.5 4.5 - 8.9  Comprehensive metabolic panel  Result Value Ref Range   Glucose 89 70 - 99 mg/dL   BUN 7 (L) 8 - 27 mg/dL   Creatinine, Ser 0.62 0.57 - 1.00 mg/dL   eGFR 101 >59 mL/min/1.73   BUN/Creatinine Ratio 11 (L) 12 - 28   Sodium 137 134 - 144 mmol/L   Potassium 3.9 3.5 - 5.2 mmol/L   Chloride 97 96 - 106 mmol/L   CO2 26 20 - 29 mmol/L   Calcium 9.8 8.7 - 10.3 mg/dL   Total Protein 6.9 6.0 - 8.5 g/dL   Albumin 4.8 3.8 - 4.8 g/dL   Globulin, Total 2.1 1.5 - 4.5 g/dL   Albumin/Globulin Ratio 2.3 (H) 1.2 - 2.2   Bilirubin Total <0.2 0.0 - 1.2 mg/dL   Alkaline Phosphatase 112 44 - 121 IU/L   AST 20 0 - 40 IU/L   ALT 11 0 - 32 IU/L  Lipid Panel w/o Chol/HDL Ratio  Result Value Ref Range   Cholesterol, Total 169 100 - 199 mg/dL   Triglycerides 153 (H) 0 - 149 mg/dL   HDL 49 >39 mg/dL   VLDL Cholesterol Cal 27 5 - 40 mg/dL   LDL Chol Calc (NIH) 93 0 - 99 mg/dL  Magnesium  Result  Value Ref Range   Magnesium 1.8 1.6 - 2.3 mg/dL  CBC with Differential/Platelet  Result Value Ref Range   WBC 8.0 3.4 - 10.8 x10E3/uL   RBC 4.26 3.77 - 5.28 x10E6/uL   Hemoglobin 14.4 11.1 - 15.9 g/dL   Hematocrit 40.3 34.0 - 46.6 %   MCV 95 79 - 97 fL   MCH 33.8 (H) 26.6 - 33.0 pg   MCHC 35.7 31.5 - 35.7 g/dL   RDW 11.5 (L) 11.7 - 15.4 %   Platelets 297 150 - 450 x10E3/uL   Neutrophils 62 Not Estab. %   Lymphs 31 Not Estab. %   Monocytes 5 Not Estab. %   Eos 1 Not Estab. %   Basos 1 Not Estab. %   Neutrophils Absolute 5.0 1.4 - 7.0 x10E3/uL   Lymphocytes Absolute 2.5 0.7 - 3.1 x10E3/uL   Monocytes Absolute 0.4 0.1 - 0.9 x10E3/uL   EOS (ABSOLUTE) 0.1 0.0 - 0.4 x10E3/uL   Basophils Absolute 0.1 0.0 - 0.2 x10E3/uL   Immature Granulocytes 0 Not Estab. %   Immature Grans (Abs) 0.0 0.0 - 0.1 x10E3/uL  Urinalysis, Routine w reflex microscopic  Result Value Ref Range   Specific Gravity, UA 1.010 1.005 - 1.030   pH, UA 6.0 5.0 - 7.5   Color, UA Yellow Yellow   Appearance Ur Clear Clear   Leukocytes,UA Negative Negative   Protein,UA Negative Negative/Trace   Glucose, UA Negative Negative   Ketones, UA Trace (A) Negative   RBC, UA Negative Negative   Bilirubin, UA Negative Negative   Urobilinogen, Ur 0.2 0.2 - 1.0 mg/dL   Nitrite, UA  Negative Negative  Cologuard  Result Value Ref Range   COLOGUARD Negative Negative  Cannabinoid Conf, Ur  Result Value Ref Range   CANNABINOIDS Positive (A) Cutoff=50   Carboxy THC GC/MS Conf 15 Cutoff=15 ng/mL  Specific Gravity  Result Value Ref Range   Specific Gravity 1.0030       Assessment & Plan:   Problem List Items Addressed This Visit       Cardiovascular and Mediastinum   Thoracic aortic atherosclerosis (HCC)    Chronic.  Noted on past imaging in 2017.  Recommend complete cessation of smoking.  Continue statin at low dose daily and Baby ASA for preventative care.      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  She is tolerating Bevespi well and notices improved function with this, minimally using Ventolin.  Discussed with her if progression can consider transition to Trelegy in future.  Recommend complete cessation of smoking. Return in 6 months for follow-up.  CBC & CMP today.  Repeat spirometry at physical.  Referral to pulmonary to allow for local care post cancer.      Relevant Orders   CBC with Differential/Platelet   Ambulatory referral to Pulmonology   CT Chest Wo Contrast     Endocrine   Hypothyroidism    Chronic, ongoing.  Will continue current regimen at this time and adjust as needed based on labs.  TSH and Free T4 today.      Relevant Orders   TSH   T4, free     Nervous and Auditory   Nicotine dependence, cigarettes, w unsp disorders    I have recommended complete cessation of tobacco use. I have discussed various options available for assistance with tobacco cessation including over the counter methods (Nicotine gum, patch and lozenges). We also discussed prescription options (Chantix, Nicotine Inhaler / Nasal Spray). The patient is not interested in pursuing any prescription tobacco cessation options at this time.         Other   History of lung cancer    [facility] oncology followed -- presented to upper left lobe in 2019 -- had partial lobectomy and has every 12 months scans, although missed recent June repeat scan.  Will order today locally and placed referral to local pulmonary for ongoing care, would benefit this as closer to home and easier access for her.        Relevant Orders   Ambulatory referral to Pulmonology   CT Chest Wo Contrast   Iron deficiency anemia secondary to inadequate dietary iron intake    Ongoing, stable, continues on Vitron with benefit.  Will continue this  regimen and obtain labs today to include CBC + iron check.      Relevant Orders   Iron Binding Cap (TIBC)([facility])   Ferritin   Mixed hyperlipidemia    Chronic, ongoing.  Continue current medication regimen and adjust as needed.  Lipid panel today.      Relevant Orders   Comprehensive metabolic panel   Lipid Panel w/o Chol/HDL Ratio   Moderate protein-calorie malnutrition ([place])    Ongoing issue, post lung CA.  Will continue Mirtazapine and Dronabinol, as currently prescribed.  This is offering benefit to her appetite and maintaining weight.  She is aware Dronabinol is a controlled substance  and will require yearly drug screen and controlled substance contract, which she is agreeable to. UDS up to date, due next January 2023, and obtain contract.      Other Visit Diagnoses     Vitamin D deficiency       History of low levels, check on labs today and intiate supplement as needed.   Relevant Orders   VITAMIN D 25 Hydroxy (Vit-D Deficiency, Fractures)        Follow up plan: Return in about 6 months (around 09/19/2022) for Annual physical Exam + needs controlled substance agreement signed (please have on hand).

## 2022-03-19 NOTE — Assessment & Plan Note (Signed)
Chronic.  Noted on past imaging in 2017.  Recommend complete cessation of smoking.  Continue statin at low dose daily and Baby ASA for preventative care.

## 2022-03-19 NOTE — Assessment & Plan Note (Signed)
Ongoing, stable, continues on Vitron with benefit.  Will continue this regimen and obtain labs today to include CBC + iron check.

## 2022-03-19 NOTE — Assessment & Plan Note (Signed)
Chronic, ongoing.  Will continue current regimen at this time and adjust as needed based on labs.  TSH and Free T4 today.

## 2022-03-19 NOTE — Assessment & Plan Note (Signed)
I have recommended complete cessation of tobacco use. I have discussed various options available for assistance with tobacco cessation including over the counter methods (Nicotine gum, patch and lozenges). We also discussed prescription options (Chantix, Nicotine Inhaler / Nasal Spray). The patient is not interested in pursuing any prescription tobacco cessation options at this time.  

## 2022-03-19 NOTE — Assessment & Plan Note (Signed)
Ongoing issue, post lung CA.  Will continue Mirtazapine and Dronabinol, as currently prescribed.  This is offering benefit to her appetite and maintaining weight.  She is aware Dronabinol is a controlled substance and will require yearly drug screen and controlled substance contract, which she is agreeable to. UDS up to date, due next January 2023, and obtain contract.

## 2022-03-19 NOTE — Assessment & Plan Note (Signed)
Chronic, ongoing.  Continue current medication regimen and adjust as needed. Lipid panel today. 

## 2022-03-20 LAB — COMPREHENSIVE METABOLIC PANEL
ALT: 13 IU/L (ref 0–32)
AST: 25 IU/L (ref 0–40)
Albumin/Globulin Ratio: 2 (ref 1.2–2.2)
Albumin: 4.5 g/dL (ref 3.9–4.9)
Alkaline Phosphatase: 127 IU/L — ABNORMAL HIGH (ref 44–121)
BUN/Creatinine Ratio: 8 — ABNORMAL LOW (ref 12–28)
BUN: 5 mg/dL — ABNORMAL LOW (ref 8–27)
Bilirubin Total: <0.2 mg/dL (ref 0.0–1.2)
CO2: 26 mmol/L (ref 20–29)
Calcium: 9.4 mg/dL (ref 8.7–10.3)
Chloride: 100 mmol/L (ref 96–106)
Creatinine, Ser: 0.62 mg/dL (ref 0.57–1.00)
Globulin, Total: 2.2 g/dL (ref 1.5–4.5)
Glucose: 88 mg/dL (ref 70–99)
Potassium: 4 mmol/L (ref 3.5–5.2)
Sodium: 139 mmol/L (ref 134–144)
Total Protein: 6.7 g/dL (ref 6.0–8.5)
eGFR: 101 mL/min/{1.73_m2} (ref >59)

## 2022-03-20 LAB — CBC WITH DIFFERENTIAL/PLATELET
Basophils Absolute: 0.1 10*3/uL (ref 0.0–0.2)
Basos: 1 %
EOS (ABSOLUTE): 0.1 10*3/uL (ref 0.0–0.4)
Eos: 1 %
Hematocrit: 41.3 % (ref 34.0–46.6)
Hemoglobin: 13.9 g/dL (ref 11.1–15.9)
Immature Grans (Abs): 0 10*3/uL (ref 0.0–0.1)
Immature Granulocytes: 0 %
Lymphocytes Absolute: 2.6 10*3/uL (ref 0.7–3.1)
Lymphs: 32 %
MCH: 31.9 pg (ref 26.6–33.0)
MCHC: 33.7 g/dL (ref 31.5–35.7)
MCV: 95 fL (ref 79–97)
Monocytes Absolute: 0.4 10*3/uL (ref 0.1–0.9)
Monocytes: 5 %
Neutrophils Absolute: 4.9 10*3/uL (ref 1.4–7.0)
Neutrophils: 61 %
Platelets: 297 10*3/uL (ref 150–450)
RBC: 4.36 x10E6/uL (ref 3.77–5.28)
RDW: 11.3 % — ABNORMAL LOW (ref 11.7–15.4)
WBC: 8.1 10*3/uL (ref 3.4–10.8)

## 2022-03-20 LAB — LIPID PANEL W/O CHOL/HDL RATIO
Cholesterol, Total: 159 mg/dL (ref 100–199)
HDL: 45 mg/dL (ref >39)
LDL Chol Calc (NIH): 79 mg/dL (ref 0–99)
Triglycerides: 212 mg/dL — ABNORMAL HIGH (ref 0–149)
VLDL Cholesterol Cal: 35 mg/dL (ref 5–40)

## 2022-03-20 LAB — IRON AND TIBC
Iron Saturation: 27 % (ref 15–55)
Iron: 69 ug/dL (ref 27–139)
Total Iron Binding Capacity: 258 ug/dL (ref 250–450)
UIBC: 189 ug/dL (ref 118–369)

## 2022-03-20 LAB — T4, FREE: Free T4: 1.02 ng/dL (ref 0.82–1.77)

## 2022-03-20 LAB — TSH: TSH: 0.708 u[IU]/mL (ref 0.450–4.500)

## 2022-03-20 LAB — VITAMIN D 25 HYDROXY (VIT D DEFICIENCY, FRACTURES): Vit D, 25-Hydroxy: 46.5 ng/mL (ref 30.0–100.0)

## 2022-03-20 LAB — FERRITIN: Ferritin: 692 ng/mL — ABNORMAL HIGH (ref 15–150)

## 2022-03-20 NOTE — Progress Notes (Signed)
Good morning, please let Sheneka know her labs have returned: - CBC is showing stable levels with no infection or anemia. - Kidney function, creatinine and eGFR, remains normal, as is liver function, AST and ALT.  Alkaline phosphatase is mildly elevated, we will continue to monitor this at visits as can be related to bone health or gall bladder. - Cholesterol labs remain stable with exception of triglycerides which remain slightly elevated, continue Rosuvastatin daily. - Thyroid labs stable, continue current dosing of Levothyroxine.  - Iron level stable, I recommend stopping iron supplement for now as ferritin level is quite elevated in 600 range.  We will recheck next visit, if ferritin remains high I may get you into hematology for further assessment. - Vitamin D level stable -- continue supplement.  Any questions? Keep being amazing!!  Thank you for allowing me to participate in your care.  I appreciate you. Kindest regards, Jolene 

## 2022-03-28 ENCOUNTER — Ambulatory Visit
Admission: RE | Admit: 2022-03-28 | Discharge: 2022-03-28 | Disposition: A | Discharge status: Home / Self Care | Payer: Medicare Other | Source: Ambulatory Visit | Attending: Nurse Practitioner | Admitting: Nurse Practitioner

## 2022-03-28 DIAGNOSIS — J432 Centrilobular emphysema: Secondary | ICD-10-CM | POA: Diagnosis present

## 2022-03-28 DIAGNOSIS — Z85118 Personal history of other malignant neoplasm of bronchus and lung: Secondary | ICD-10-CM | POA: Insufficient documentation

## 2022-04-02 ENCOUNTER — Telehealth: Payer: Self-pay | Admitting: *Deleted

## 2022-04-02 ENCOUNTER — Telehealth: Payer: Self-pay | Admitting: Nurse Practitioner

## 2022-04-02 ENCOUNTER — Telehealth: Payer: Self-pay

## 2022-04-02 DIAGNOSIS — Z85118 Personal history of other malignant neoplasm of bronchus and lung: Secondary | ICD-10-CM

## 2022-04-02 DIAGNOSIS — R911 Solitary pulmonary nodule: Secondary | ICD-10-CM

## 2022-04-02 NOTE — Telephone Encounter (Signed)
Per Lenna Sciara via epic secure chat- patient's daughter has been made aware appt. Nothing further needed.

## 2022-04-02 NOTE — Telephone Encounter (Signed)
Pending appt with Dr. Patsey Berthold 04/30/2022. PCP is requesting sooner appt. Pending PET.  Recent CT.  Dr. Patsey Berthold, please advise. Thanks

## 2022-04-02 NOTE — Telephone Encounter (Signed)
Spoke to [place] on phone and answered all questions, reviewed March 2023 scan and current scan (one at [facility] and one local with [facility]).  They see pulmonary tomorrow and will further review with them the next steps that need to be taken.

## 2022-04-02 NOTE — Telephone Encounter (Signed)
Per Dr. Patsey Berthold verbally- offer OV 8/2/2023at 11:30.

## 2022-04-02 NOTE — Telephone Encounter (Signed)
[facility] - Imaging- CT scan  IMPRESSION: 12 x 9 mm nodular density is noted in right lower lobe which appears to have increased in size compared to prior exam, and is highly concerning for primary malignancy. PET scan is recommended for further evaluation. These results will be called to the ordering clinician or representative by the Radiologist Assistant, and communication documented in the PACS or zVision Dashboard.   Stable postoperative changes are seen involving the left lung with associated scarring, consistent with history of left lobectomy.   Cholelithiasis.  Calling to alert provider results in [facility]

## 2022-04-02 NOTE — Telephone Encounter (Signed)
Spoke to patient's daughter, DPR, Willeen Niece on telephone to go over recent lung CT results.  Lori Rangel is patient's main support and contact.  Notified her that CT is showing area of concern for possible malignancy to right lower lobe with recommendations for PET scan.  Patient is scheduled to establish to pulmonary locally on 04/30/22, will see if we can move this up and will order PET scan.  Patient with history of lung cancer to left lobe and surgical removal.  Lori Rangel verbalized understanding, would like contact to be her for scheduling purposes and she will discuss with her mother the results.  Offered support and provided active listening during this time.

## 2022-04-02 NOTE — Telephone Encounter (Signed)
Pts daughter called to speak back with Lori Rangel / she has a coupe question she forgot to ask about her moms results/ please advise

## 2022-04-03 ENCOUNTER — Ambulatory Visit (INDEPENDENT_AMBULATORY_CARE_PROVIDER_SITE_OTHER): Payer: Medicare Other | Admitting: Pulmonary Disease

## 2022-04-03 ENCOUNTER — Encounter: Payer: Self-pay | Admitting: Pulmonary Disease

## 2022-04-03 VITALS — BP 134/80 | HR 74 | Temp 97.7°F | Ht <= 58 in | Wt 96.2 lb

## 2022-04-03 DIAGNOSIS — R0602 Shortness of breath: Secondary | ICD-10-CM | POA: Diagnosis not present

## 2022-04-03 DIAGNOSIS — R0989 Other specified symptoms and signs involving the circulatory and respiratory systems: Secondary | ICD-10-CM

## 2022-04-03 DIAGNOSIS — F1011 Alcohol abuse, in remission: Secondary | ICD-10-CM

## 2022-04-03 DIAGNOSIS — F17210 Nicotine dependence, cigarettes, uncomplicated: Secondary | ICD-10-CM

## 2022-04-03 DIAGNOSIS — R911 Solitary pulmonary nodule: Secondary | ICD-10-CM | POA: Diagnosis not present

## 2022-04-03 DIAGNOSIS — J449 Chronic obstructive pulmonary disease, unspecified: Secondary | ICD-10-CM | POA: Diagnosis not present

## 2022-04-03 DIAGNOSIS — Z85118 Personal history of other malignant neoplasm of bronchus and lung: Secondary | ICD-10-CM | POA: Diagnosis not present

## 2022-04-03 MED ORDER — TRELEGY ELLIPTA 100-62.5-25 MCG/ACT IN AEPB: 1.0000 | INHALATION_SPRAY | Freq: Every day | RESPIRATORY_TRACT | 0 refills | Status: DC

## 2022-04-03 NOTE — Patient Instructions (Signed)
We are giving you a trial of Trelegy Ellipta 100, this is an inhaler similar to Anoro but with the third medication in it.  It is 1 puff daily.  Make sure you rinse your mouth well after you use it.  DO NOT USE YOUR BEVESPI INHALER WHILE ON THE TRELEGY.  Let us know how you do with the Trelegy so we can send the prescription into your pharmacy.  We are going to get breathing test to give [place] an idea about your lung function.  I am sending a referral to Dr. Baruch Gouty at the cancer center for evaluation of localized radiation to the small nodule you have.  This is of course pending the PET/CT results.  We will see you in follow-up here in 6 to 8 weeks time call sooner should any new problems arise.

## 2022-04-03 NOTE — Progress Notes (Signed)
Subjective:    Patient ID: Lori Rangel, female    DOB: Jun 07, 1961, 61 y.o.   MRN: [id number] Patient Care Team: Venita Lick, NP as PCP - General (Nurse Practitioner) Minna Merritts, MD as Consulting Physician (Cardiology)  Chief Complaint  Patient presents with   pulmonary consult    CT--occ prod cough with white to yellow sputum.    HPI Patient is a 61 year old current smoker (half PPD) with a history as noted below, who presents for evaluation of a right lung nodule.  Patient is kindly referred by Marnee Guarneri, NP.  [name] chart was reviewed in detail as she is somewhat imprecise with dates.  Review shows that in 2017 the patient developed a left upper lobe nodule, she underwent electromagnetic navigational bronchoscopy at [facility] and this was nondiagnostic.  Subsequently she was referred to thoracic surgery at [facility] where she underwent evaluation with percutaneous needle biopsy which resulted in pneumothorax.  She subsequently underwent referral to radiation oncology as she was not an operative candidate.  Per the family's information, the patient did have recurrence after initial radiation and she required a second round of radiation to the left lung.  That left lung has significant volume loss due to postradiation changes.  The patient had been followed up at [facility] last of which was performed on 2 March.  She was to have repeat CT however the patient and the family requested that care be transferred back to the [place] area.  [name] primary care practitioner Marnee Guarneri, NP ordered a CT scan of the chest which has shown a nodule on the right lung that is 12 mm in diameter.  This looks suspicious for carcinoma.  The patient has not had any hemoptysis.  She has chronic morning cough productive of whitish sputum.  No recent weight loss or anorexia.  Patient is aware that she is not an operative candidate and also aware that invasive procedures are high risk in her  situation.  She has not had a PET/CT yet.  [name] only other symptoms are those of dyspnea.  She had been doing very well with Anoro however her insurance does not cover this medication and she had to switch to [employer].  She does not feel that this is as helpful and sometimes forgets the second dose of the day.  She does not endorse any other symptomatology.  No chest pain.  No lower extremity edema and no calf tenderness.  Patient reports that she continues to smoke a half a pack of cigarettes per day.  She had difficulties with alcohol abuse in the past however she has discontinued drinking and has been abstinent for years.   Review of Systems A 10 point review of systems was performed and it is as noted above otherwise negative.  Past Medical History:  Diagnosis Date   Asthma    Cancer ([place])    lung   COPD (chronic obstructive pulmonary disease) ([place])    Severe by PFTs 2017   Shortness of breath dyspnea    Squamous cell carcinoma lung, left ([place]) 2017   Radiation therapy, patient was not surgical candidate   Thyroid disease    Weight decrease major recent weight loss   Past Surgical History:  Procedure Laterality Date   BREAST BIOPSY Right    COLONOSCOPY     ELECTROMAGNETIC NAVIGATION BROCHOSCOPY N/A 01/02/2016   Procedure: ELECTROMAGNETIC NAVIGATION BRONCHOSCOPY;  Surgeon: Flora Lipps, MD;  Location: [facility] ORS;  Service: Cardiopulmonary;  Laterality:  N/A;   ESOPHAGOGASTRODUODENOSCOPY (EGD) WITH PROPOFOL N/A 08/27/2018   Procedure: ESOPHAGOGASTRODUODENOSCOPY (EGD) WITH PROPOFOL;  Surgeon: Lucilla Lame, MD;  Location: [facility] ENDOSCOPY;  Service: Endoscopy;  Laterality: N/A;   INTRAMEDULLARY (IM) NAIL INTERTROCHANTERIC Right 11/03/2017   Procedure: INTRAMEDULLARY (IM) NAIL INTERTROCHANTRIC;  Surgeon: Leim Fabry, MD;  Location: [facility] ORS;  Service: Orthopedics;  Laterality: Right;   Patient Active Problem List   Diagnosis Date Noted   GERD without esophagitis 09/19/2021   Mixed  hyperlipidemia 03/09/2021   Iron deficiency anemia secondary to inadequate dietary iron intake 07/12/2020   Hypothyroidism 07/12/2020   Medication monitoring encounter 07/12/2020   History of lung cancer 07/07/2020   History of alcohol abuse 10/09/2018   Uses walker 06/03/2018   History of fracture of right hip 11/03/2017   Thoracic aortic atherosclerosis ([place]) 06/07/2016   COPD with emphysema ([place]) 01/25/2016   Carotid stenosis 12/31/2015   Nicotine dependence, cigarettes, w unsp disorders 12/31/2015   Right lower lobe pulmonary nodule 12/31/2015   Moderate protein-calorie malnutrition ([place]) 12/17/2015   Family History  Problem Relation Age of Onset   Cirrhosis Father    Lung cancer Sister    Social History   Tobacco Use   Smoking status: Every Day    Packs/day: 2.00    Years: 50.00    Total pack years: 100.00    Types: Cigarettes   Smokeless tobacco: Never   Tobacco comments:    0.5PPD 04/03/2022  Substance Use Topics   Alcohol use: Not Currently    Alcohol/week: 0.0 standard drinks of alcohol   Allergies  Allergen Reactions   Levaquin [Levofloxacin] Nausea Only    Cramps & Body aches   Current Meds  Medication Sig   acetaminophen (TYLENOL) 500 MG tablet Take 500-1,000 mg by mouth every 8 (eight) hours as needed for mild constipation or moderate pain.   aspirin 81 MG EC tablet Take 1 tablet by mouth daily.   cholecalciferol (VITAMIN D3) 25 MCG (1000 UNIT) tablet Take 1,000 Units by mouth daily.   dronabinol (MARINOL) 2.5 MG capsule TAKE ONE CAPSULE TWICE A DAY BEFORE MEALS   Glycopyrrolate-Formoterol (BEVESPI AEROSPHERE) 9-4.8 MCG/ACT AERO Inhale 2 puffs into the lungs daily.   levothyroxine (SYNTHROID) 25 MCG tablet TAKE 1 TABLET EVERY DAY ON EMPTY STOMACHWITH A GLASS OF WATER AT LEAST 30-60 MINBEFORE BREAKFAST   mirtazapine (REMERON) 15 MG tablet Take 1 tablet (15 mg total) by mouth at bedtime.   Multiple Vitamin (MULTIVITAMIN WITH MINERALS) TABS tablet Take 1  tablet by mouth daily.   rosuvastatin (CRESTOR) 10 MG tablet TAKE 1 TABLET BY MOUTH DAILY.   VENTOLIN HFA 108 (90 Base) MCG/ACT inhaler INHALE TWO PUFFS EVERY SIX HOURS AS NEEDED FOR WHEEZING OR SHORTNESS OF BREATH   Immunization History  Administered Date(s) Administered   Influenza Inj Mdck Quad Pf 06/03/2018   Influenza Split 07/04/2015   Influenza,inj,Quad PF,6+ Mos 06/03/2018, 06/15/2019   Influenza-Unspecified 06/06/2016, 06/21/2020, 07/18/2021   Moderna Sars-Covid-2 Vaccination 05/27/2020, 06/24/2020   Pfizer Covid-19 Vaccine Bivalent Booster 1yrs & up 12/23/2020   Pneumococcal Polysaccharide-23 09/08/2020       Objective:   Physical Exam BP 134/80 (BP Location: Left Arm, Cuff Size: Normal)   Pulse 74   Temp 97.7 F (36.5 C) (Temporal)   Ht 4\' 10"  (1.473 m)   Wt 96 lb 3.2 oz (43.6 kg)   SpO2 97%   BMI 20.11 kg/m  GENERAL: Debilitated, frail-appearing woman, looks markedly older than stated age, no acute distress, ambulates with walker.  HEAD: Normocephalic, atraumatic.  EYES: Pupils equal, round, reactive to light.  No scleral icterus.  MOUTH: Poor dentition missing teeth lower, teeth in poor repair upper, pharynx clear. NECK: Supple. No thyromegaly. Trachea midline. No JVD.  No adenopathy. PULMONARY: Distant breath sounds bilaterally.  Faint end expiratory wheezes throughout, no rhonchi. CARDIOVASCULAR: S1 and S2. Regular rate and rhythm.  ABDOMEN: Benign. MUSCULOSKELETAL: No joint deformity, no clubbing, no edema.  NEUROLOGIC: Unsteady gait, requires walker.  No overt localizing finding. SKIN: Intact,warm,dry. PSYCH: Mood and behavior normal.  SPN malignancy risk: 11.44% Fransisco Beau, [facility]) not adjusted for PET/CT.  Representative image of CT scan of the chest performed 28 March 2022 showing the nodule of concern:      Assessment & Plan:     ICD-10-CM   1. Lung nodule  R91.1    Patient is a very poor candidate for invasive procedures Low pulmonary reserve Will  obtain PET/CT Recommend empiric SBRT if PET/CT positive    2. COPD suggested by initial evaluation ([place])  J44.9    PFTs to clarify Trial of Trelegy Ellipta 100, 1 inhalation daily    3. SOB (shortness of breath)  R06.02 Pulmonary Function Test [facility] Only   Will obtain PFTs to evaluate Suspect due to poorly compensated COPD    4. History of lung cancer  Z85.118 Ambulatory referral to Radiation Oncology   This issue adds complexity to her management Prior resection on the left Prior XRT to left lung due to carcinoma recurrence    5. History of alcohol abuse  F10.11    Patient is abstinent of alcohol    6. Tobacco dependence due to cigarettes  F17.210    Patient counseled regards to discontinuation of smoking Total counseling time 3 to 5 minutes     Orders Placed This Encounter  Procedures   Ambulatory referral to Radiation Oncology    Referral Priority:   Routine    Referral Type:   Consultation    Referral Reason:   Specialty Services Required    Requested Specialty:   Radiation Oncology    Number of Visits Requested:   1   Pulmonary Function Test [facility] Only    URGENT    Standing Status:   Future    Standing Expiration Date:   04/04/2023    Order Specific Question:   Full PFT: includes the following: basic spirometry, spirometry pre & post bronchodilator, diffusion capacity (DLCO), lung volumes    Answer:   Full PFT   Meds ordered this encounter  Medications   Fluticasone-Umeclidin-Vilant (TRELEGY ELLIPTA) 100-62.5-25 MCG/ACT AEPB    Sig: Inhale 1 puff into the lungs daily.    Dispense:  14 each    Refill:  0    Order Specific Question:   Lot Number?    Answer:   Jiles Crocker    Order Specific Question:   Expiration Date?    Answer:   09/03/2023    Order Specific Question:   Manufacturer?    Answer:   [employer] [12]    Order Specific Question:   Quantity    Answer:   2   Discussed that the patient is a very poor candidate for invasive procedures given the severe lung  volume loss on the left lung, very poor performance status and baseline severe COPD.  Will obtain PET/CT and if the nodule has significant FDG avidity the patient may be a candidate for empiric SBRT.  We will place a consult to radiation oncology for consideration of empiric SBRT.  She had been doing well on Anoro however this medication was not covered by her insurance.  She does not feel as well compensated on Bevespi.  She did have some issues with bronchospasm noted today.  We will switch her to Trelegy which is basically Anoro with ICS.  She is to let us know how she does with this medication.  We will see the patient in follow-up in 6 to 8 weeks time she is to contact us prior to that time should any new difficulties arise.  Total visit time 62 minutes.  Renold Don, MD Advanced Bronchoscopy PCCM [facility]    *This note was dictated using voice recognition software/Dragon.  Despite best efforts to proofread, errors can occur which can change the meaning. Any transcriptional errors that result from this process are unintentional and may not be fully corrected at the time of dictation.

## 2022-04-05 ENCOUNTER — Encounter: Payer: Self-pay | Admitting: Pulmonary Disease

## 2022-04-11 ENCOUNTER — Ambulatory Visit: Payer: Medicare Other | Attending: Pulmonary Disease

## 2022-04-11 DIAGNOSIS — R0602 Shortness of breath: Secondary | ICD-10-CM

## 2022-04-11 DIAGNOSIS — J449 Chronic obstructive pulmonary disease, unspecified: Secondary | ICD-10-CM | POA: Diagnosis not present

## 2022-04-11 LAB — PULMONARY FUNCTION TEST ARMC ONLY
DL/VA % pred: 47 %
DL/VA: 2.01 ml/min/mmHg/L
DLCO unc % pred: 34 %
DLCO unc: 6.51 ml/min/mmHg
FEF 25-75 Post: 0.38 L/sec
FEF 25-75 Pre: 0.36 L/sec
FEF2575-%Change-Post: 5 %
FEF2575-%Pred-Post: 17 %
FEF2575-%Pred-Pre: 16 %
FEV1-%Change-Post: 2 %
FEV1-%Pred-Post: 36 %
FEV1-%Pred-Pre: 35 %
FEV1-Post: 0.84 L
FEV1-Pre: 0.82 L
FEV1FVC-%Change-Post: 2 %
FEV1FVC-%Pred-Pre: 52 %
FEV6-%Change-Post: 1 %
FEV6-%Pred-Post: 67 %
FEV6-%Pred-Pre: 65 %
FEV6-Post: 1.94 L
FEV6-Pre: 1.9 L
FEV6FVC-%Change-Post: 3 %
FEV6FVC-%Pred-Post: 101 %
FEV6FVC-%Pred-Pre: 98 %
FVC-%Change-Post: 0 %
FVC-%Pred-Post: 66 %
FVC-%Pred-Pre: 66 %
FVC-Post: 2.01 L
FVC-Pre: 2.01 L
Post FEV1/FVC ratio: 42 %
Post FEV6/FVC ratio: 98 %
Pre FEV1/FVC ratio: 41 %
Pre FEV6/FVC Ratio: 95 %
RV % pred: 124 %
RV: 2.41 L
TLC % pred: 96 %
TLC: 4.6 L

## 2022-04-11 MED ORDER — ALBUTEROL SULFATE (2.5 MG/3ML) 0.083% IN NEBU
2.5000 mg | INHALATION_SOLUTION | Freq: Once | RESPIRATORY_TRACT | Status: AC
  Administered 2022-04-11: 2.5 mg via RESPIRATORY_TRACT
  Filled 2022-04-11: qty 3

## 2022-04-12 ENCOUNTER — Ambulatory Visit (HOSPITAL_COMMUNITY)
Admission: RE | Admit: 2022-04-12 | Discharge: 2022-04-12 | Disposition: A | Discharge status: Home / Self Care | Payer: Medicare Other | Source: Ambulatory Visit | Attending: Nurse Practitioner | Admitting: Nurse Practitioner

## 2022-04-12 DIAGNOSIS — R911 Solitary pulmonary nodule: Secondary | ICD-10-CM | POA: Insufficient documentation

## 2022-04-12 DIAGNOSIS — Z85118 Personal history of other malignant neoplasm of bronchus and lung: Secondary | ICD-10-CM | POA: Diagnosis not present

## 2022-04-12 LAB — GLUCOSE, CAPILLARY: Glucose-Capillary: 115 mg/dL — ABNORMAL HIGH (ref 70–99)

## 2022-04-12 MED ORDER — FLUDEOXYGLUCOSE F - 18 (FDG) INJECTION
5.0000 | Freq: Once | INTRAVENOUS | Status: AC | PRN
  Administered 2022-04-12: 5 via INTRAVENOUS

## 2022-04-16 ENCOUNTER — Ambulatory Visit
Admission: RE | Admit: 2022-04-16 | Discharge: 2022-04-16 | Disposition: A | Discharge status: Home / Self Care | Payer: Medicare Other | Source: Ambulatory Visit | Attending: Radiation Oncology | Admitting: Radiation Oncology

## 2022-04-16 ENCOUNTER — Encounter: Payer: Self-pay | Admitting: Radiation Oncology

## 2022-04-16 ENCOUNTER — Telehealth: Payer: Self-pay | Admitting: Pulmonary Disease

## 2022-04-16 VITALS — BP 127/59 | HR 82 | Temp 98.0°F | Resp 20 | Ht 59.0 in | Wt 97.2 lb

## 2022-04-16 DIAGNOSIS — J449 Chronic obstructive pulmonary disease, unspecified: Secondary | ICD-10-CM | POA: Insufficient documentation

## 2022-04-16 DIAGNOSIS — R918 Other nonspecific abnormal finding of lung field: Secondary | ICD-10-CM | POA: Insufficient documentation

## 2022-04-16 DIAGNOSIS — R0602 Shortness of breath: Secondary | ICD-10-CM | POA: Diagnosis not present

## 2022-04-16 DIAGNOSIS — F17210 Nicotine dependence, cigarettes, uncomplicated: Secondary | ICD-10-CM | POA: Insufficient documentation

## 2022-04-16 DIAGNOSIS — Z85118 Personal history of other malignant neoplasm of bronchus and lung: Secondary | ICD-10-CM | POA: Diagnosis not present

## 2022-04-16 DIAGNOSIS — R911 Solitary pulmonary nodule: Secondary | ICD-10-CM

## 2022-04-16 DIAGNOSIS — Z79899 Other long term (current) drug therapy: Secondary | ICD-10-CM | POA: Diagnosis not present

## 2022-04-16 NOTE — Consult Note (Signed)
NEW PATIENT EVALUATION  Name: Lori Rangel  MRN: [id number]  Date:   04/16/2022     DOB: 17-Nov-1960   This 61 y.o. female patient presents to the clinic for initial evaluation of presumed stage I non-small cell lung cancer of the right lower lobe and patient patient with previous treatment to her left upper lobe for invasive squamous cell carcinoma.  REFERRING PHYSICIAN: Venita Lick, NP  CHIEF COMPLAINT:  Chief Complaint  Patient presents with   Lung Cancer    consult    DIAGNOSIS: The encounter diagnosis was Right lower lobe pulmonary nodule.   PREVIOUS INVESTIGATIONS:  PET CT and CT scans reviewed Pathology reports reviewed Clinical notes reviewed  HPI: Patient is a 61 year old female followed by Dr. Patsey Berthold for a new lesion found in her right lower lobe.  This was a spiculated lesion concerning for malignancy and PET CT scan confirmed hypermetabolic activity consistent with a stage I non-small cell lung cancer.  She is status post CyberKnife back in 2017 at [facility] for T1N0 squamous cell carcinoma of the left upper lobe.  This did recur and she had thorascopic lingula sparing left upper lobectomy performed in 2019 for T1CN0 stage I A3 invasive squamous cell carcinoma consistent with recurrence.  Today she is asymptomatic specifically [place] cough hemoptysis or chest tightness.  A 12 x 9 mm nodular density was noted in the right lower lobe on her CT scan back in July.  She has extremely poor pulmonary reserve and is not a candidate for invasive procedures.  She is seen today for consideration of SBRT.  PLANNED TREATMENT REGIMEN: SBRT  PAST MEDICAL HISTORY:  has a past medical history of Asthma, Cancer ([place]), COPD (chronic obstructive pulmonary disease) ([place]), Shortness of breath dyspnea, Squamous cell carcinoma lung, left ([place]) (2017), Thyroid disease, and Weight decrease (major recent weight loss).    PAST SURGICAL HISTORY:  Past Surgical History:  Procedure  Laterality Date   BREAST BIOPSY Right    COLONOSCOPY     ELECTROMAGNETIC NAVIGATION BROCHOSCOPY N/A 01/02/2016   Procedure: ELECTROMAGNETIC NAVIGATION BRONCHOSCOPY;  Surgeon: Flora Lipps, MD;  Location: [facility] ORS;  Service: Cardiopulmonary;  Laterality: N/A;   ESOPHAGOGASTRODUODENOSCOPY (EGD) WITH PROPOFOL N/A 08/27/2018   Procedure: ESOPHAGOGASTRODUODENOSCOPY (EGD) WITH PROPOFOL;  Surgeon: Lucilla Lame, MD;  Location: [facility] ENDOSCOPY;  Service: Endoscopy;  Laterality: N/A;   INTRAMEDULLARY (IM) NAIL INTERTROCHANTERIC Right 11/03/2017   Procedure: INTRAMEDULLARY (IM) NAIL INTERTROCHANTRIC;  Surgeon: Leim Fabry, MD;  Location: [facility] ORS;  Service: Orthopedics;  Laterality: Right;    FAMILY HISTORY: family history includes Cirrhosis in her father; Lung cancer in her sister.  SOCIAL HISTORY:  reports that she has been smoking cigarettes. She has a 100.00 pack-year smoking history. She has never used smokeless tobacco. She reports that she does not currently use alcohol. She reports that she does not use drugs.  ALLERGIES: Levaquin [levofloxacin]  MEDICATIONS:  Current Outpatient Medications  Medication Sig Dispense Refill   acetaminophen (TYLENOL) 500 MG tablet Take 500-1,000 mg by mouth every 8 (eight) hours as needed for mild constipation or moderate pain.     aspirin 81 MG EC tablet Take 1 tablet by mouth daily.     cholecalciferol (VITAMIN D3) 25 MCG (1000 UNIT) tablet Take 1,000 Units by mouth daily.     dronabinol (MARINOL) 2.5 MG capsule TAKE ONE CAPSULE TWICE A DAY BEFORE MEALS 180 capsule 0   Fluticasone-Umeclidin-Vilant (TRELEGY ELLIPTA) 100-62.5-25 MCG/ACT AEPB Inhale 1 puff into the lungs daily. 14 each  0   Iron-Vitamin C 65-125 MG TABS Take 1 tablet by mouth in the morning and at bedtime. (Patient not taking: Reported on 04/03/2022) 180 tablet 4   levothyroxine (SYNTHROID) 25 MCG tablet TAKE 1 TABLET EVERY DAY ON EMPTY STOMACHWITH A GLASS OF WATER AT LEAST 30-60 MINBEFORE BREAKFAST 90  tablet 4   mirtazapine (REMERON) 15 MG tablet Take 1 tablet (15 mg total) by mouth at bedtime. 90 tablet 4   Multiple Vitamin (MULTIVITAMIN WITH MINERALS) TABS tablet Take 1 tablet by mouth daily. 30 tablet 0   rosuvastatin (CRESTOR) 10 MG tablet TAKE 1 TABLET BY MOUTH DAILY. 90 tablet 2   VENTOLIN HFA 108 (90 Base) MCG/ACT inhaler INHALE TWO PUFFS EVERY SIX HOURS AS NEEDED FOR WHEEZING OR SHORTNESS OF BREATH 18 g 4   No current facility-administered medications for this encounter.    ECOG PERFORMANCE STATUS:  0 - Asymptomatic  REVIEW OF SYSTEMS: Patient does have marked COPD history of EtOH abuse history of tobacco dependency prior history of lung cancer history of COPD with shortness of breath. Patient denies any weight loss, fatigue, weakness, fever, chills or night sweats. Patient denies any loss of vision, blurred vision. Patient denies any ringing  of the ears or hearing loss. No irregular heartbeat. Patient denies heart murmur or history of fainting. Patient denies any chest pain or pain radiating to her upper extremities. Patient denies any shortness of breath, difficulty breathing at night, cough or hemoptysis. Patient denies any swelling in the lower legs. Patient denies any nausea vomiting, vomiting of blood, or coffee ground material in the vomitus. Patient denies any stomach pain. Patient states has had normal bowel movements no significant constipation or diarrhea. Patient denies any dysuria, hematuria or significant nocturia. Patient denies any problems walking, swelling in the joints or loss of balance. Patient denies any skin changes, loss of hair or loss of weight. Patient denies any excessive worrying or anxiety or significant depression. Patient denies any problems with insomnia. Patient denies excessive thirst, polyuria, polydipsia. Patient denies any swollen glands, patient denies easy bruising or easy bleeding. Patient denies any recent infections, allergies or URI. Patient "s  visual fields have not changed significantly in recent time.   PHYSICAL EXAM: BP (!) 127/59   Pulse 82   Temp 98 F (36.7 C)   Resp 20   Ht 4\' 11"  (1.499 m)   Wt 97 lb 3.2 oz (44.1 kg)   BMI 19.63 kg/m  Thin female in NAD.  Well-developed well-nourished patient in NAD. HEENT reveals PERLA, EOMI, discs not visualized.  Oral cavity is clear. No oral mucosal lesions are identified. Neck is clear without evidence of cervical or supraclavicular adenopathy. Lungs are clear to A&P. Cardiac examination is essentially unremarkable with regular rate and rhythm without murmur rub or thrill. Abdomen is benign with no organomegaly or masses noted. Motor sensory and DTR levels are equal and symmetric in the upper and lower extremities. Cranial nerves II through XII are grossly intact. Proprioception is intact. No peripheral adenopathy or edema is identified. No motor or sensory levels are noted. Crude visual fields are within normal range.  LABORATORY DATA: Labs and pathology reports reviewed    RADIOLOGY RESULTS: CT scan and PET CT scan reviewed compatible with above-stated findings   IMPRESSION: New stage I non-small cell lung cancer right lower lobe in 61 year old female with history of prior left upper lobe squamous cell carcinoma  PLAN: At this time of offering SBRT to her right lower lobe lesion.  We will plan on delivering 60 Gray in 5 fractions.  Motion restriction for dimensional treatment planning will be used in treatment planning.  Risks and benefits of treatment occluding possible development of cough slight fatigue all reviewed with the patient and her daughters.  They comprehend our treatment plan well.  I have personally set up and ordered CT simulation for next week.  I would like to take this opportunity to thank you for allowing me to participate in the care of your patient.Noreene Filbert, MD

## 2022-04-16 NOTE — Telephone Encounter (Signed)
Spoke to patient's daughter, Susan(DPR). She is requesting to pick up copy of PFT.  She is aware that copy has been placed up front for pick up. Nothing further needed.

## 2022-04-17 NOTE — Progress Notes (Signed)
Attempted to call patient x 3, appears this was reviewed and discussed at recent oncology visit on review.

## 2022-04-18 ENCOUNTER — Other Ambulatory Visit: Payer: Self-pay | Admitting: Nurse Practitioner

## 2022-04-19 NOTE — Telephone Encounter (Signed)
Requested medication (s) are due for refill today: yes  Requested medication (s) are on the active medication list: yes  Last refill:  01/17/22 #180/0  Future visit scheduled: yes  Notes to clinic:  Unable to refill per protocol, medication not assigned to the refill protocol.    Requested Prescriptions  Pending Prescriptions Disp Refills   dronabinol (MARINOL) 2.5 MG capsule [Pharmacy Med Name: DRONABINOL 2.5 MG CAP] 180 capsule     Sig: TAKE ONE CAPSULE TWICE A DAY BEFORE MEALS     Off-Protocol Failed - 04/18/2022  5:41 PM      Failed - Medication not assigned to a protocol, review manually.      Passed - Valid encounter within last 12 months    Recent Outpatient Visits           1 month ago Centrilobular emphysema ([place])   [place] Cannady, Henrine Screws T, NP   7 months ago Centrilobular emphysema ([place])   [place], Henrine Screws T, NP   1 year ago Centrilobular emphysema ([place])   [place], Barbaraann Faster, NP   1 year ago Centrilobular emphysema ([place])   [place], Barbaraann Faster, NP   1 year ago Centrilobular emphysema ([place])   [place], Barbaraann Faster, NP       Future Appointments             In 5 months Cannady, Barbaraann Faster, NP [employer]

## 2022-04-23 ENCOUNTER — Ambulatory Visit
Admission: RE | Admit: 2022-04-23 | Discharge: 2022-04-23 | Disposition: A | Discharge status: Home / Self Care | Payer: Medicare Other | Source: Ambulatory Visit | Attending: Radiation Oncology | Admitting: Radiation Oncology

## 2022-04-23 DIAGNOSIS — R918 Other nonspecific abnormal finding of lung field: Secondary | ICD-10-CM | POA: Diagnosis not present

## 2022-04-24 DIAGNOSIS — R918 Other nonspecific abnormal finding of lung field: Secondary | ICD-10-CM | POA: Diagnosis not present

## 2022-04-30 ENCOUNTER — Institutional Professional Consult (permissible substitution): Payer: Medicare Other | Admitting: Pulmonary Disease

## 2022-04-30 ENCOUNTER — Other Ambulatory Visit: Payer: Self-pay

## 2022-04-30 ENCOUNTER — Ambulatory Visit
Admission: RE | Admit: 2022-04-30 | Discharge: 2022-04-30 | Disposition: A | Discharge status: Home / Self Care | Payer: Medicare Other | Source: Ambulatory Visit | Attending: Radiation Oncology | Admitting: Radiation Oncology

## 2022-04-30 DIAGNOSIS — R918 Other nonspecific abnormal finding of lung field: Secondary | ICD-10-CM | POA: Diagnosis not present

## 2022-04-30 LAB — RAD ONC ARIA SESSION SUMMARY
Course Elapsed Days: 0
Plan Fractions Treated to Date: 1
Plan Prescribed Dose Per Fraction: 12 Gy
Plan Total Fractions Prescribed: 5
Plan Total Prescribed Dose: 60 Gy
Reference Point Dosage Given to Date: 12 Gy
Reference Point Session Dosage Given: 12 Gy
Session Number: 1

## 2022-05-02 ENCOUNTER — Ambulatory Visit
Admission: RE | Admit: 2022-05-02 | Discharge: 2022-05-02 | Disposition: A | Discharge status: Home / Self Care | Payer: Medicare Other | Source: Ambulatory Visit | Attending: Radiation Oncology | Admitting: Radiation Oncology

## 2022-05-02 ENCOUNTER — Other Ambulatory Visit: Payer: Self-pay

## 2022-05-02 DIAGNOSIS — R918 Other nonspecific abnormal finding of lung field: Secondary | ICD-10-CM | POA: Diagnosis not present

## 2022-05-02 LAB — RAD ONC ARIA SESSION SUMMARY
Course Elapsed Days: 2
Plan Fractions Treated to Date: 2
Plan Prescribed Dose Per Fraction: 12 Gy
Plan Total Fractions Prescribed: 5
Plan Total Prescribed Dose: 60 Gy
Reference Point Dosage Given to Date: 24 Gy
Reference Point Session Dosage Given: 12 Gy
Session Number: 2

## 2022-05-07 ENCOUNTER — Other Ambulatory Visit: Payer: Self-pay

## 2022-05-07 ENCOUNTER — Ambulatory Visit
Admission: RE | Admit: 2022-05-07 | Discharge: 2022-05-07 | Disposition: A | Discharge status: Home / Self Care | Payer: Medicare Other | Source: Ambulatory Visit | Attending: Radiation Oncology | Admitting: Radiation Oncology

## 2022-05-07 DIAGNOSIS — R0602 Shortness of breath: Secondary | ICD-10-CM | POA: Diagnosis not present

## 2022-05-07 DIAGNOSIS — Z510 Encounter for antineoplastic radiation therapy: Secondary | ICD-10-CM | POA: Diagnosis present

## 2022-05-07 DIAGNOSIS — F17210 Nicotine dependence, cigarettes, uncomplicated: Secondary | ICD-10-CM | POA: Diagnosis not present

## 2022-05-07 DIAGNOSIS — Z85118 Personal history of other malignant neoplasm of bronchus and lung: Secondary | ICD-10-CM | POA: Insufficient documentation

## 2022-05-07 DIAGNOSIS — J449 Chronic obstructive pulmonary disease, unspecified: Secondary | ICD-10-CM | POA: Diagnosis not present

## 2022-05-07 DIAGNOSIS — Z79899 Other long term (current) drug therapy: Secondary | ICD-10-CM | POA: Diagnosis not present

## 2022-05-07 DIAGNOSIS — R918 Other nonspecific abnormal finding of lung field: Secondary | ICD-10-CM | POA: Insufficient documentation

## 2022-05-07 LAB — RAD ONC ARIA SESSION SUMMARY
Course Elapsed Days: 7
Plan Fractions Treated to Date: 3
Plan Prescribed Dose Per Fraction: 12 Gy
Plan Total Fractions Prescribed: 5
Plan Total Prescribed Dose: 60 Gy
Reference Point Dosage Given to Date: 36 Gy
Reference Point Session Dosage Given: 12 Gy
Session Number: 3

## 2022-05-09 ENCOUNTER — Other Ambulatory Visit: Payer: Self-pay

## 2022-05-09 ENCOUNTER — Ambulatory Visit
Admission: RE | Admit: 2022-05-09 | Discharge: 2022-05-09 | Disposition: A | Discharge status: Home / Self Care | Payer: Medicare Other | Source: Ambulatory Visit | Attending: Radiation Oncology | Admitting: Radiation Oncology

## 2022-05-09 DIAGNOSIS — Z510 Encounter for antineoplastic radiation therapy: Secondary | ICD-10-CM | POA: Diagnosis not present

## 2022-05-09 LAB — RAD ONC ARIA SESSION SUMMARY
Course Elapsed Days: 9
Plan Fractions Treated to Date: 4
Plan Prescribed Dose Per Fraction: 12 Gy
Plan Total Fractions Prescribed: 5
Plan Total Prescribed Dose: 60 Gy
Reference Point Dosage Given to Date: 48 Gy
Reference Point Session Dosage Given: 12 Gy
Session Number: 4

## 2022-05-14 ENCOUNTER — Other Ambulatory Visit: Payer: Self-pay

## 2022-05-14 ENCOUNTER — Ambulatory Visit
Admission: RE | Admit: 2022-05-14 | Discharge: 2022-05-14 | Disposition: A | Discharge status: Home / Self Care | Payer: Medicare Other | Source: Ambulatory Visit | Attending: Radiation Oncology | Admitting: Radiation Oncology

## 2022-05-14 DIAGNOSIS — Z510 Encounter for antineoplastic radiation therapy: Secondary | ICD-10-CM | POA: Diagnosis not present

## 2022-05-14 LAB — RAD ONC ARIA SESSION SUMMARY
Course Elapsed Days: 14
Plan Fractions Treated to Date: 5
Plan Prescribed Dose Per Fraction: 12 Gy
Plan Total Fractions Prescribed: 5
Plan Total Prescribed Dose: 60 Gy
Reference Point Dosage Given to Date: 60 Gy
Reference Point Session Dosage Given: 12 Gy
Session Number: 5

## 2022-05-16 ENCOUNTER — Other Ambulatory Visit: Payer: Self-pay | Admitting: Nurse Practitioner

## 2022-05-17 NOTE — Telephone Encounter (Signed)
Requested medication (s) are due for refill today: yes  Requested medication (s) are on the active medication list: no  Last refill:  unknown  Future visit scheduled: no  Notes to clinic:  Unable to refill per protocol, Rx expired.  Medication is not on current list, routing for review.     Requested Prescriptions  Pending Prescriptions Disp Refills   omeprazole (PRILOSEC) 20 MG capsule [Pharmacy Med Name: OMEPRAZOLE DR 20 MG CAP] 90 capsule     Sig: TAKE 1 CAPSULE BY MOUTH ONCE DAILY     Gastroenterology: Proton Pump Inhibitors Passed - 05/16/2022  5:13 PM      Passed - Valid encounter within last 12 months    Recent Outpatient Visits           1 month ago Centrilobular emphysema ([place])   [place] Cannady, Jolene T, NP   8 months ago Centrilobular emphysema ([place])   [place] Cannady, Henrine Screws T, NP   1 year ago Centrilobular emphysema ([place])   [place], Henrine Screws T, NP   1 year ago Centrilobular emphysema ([place])   [place], Henrine Screws T, NP   1 year ago Centrilobular emphysema ([place])   [place], Barbaraann Faster, NP       Future Appointments             In 4 months Cannady, Barbaraann Faster, NP [employer]

## 2022-05-22 ENCOUNTER — Ambulatory Visit: Payer: Medicare Other | Admitting: Radiation Oncology

## 2022-05-22 ENCOUNTER — Other Ambulatory Visit: Payer: Self-pay | Admitting: Nurse Practitioner

## 2022-05-23 NOTE — Telephone Encounter (Signed)
Requested Prescriptions  Pending Prescriptions Disp Refills  . VENTOLIN HFA 108 (90 Base) MCG/ACT inhaler [Pharmacy Med Name: VENTOLIN HFA 108 (90 BASE) MCG/ACT] 18 g 4    Sig: INHALE TWO PUFFS EVERY SIX HOURS AS NEEDED FOR WHEEZING OR SHORTNESS OF BREATH     Pulmonology:  Beta Agonists 2 Passed - 05/22/2022 11:35 AM      Passed - Last BP in normal range    BP Readings from Last 1 Encounters:  04/16/22 (!) 127/59         Passed - Last Heart Rate in normal range    Pulse Readings from Last 1 Encounters:  04/16/22 82         Passed - Valid encounter within last 12 months    Recent Outpatient Visits          2 months ago Centrilobular emphysema ([place] Rangel)   [place] Rangel, Lori T, NP   8 months ago Centrilobular emphysema ([place])   [place] Rangel, Lori Screws T, NP   1 year ago Centrilobular emphysema ([place])   L[place] Rangel, Lori Screws T, NP   1 year ago Centrilobular emphysema ([place])   Lori Rangel, Lori Screws T, NP   1 year ago Centrilobular emphysema ([place])   Lori Rangel, Lori Faster, NP      Future Appointments            In 4 months Rangel, Lori Faster, NP [employer]

## 2022-06-03 ENCOUNTER — Other Ambulatory Visit: Payer: Self-pay | Admitting: Nurse Practitioner

## 2022-06-04 NOTE — Telephone Encounter (Signed)
Requested Prescriptions  Pending Prescriptions Disp Refills  . levothyroxine (SYNTHROID) 25 MCG tablet [Pharmacy Med Name: LEVOTHYROXINE SODIUM 25 MCG TAB] 90 tablet 3    Sig: TAKE 1 TABLET EVERY DAY ON EMPTY STOMACHWITH A GLASS OF WATER AT LEAST 30-60 [place]     Endocrinology:  Hypothyroid Agents Passed - 06/03/2022  6:41 PM      Passed - TSH in normal range and within 360 days    TSH  Date Value Ref Range Status  03/19/2022 0.708 0.450 - 4.500 uIU/mL Final         Passed - Valid encounter within last 12 months    Recent Outpatient Visits          2 months ago Centrilobular emphysema ([place])   [place] Cannady, Jolene T, NP   8 months ago Centrilobular emphysema ([place])   [place] Cannady, Henrine Screws T, NP   1 year ago Centrilobular emphysema ([place])   [place] Cannady, Henrine Screws T, NP   1 year ago Centrilobular emphysema ([place])   [place], Henrine Screws T, NP   1 year ago Centrilobular emphysema ([place])   [place], Barbaraann Faster, NP      Future Appointments            In 3 months Cannady, Barbaraann Faster, NP [employer]

## 2022-06-07 ENCOUNTER — Ambulatory Visit (INDEPENDENT_AMBULATORY_CARE_PROVIDER_SITE_OTHER): Payer: Medicare Other | Admitting: Pulmonary Disease

## 2022-06-07 ENCOUNTER — Encounter: Payer: Self-pay | Admitting: Pulmonary Disease

## 2022-06-07 VITALS — BP 110/60 | HR 75 | Temp 97.8°F | Ht 59.0 in | Wt 105.4 lb

## 2022-06-07 DIAGNOSIS — R0989 Other specified symptoms and signs involving the circulatory and respiratory systems: Secondary | ICD-10-CM

## 2022-06-07 DIAGNOSIS — R911 Solitary pulmonary nodule: Secondary | ICD-10-CM | POA: Diagnosis not present

## 2022-06-07 DIAGNOSIS — J449 Chronic obstructive pulmonary disease, unspecified: Secondary | ICD-10-CM

## 2022-06-07 DIAGNOSIS — Z85118 Personal history of other malignant neoplasm of bronchus and lung: Secondary | ICD-10-CM | POA: Diagnosis not present

## 2022-06-07 DIAGNOSIS — F17201 Nicotine dependence, unspecified, in remission: Secondary | ICD-10-CM | POA: Diagnosis not present

## 2022-06-07 MED ORDER — TRELEGY ELLIPTA 100-62.5-25 MCG/ACT IN AEPB: 1.0000 | INHALATION_SPRAY | Freq: Every day | RESPIRATORY_TRACT | 0 refills | Status: DC

## 2022-06-07 MED ORDER — TRELEGY ELLIPTA 100-62.5-25 MCG/ACT IN AEPB: 1.0000 | INHALATION_SPRAY | Freq: Every day | RESPIRATORY_TRACT | 11 refills | Status: DC

## 2022-06-07 NOTE — Progress Notes (Signed)
Subjective:    Patient ID: Lori Rangel, female    DOB: [birth year], 61 y.o.   MRN: [id number] Patient Care Team: Marjie Skiff, NP as PCP - General (Nurse Practitioner) Antonieta Iba, MD as Consulting Physician (Cardiology) Salena Saner, MD as Consulting Physician (Pulmonary Disease) Carmina Miller, MD as Consulting Physician (Radiation Oncology)  Chief Complaint  Patient presents with   Follow-up    Lung nodule. No SOB, wheezing or cough.    HPI Patient is a 61 year old recent former smoker smoker (100 PY) with a history as noted below, who presents for follow-up of a right lung nodule.  Recall that in 2017 the patient developed a left upper lobe nodule, she underwent electromagnetic navigational bronchoscopy at [facility] and this was nondiagnostic.  Subsequently she was referred to thoracic surgery at [facility] where she underwent evaluation with percutaneous needle biopsy which resulted in pneumothorax.  She subsequently underwent referral to radiation oncology as she was not an operative candidate.  Per the family's information, the patient did have recurrence after initial radiation and she required a second round of radiation to the left lung.  That left lung has significant volume loss due to postradiation changes.  The patient had been followed up at [facility] last of which was performed on 2 March.  She was to have repeat CT however the patient and the family requested that care be transferred back to the [place] area.  [name] primary care practitioner Aura Dials, NP ordered a CT scan of the chest which has shown a nodule on the right lung that is 12 mm in diameter.  This looked suspicious for carcinoma.  The patient has not had any hemoptysis.  She has chronic morning cough productive of whitish sputum.  No recent weight loss or anorexia.  Patient is aware that she is not an operative candidate and also aware that invasive procedures are high risk in her situation.  She had  a PET/CT on 12 April 2022 that showed that the pulmonary nodule in the middle right lower lobe was FDG avid concerning for malignancy.  She also has stable postsurgical changes of the left upper lobectomy with volume loss and in the left hemithorax.  She was then referred to radiation oncology for empiric SBRT which she has completed.  She tolerated the therapy well.   [name] only other symptoms are those of dyspnea.  At her prior visit she was switched to Trelegy Ellipta and she notes that this medication helped her.  She however ran out of the samples and did not notify us that she was doing well with this so she went back to using just as needed albuterol.  She does not endorse any other symptomatology.  No chest pain.  No lower extremity edema and no calf tenderness.   Since her prior visit she has discontinued smoking.  She had difficulties with alcohol abuse in the past however she has discontinued drinking and has been abstinent for years.    Review of Systems A 10 point review of systems was performed and it is as noted above otherwise negative.  Patient Active Problem List   Diagnosis Date Noted   GERD without esophagitis 09/19/2021   Mixed hyperlipidemia 03/09/2021   Iron deficiency anemia secondary to inadequate dietary iron intake 07/12/2020   Hypothyroidism 07/12/2020   Medication monitoring encounter 07/12/2020   History of lung cancer 07/07/2020   History of alcohol abuse 10/09/2018   Uses walker 06/03/2018   History  of fracture of right hip 11/03/2017   Thoracic aortic atherosclerosis (HCC) 06/07/2016   COPD with emphysema (HCC) 01/25/2016   Carotid stenosis 12/31/2015   Nicotine dependence, cigarettes, w unsp disorders 12/31/2015   Right lower lobe pulmonary nodule 12/31/2015   Moderate protein-calorie malnutrition (HCC) 12/17/2015   Social History   Tobacco Use   Smoking status: Former    Packs/day: 2.00    Years: 50.00    Additional pack years: 0.00     Total pack years: 100.00    Types: Cigarettes    Quit date: 04/03/2022    Years since quitting: 0.7   Smokeless tobacco: Never   Tobacco comments:    Quit on 04/03/2022  Substance Use Topics   Alcohol use: Not Currently    Alcohol/week: 0.0 standard drinks of alcohol   Allergies  Allergen Reactions   Levaquin [Levofloxacin] Nausea Only    Cramps & Body aches   Current Meds  Medication Sig   acetaminophen (TYLENOL) 500 MG tablet Take 500-1,000 mg by mouth every 8 (eight) hours as needed for mild constipation or moderate pain.   aspirin 81 MG EC tablet Take 1 tablet by mouth daily.   cholecalciferol (VITAMIN D3) 25 MCG (1000 UNIT) tablet Take 1,000 Units by mouth daily.   dronabinol (MARINOL) 2.5 MG capsule TAKE ONE CAPSULE TWICE A DAY BEFORE MEALS   Fluticasone-Umeclidin-Vilant (TRELEGY ELLIPTA) 100-62.5-25 MCG/ACT AEPB Inhale 1 puff into the lungs daily.   Fluticasone-Umeclidin-Vilant (TRELEGY ELLIPTA) 100-62.5-25 MCG/ACT AEPB Inhale 1 puff into the lungs daily.   levothyroxine (SYNTHROID) 25 MCG tablet TAKE 1 TABLET EVERY DAY ON EMPTY STOMACHWITH A GLASS OF WATER AT LEAST 30-60 MINBEFORE BREAKFAST   mirtazapine (REMERON) 15 MG tablet Take 1 tablet (15 mg total) by mouth at bedtime.   Multiple Vitamin (MULTIVITAMIN WITH MINERALS) TABS tablet Take 1 tablet by mouth daily.   omeprazole (PRILOSEC) 20 MG capsule TAKE 1 CAPSULE BY MOUTH ONCE DAILY   rosuvastatin (CRESTOR) 10 MG tablet TAKE 1 TABLET BY MOUTH DAILY.   VENTOLIN HFA 108 (90 Base) MCG/ACT inhaler INHALE TWO PUFFS EVERY SIX HOURS AS NEEDED FOR WHEEZING OR SHORTNESS OF BREATH   Immunization History  Administered Date(s) Administered   Influenza Inj Mdck Quad Pf 06/03/2018   Influenza Split 07/04/2015   Influenza,inj,Quad PF,6+ Mos 06/03/2018, 06/15/2019   Influenza-Unspecified 06/06/2016, 06/21/2020, 07/18/2021   Moderna Sars-Covid-2 Vaccination 05/27/2020, 06/24/2020   Pfizer Covid-19 Vaccine Bivalent Booster 56yrs & up  12/23/2020   Pneumococcal Polysaccharide-23 09/08/2020       Objective:   Physical Exam BP 110/60 (BP Location: Left Arm, Cuff Size: Normal)   Pulse 75   Temp 97.8 F (36.6 C)   Ht 4\' 11"  (1.499 m)   Wt 105 lb 6.4 oz (47.8 kg)   SpO2 95%   BMI 21.29 kg/m  GENERAL: Debilitated, frail-appearing woman, looks markedly older than stated age, no acute distress, ambulates with walker. HEAD: Normocephalic, atraumatic.  EYES: Pupils equal, round, reactive to light.  No scleral icterus.  MOUTH: Poor dentition missing teeth lower, teeth in poor repair upper, pharynx clear. NECK: Supple. No thyromegaly. Trachea midline. No JVD.  No adenopathy. PULMONARY: Distant breath sounds bilaterally.  Faint end expiratory wheezes throughout, no rhonchi. CARDIOVASCULAR: S1 and S2. Regular rate and rhythm.  ABDOMEN: Benign. MUSCULOSKELETAL: No joint deformity, no clubbing, no edema.  NEUROLOGIC: Unsteady gait, requires walker.  No overt localizing finding. SKIN: Intact,warm,dry. PSYCH: Mood and behavior normal.     Assessment & Plan:  ICD-10-CM   1. COPD suggested by initial evaluation (HCC)  J44.9    Resume Trelegy 100, 1 inhalation daily Continue as needed albuterol    2. Lung nodule  R91.1    Underwent empiric SBRT Not candidate for invasive procedures    3. History of lung cancer - Squamous cell LUL  Z85.118    This issue adds complexity to her management    4. 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    Dispense:  28 each    Refill:  0   Fluticasone-Umeclidin-Vilant (TRELEGY ELLIPTA) 100-62.5-25 MCG/ACT AEPB    Sig: Inhale 1 puff into the lungs daily.    Dispense:  28 each    Refill:  11   The patient in follow-up in 3 months time she is to contact  us prior to that time should any new difficulties arise.  Gailen Shelter, MD Advanced Bronchoscopy PCCM [facility]    *This note was dictated using voice recognition software/Dragon.  Despite best efforts to proofread, errors can occur which can change the meaning. Any transcriptional errors that result from this process are unintentional and may not be fully corrected at the time of dictation.

## 2022-06-07 NOTE — Patient Instructions (Signed)
We have provided you with samples and a coupon for Trelegy.  Have sent a prescription to your pharmacy for the Trelegy.  We will see you in follow-up in 3 months time call sooner should any new problems arise.

## 2022-06-24 ENCOUNTER — Ambulatory Visit
Admission: RE | Admit: 2022-06-24 | Discharge: 2022-06-24 | Disposition: A | Discharge status: Home / Self Care | Payer: Medicare Other | Source: Ambulatory Visit | Attending: Radiation Oncology | Admitting: Radiation Oncology

## 2022-06-24 ENCOUNTER — Other Ambulatory Visit: Payer: Self-pay | Admitting: *Deleted

## 2022-06-24 VITALS — BP 152/63 | HR 81 | Temp 97.0°F | Resp 18 | Ht 59.0 in | Wt 106.6 lb

## 2022-06-24 DIAGNOSIS — R911 Solitary pulmonary nodule: Secondary | ICD-10-CM | POA: Diagnosis present

## 2022-06-24 NOTE — Progress Notes (Signed)
Radiation Oncology Follow up Note  Name: Lori Rangel   Date:   06/24/2022 MRN:  [id number] DOB: 1960/12/08    This 61 y.o. female presents to the clinic today for 1 month follow-up status post SBRT to her right lower lobe for stage I non-small cell lung cancer and patient previously treated to her left upper lobe for invasive squamous cell carcinoma.  REFERRING PROVIDER: Venita Lick, NP  HPI: Patient is a 61 year old female now at 1 month having completed SBRT to her right lower lobe for a stage I non-small cell lung cancer and patient previously treated to her left upper lobe for invasive squamous cell carcinoma seen today in routine follow-up she is doing well specifically denies cough hemoptysis chest tightness or any change in her pulmonary status..  COMPLICATIONS OF TREATMENT: none  FOLLOW UP COMPLIANCE: keeps appointments   PHYSICAL EXAM:  BP (!) 152/63   Pulse 81   Temp (!) 97 F (36.1 C)   Resp 18   Ht 4\' 11"  (1.499 m)   Wt 106 lb 9.6 oz (48.4 kg)   BMI 21.53 kg/m  Well-developed well-nourished patient in NAD. HEENT reveals PERLA, EOMI, discs not visualized.  Oral cavity is clear. No oral mucosal lesions are identified. Neck is clear without evidence of cervical or supraclavicular adenopathy. Lungs are clear to A&P. Cardiac examination is essentially unremarkable with regular rate and rhythm without murmur rub or thrill. Abdomen is benign with no organomegaly or masses noted. Motor sensory and DTR levels are equal and symmetric in the upper and lower extremities. Cranial nerves II through XII are grossly intact. Proprioception is intact. No peripheral adenopathy or edema is identified. No motor or sensory levels are noted. Crude visual fields are within normal range.  RADIOLOGY RESULTS: CT scan of the chest ordered  PLAN: Present time patient is doing well very low side effect profile from her SBRT.  And pleased with her overall progress I have asked to see her back  in 3 months with a follow-up CT scan at that time.  Patient is to call with any concerns.  I would like to take this opportunity to thank you for allowing me to participate in the care of your patient.Noreene Filbert, MD

## 2022-07-16 ENCOUNTER — Other Ambulatory Visit: Payer: Self-pay | Admitting: Nurse Practitioner

## 2022-07-16 NOTE — Telephone Encounter (Signed)
Requested Prescriptions  Pending Prescriptions Disp Refills   dronabinol (MARINOL) 2.5 MG capsule [Pharmacy Med Name: DRONABINOL 2.5 MG CAP] 180 capsule     Sig: TAKE ONE CAPSULE TWICE A DAY BEFORE MEALS     Off-Protocol Failed - 07/16/2022  3:29 PM      Failed - Medication not assigned to a protocol, review manually.      Passed - Valid encounter within last 12 months    Recent Outpatient Visits           3 months ago Centrilobular emphysema ([place])   [place] Cannady, Henrine Screws T, NP   10 months ago Centrilobular emphysema ([place])   [place], Henrine Screws T, NP   1 year ago Centrilobular emphysema ([place])   [place], Barbaraann Faster, NP   1 year ago Centrilobular emphysema ([place])   [place], Henrine Screws T, NP   2 years ago Centrilobular emphysema ([place])   [place], Barbaraann Faster, NP       Future Appointments             In 2 months Cannady, Barbaraann Faster, NP [facility]             rosuvastatin (CRESTOR) 10 MG tablet [employer] Med Name: ROSUVASTATIN CALCIUM 10 MG TAB] 90 tablet 0    Sig: TAKE 1 TABLET BY MOUTH DAILY.     Cardiovascular:  Antilipid - Statins 2 Failed - 07/16/2022  3:29 PM      Failed - Lipid Panel in normal range within the last 12 months    Cholesterol, Total  Date Value Ref Range Status  03/19/2022 159 100 - 199 mg/dL Final   LDL Chol Calc (NIH)  Date Value Ref Range Status  03/19/2022 79 0 - 99 mg/dL Final   HDL  Date Value Ref Range Status  03/19/2022 45 >39 mg/dL Final   Triglycerides  Date Value Ref Range Status  03/19/2022 212 (H) 0 - 149 mg/dL Final         Passed - Cr in normal range and within 360 days    Creatinine  Date Value Ref Range Status  09/19/2021 16.7 (L) 20.0 - 300.0 mg/dL Final   Creatinine, Ser  Date Value Ref Range Status  03/19/2022 0.62 0.57 - 1.00 mg/dL Final         Passed - Patient is not pregnant      Passed  - Valid encounter within last 12 months    Recent Outpatient Visits           3 months ago Centrilobular emphysema ([place])   [place], Jolene T, NP   10 months ago Centrilobular emphysema ([place])   [place], Henrine Screws T, NP   1 year ago Centrilobular emphysema ([place])   [place], Henrine Screws T, NP   1 year ago Centrilobular emphysema ([place])   [place], Henrine Screws T, NP   2 years ago Centrilobular emphysema ([place])   [place], Barbaraann Faster, NP       Future Appointments             In 2 months Cannady, Barbaraann Faster, NP [facility] [facility], [facility]

## 2022-07-16 NOTE — Telephone Encounter (Signed)
Requested medication (s) are due for refill today:yes  Requested medication (s) are on the active medication list: yes  Last refill:  04/19/22  Future visit scheduled: yes  Notes to clinic:  Medication not assigned to a protocol, review manually      Requested Prescriptions  Pending Prescriptions Disp Refills   dronabinol (MARINOL) 2.5 MG capsule [Pharmacy Med Name: DRONABINOL 2.5 MG CAP] 180 capsule     Sig: TAKE ONE CAPSULE TWICE A DAY BEFORE MEALS     Off-Protocol Failed - 07/16/2022  3:29 PM      Failed - Medication not assigned to a protocol, review manually.      Passed - Valid encounter within last 12 months    Recent Outpatient Visits           3 months ago Centrilobular emphysema ([place])   [place] Cannady, Henrine Screws T, NP   10 months ago Centrilobular emphysema ([place])   [place], Henrine Screws T, NP   1 year ago Centrilobular emphysema ([place])   [place], Barbaraann Faster, NP   1 year ago Centrilobular emphysema ([place])   [place], Henrine Screws T, NP   2 years ago Centrilobular emphysema ([place])   [place], Barbaraann Faster, NP       Future Appointments             In 2 months Cannady, Barbaraann Faster, NP [facility]            Signed Prescriptions Disp Refills   rosuvastatin (CRESTOR) 10 MG tablet 90 tablet 0    Sig: TAKE 1 TABLET BY MOUTH DAILY.     Cardiovascular:  Antilipid - Statins 2 Failed - 07/16/2022  3:29 PM      Failed - Lipid Panel in normal range within the last 12 months    Cholesterol, Total  Date Value Ref Range Status  03/19/2022 159 100 - 199 mg/dL Final   LDL Chol Calc (NIH)  Date Value Ref Range Status  03/19/2022 79 0 - 99 mg/dL Final   HDL  Date Value Ref Range Status  03/19/2022 45 >39 mg/dL Final   Triglycerides  Date Value Ref Range Status  03/19/2022 212 (H) 0 - 149 mg/dL Final         Passed - Cr in normal range and within 360  days    Creatinine  Date Value Ref Range Status  09/19/2021 16.7 (L) 20.0 - 300.0 mg/dL Final   Creatinine, Ser  Date Value Ref Range Status  03/19/2022 0.62 0.57 - 1.00 mg/dL Final         Passed - Patient is not pregnant      Passed - Valid encounter within last 12 months    Recent Outpatient Visits           3 months ago Centrilobular emphysema ([place])   [place], Jolene T, NP   10 months ago Centrilobular emphysema ([place])   [place], Henrine Screws T, NP   1 year ago Centrilobular emphysema ([place])   [place], Henrine Screws T, NP   1 year ago Centrilobular emphysema ([place])   [place], Henrine Screws T, NP   2 years ago Centrilobular emphysema ([place])   [place], Barbaraann Faster, NP       Future Appointments             In 2 months [place], [place] T,  NP [facility]

## 2022-08-19 ENCOUNTER — Other Ambulatory Visit: Payer: Self-pay | Admitting: Nurse Practitioner

## 2022-09-20 ENCOUNTER — Ambulatory Visit: Payer: Medicare Other | Admitting: Nurse Practitioner

## 2022-09-20 ENCOUNTER — Ambulatory Visit: Payer: Medicare Other | Admitting: Primary Care

## 2022-09-20 ENCOUNTER — Ambulatory Visit (INDEPENDENT_AMBULATORY_CARE_PROVIDER_SITE_OTHER): Payer: Medicare Other | Admitting: Nurse Practitioner

## 2022-09-20 ENCOUNTER — Encounter: Payer: Self-pay | Admitting: Nurse Practitioner

## 2022-09-20 VITALS — BP 104/64 | HR 78 | Temp 98.2°F | Ht 59.02 in | Wt 114.4 lb

## 2022-09-20 DIAGNOSIS — J432 Centrilobular emphysema: Secondary | ICD-10-CM | POA: Diagnosis not present

## 2022-09-20 DIAGNOSIS — Z5181 Encounter for therapeutic drug level monitoring: Secondary | ICD-10-CM

## 2022-09-20 DIAGNOSIS — Z85118 Personal history of other malignant neoplasm of bronchus and lung: Secondary | ICD-10-CM

## 2022-09-20 DIAGNOSIS — T148XXA Other injury of unspecified body region, initial encounter: Secondary | ICD-10-CM

## 2022-09-20 DIAGNOSIS — Z0000 Encounter for general adult medical examination without abnormal findings: Secondary | ICD-10-CM

## 2022-09-20 DIAGNOSIS — Z87891 Personal history of nicotine dependence: Secondary | ICD-10-CM

## 2022-09-20 DIAGNOSIS — E440 Moderate protein-calorie malnutrition: Secondary | ICD-10-CM | POA: Diagnosis not present

## 2022-09-20 DIAGNOSIS — D508 Other iron deficiency anemias: Secondary | ICD-10-CM | POA: Diagnosis not present

## 2022-09-20 DIAGNOSIS — F17219 Nicotine dependence, cigarettes, with unspecified nicotine-induced disorders: Secondary | ICD-10-CM

## 2022-09-20 DIAGNOSIS — I700 Atherosclerosis of aorta: Secondary | ICD-10-CM

## 2022-09-20 DIAGNOSIS — E782 Mixed hyperlipidemia: Secondary | ICD-10-CM

## 2022-09-20 DIAGNOSIS — R911 Solitary pulmonary nodule: Secondary | ICD-10-CM

## 2022-09-20 DIAGNOSIS — E039 Hypothyroidism, unspecified: Secondary | ICD-10-CM

## 2022-09-20 DIAGNOSIS — K219 Gastro-esophageal reflux disease without esophagitis: Secondary | ICD-10-CM

## 2022-09-20 DIAGNOSIS — Z23 Encounter for immunization: Secondary | ICD-10-CM

## 2022-09-20 DIAGNOSIS — E559 Vitamin D deficiency, unspecified: Secondary | ICD-10-CM

## 2022-09-20 DIAGNOSIS — Z1231 Encounter for screening mammogram for malignant neoplasm of breast: Secondary | ICD-10-CM

## 2022-09-20 DIAGNOSIS — E538 Deficiency of other specified B group vitamins: Secondary | ICD-10-CM

## 2022-09-20 DIAGNOSIS — I6523 Occlusion and stenosis of bilateral carotid arteries: Secondary | ICD-10-CM

## 2022-09-20 MED ORDER — ROSUVASTATIN CALCIUM 10 MG PO TABS: 10.0000 mg | ORAL_TABLET | Freq: Every day | ORAL | 4 refills | Status: DC

## 2022-09-20 MED ORDER — MIRTAZAPINE 15 MG PO TABS
15.0000 mg | ORAL_TABLET | Freq: Every day | ORAL | 4 refills | Status: DC
Start: 2022-09-20 — End: 2023-09-16

## 2022-09-20 MED ORDER — DRONABINOL 2.5 MG PO CAPS: ORAL_CAPSULE | ORAL | 0 refills | Status: DC

## 2022-09-20 MED ORDER — OMEPRAZOLE 20 MG PO CPDR: 20.0000 mg | DELAYED_RELEASE_CAPSULE | Freq: Every day | ORAL | 4 refills | Status: DC

## 2022-09-20 NOTE — Patient Instructions (Addendum)
Please call to schedule your mammogram and/or bone density: [facility] [place]: [address], [place], [place] [address] Phone: [phone]  [place] [facility] [address] [place],  [place]  [address] Phone: [phone]    Living with COPD Being diagnosed with chronic obstructive pulmonary disease (COPD) changes your life physically and emotionally. Having COPD can affect your ability to work and do things you enjoy. COPD is not the same for everyone, and it may change over time. Your health care providers can help you come up with the COPD management plan that works best for you. How to manage lifestyle changes Treatment plan Work closely with your health care providers. Follow your COPD management plan. This plan includes: Instructions about activities, exercises, diet, medicines, what to do when COPD flares up, and when to call your health care provider. A pulmonary rehabilitation program. In pulmonary rehab, you will learn about COPD, do exercises for fitness and breathing, and get support from health care providers and other people who have COPD. Managing emotions and stress Living with a chronic disease means you may also struggle with stressful emotions, such as sadness, fear, and worry. Here are some ways to manage these emotions: Talk to someone about your fear, anxiety, depression, or stress. Learn strategies to avoid or reduce stress and ask for help if you are struggling with depression or anxiety. Consider joining a COPD support group, online or in person.  Adjusting to changes COPD may limit the things you can do, but you can make certain changes to help you cope with the diagnosis. Ask for help when you need it. Getting support from friends, family, and your health care team is an important part of managing the condition. Try to get regular exercise as prescribed by a health care provider or  pulmonary rehab team. Exercising can help COPD, even if you are a bit short of breath. Take steps to prevent infection and protect your lungs: Wash your hands often and avoid being in crowds. Stay away from friends and family members who are sick. Check your local air quality each day, and stay out of areas where air pollution is likely. How to recognize changes in your condition Recognizing changes in your COPD COPD is a progressive disease. It is important to let the health care team know if your COPD is getting worse. Your treatment plan may need to change. Watch for: Increased shortness of breath, wheezing, cough, or fatigue. Loss of ability to exercise or perform daily activities, like climbing stairs. More frequent symptom flares. Signs of depression or anxiety. Recognizing stress It is normal to have additional stress when you have COPD. However, prolonged stress and anxiety can make COPD worse and lead to depression. Recognize the warning signs, which include: Feeling sad or worried more often or most of the time. Having less energy and losing interest in pleasurable activities. Changes in your appetite or sleeping patterns. Being easily angered or irritated. Having unexplained aches and pains, digestive problems, or headaches. Follow these instructions at home: Eating and drinking  Eat foods that are high in fiber, such as fresh fruits and vegetables, whole grains, and beans. Limit foods that are high in fat and processed sugars, such as fried or sweet foods. Follow a balanced diet and maintain a healthy weight. Being overweight or underweight can make COPD worse. You may work with a Microbiologist as part of your pulmonary rehab program. Drink enough  fluid to keep your urine pale yellow. If you drink alcohol: Limit how much you have to: 0-1 drink a day for women who are not pregnant. 0-2 drinks a day for men. Know how much alcohol is in your drink. In the [place] one drink equals one  12 oz bottle of beer (355 mL), one 5 oz glass of wine (148 mL), or one 1 oz glass of hard liquor (44 mL). Lifestyle If you smoke, the most important thing that you can do is to stop smoking. Continuing to smoke will cause the disease to progress faster. Do not use any products that contain nicotine or tobacco. These products include cigarettes, chewing tobacco, and vaping devices, such as e-cigarettes. If you need help quitting, ask your health care provider. Avoid exposure to things that irritate your lungs, such as smoke, chemicals, and fumes. Activity Balance exercise and rest. Take short walks every 1-2 hours. This is important to improve blood flow and breathing. Ask for help if you feel weak or unsteady. Do exercises that include controlled breathing with body movement, such as tai chi. General instructions Take over-the-counter and prescription medicines only as told by your health care provider. Take vitamin and protein supplements as told by your health care provider or dietitian. Practice good oral hygiene and see your dental care provider regularly. An oral infection can also spread to your lungs. Make sure you receive all the vaccines that your health care provider recommends. Keep all follow-up visits. This is important. Contact a health care provider if you: Are struggling to manage your COPD. Have emotional stress that interferes with your ability to cope with COPD. Get help right away if you: Have thoughts of suicide, death, or hurting yourself or others. If you ever feel like you may hurt yourself or others, or have thoughts about taking your own life, get help right away. Go to your nearest emergency department or: Call your local emergency services (911 in the [place] Call a suicide crisis helpline, such as the [place] at 785 142 3553 or 988 in the [place]. This is open 24 hours a day in the [place] Text the Crisis Text Line at 310-379-1371 (in the  [place].). Summary Being diagnosed with chronic obstructive pulmonary disease (COPD) changes your life physically and emotionally. Work with your health care providers and follow your COPD management plan. A pulmonary rehabilitation program is an important part of COPD management. Prolonged stress, anxiety, and depression can make COPD worse. Let your health care provider know if emotional stress interferes with your ability to cope with and manage COPD. This information is not intended to replace advice given to you by your health care provider. Make sure you discuss any questions you have with your health care provider. Document Revised: 03/14/2021 Document Reviewed: 09/06/2020 Elsevier Patient Education  [place].

## 2022-09-20 NOTE — Assessment & Plan Note (Signed)
Chronic, ongoing.  Followed by pulmonary, continue this collaboration.  She wishes to return to [place] use and stop Trelegy, felt more benefit from [facility] -- she will discuss with pulmonary at visit today.  Praised for quitting smoking.

## 2022-09-20 NOTE — Assessment & Plan Note (Signed)
Ongoing issue, post lung CA.  Is gaining at this time.  Will continue Mirtazapine and Dronabinol, as currently prescribed.  This is offering benefit to her appetite and maintaining weight.  She is aware Dronabinol is a controlled substance and will require yearly drug screen and controlled substance contract, which she is agreeable to. UDS which is due today and obtain contract next visit.

## 2022-09-20 NOTE — Assessment & Plan Note (Signed)
Ongoing, monitored by pulmonary and oncology, continue this collaboration.

## 2022-09-20 NOTE — Assessment & Plan Note (Signed)
Chronic, ongoing stable with Omeprazole.  Has tried reduction in past with return of symptoms. Continue current medication regimen and adjust as needed.  Mag level annually. Risks of PPI use were discussed with patient including bone loss, C. Diff diarrhea, pneumonia, infections, CKD, electrolyte abnormalities.  Verbalizes understanding and chooses to continue the medication.

## 2022-09-20 NOTE — Assessment & Plan Note (Signed)
Chronic, ongoing.  Continue current medication regimen and adjust as needed. Lipid panel today. 

## 2022-09-20 NOTE — Assessment & Plan Note (Signed)
Followed by pulmonary and oncology, continue this collaboration.  Recent notes reviewed.

## 2022-09-20 NOTE — Assessment & Plan Note (Signed)
Chronic, stable.  Noted on past imaging in 2017.  Recommend complete cessation of smoking.  Continue statin at low dose daily and Baby ASA for preventative care.

## 2022-09-20 NOTE — Progress Notes (Signed)
BP 104/64   Pulse 78   Temp 98.2 F (36.8 C) (Oral)   Ht 4' 11.02" (1.499 m)   Wt 114 lb 6.4 oz (51.9 kg)   SpO2 93%   BMI 23.09 kg/m    Subjective:    Patient ID: Lori Rangel, female    DOB: 10-Jul-1961, 62 y.o.   MRN: [id number]  HPI: Lori Rangel is a 62 y.o. female presenting on 09/20/2022 for Medicare Wellness and follow-up. Current medical complaints include:none  She currently lives with: self Menopausal Symptoms: no  LUNG CA LEFT UPPER LOBE + EMPHYSEMA: History of lung cancer left lobe in 2019 with partial lobectomy.  Currently followed by pulmonary, Dr. Jayme Cloud, for right lower lobe nodule -- last visit 04/03/22.  Had radiation therapy to nodule with last treatment 05/14/22.  Saw oncology last 06/24/22.  Continues Trelegy daily, but she would prefer to take Anoro as felt this worked better.  Has history of thoracic aortic atherosclerosis noted on imaging. She quit smoking after finding out about new lung nodule > 6 months ago.  Used to smoke 1 1/2 PPD to 2 PPD. Started smoking at age 68.  She smoked for 49 years.  She takes Mirtazapine and Marinol (last fill 07/16/22) for weight, as is underweight post cancer treatments, this was started by [facility].  Does not drink Ensure at home consistently.   COPD status: stable Satisfied with current treatment?: yes Oxygen use: no Dyspnea frequency: no Cough frequency: no Rescue inhaler frequency: about 2-3 times week Limitation of activity: no Productive cough:  none Last Spirometry: with pulmonary Pneumovax:  Up To Date Influenza: yes  HYPERLIPIDEMIA Continues on Crestor 10 MG daily.   Hyperlipidemia status: good compliance Satisfied with current treatment?  yes Side effects:  no Medication compliance: good compliance Supplements: none Aspirin:  yes The 10-year ASCVD risk score (Arnett DK, et al., 2019) is: 5.2%   Values used to calculate the score:     Age: 37 years     Sex: Female     Is Non-Hispanic African  American: No     Diabetic: No     Tobacco smoker: Yes     Systolic Blood Pressure: 104 mmHg     Is BP treated: No     HDL Cholesterol: 45 mg/dL     Total Cholesterol: 159 mg/dL Chest pain:  no Coronary artery disease:  no Family history CAD:  yes in aunt Family history early CAD:  no   HYPOTHYROIDISM Continues on Levothyroxine 25 MCG daily.   Thyroid control status:stable Satisfied with current treatment? yes Medication side effects: no Medication compliance: good compliance Etiology of hypothyroidism:  Recent dose adjustment:no Fatigue: no Cold intolerance: no Heat intolerance: no Weight gain: no Weight loss: no Constipation: no Diarrhea/loose stools: no Palpitations: no Lower extremity edema: no Anxiety/depressed mood: no   ANEMIA Currently not taking iron daily with Vitamin C tablet - will restart if needed. Is taking Omeprazole for GI protection. Anemia status: stable Etiology of anemia: diet and post cancer treatment Duration of anemia treatment: chronic Compliance with treatment: good compliance Iron supplementation side effects: no Severity of anemia: mild Fatigue: no Decreased exercise tolerance: no  Dyspnea on exertion: no Palpitations: no Bleeding: no Pica: no      09/20/2022    1:28 PM 03/19/2022    3:07 PM 08/16/2021   11:37 AM 09/08/2020    2:20 PM 08/04/2020    9:51 AM  Depression screen PHQ 2/9  Decreased Interest 0  0 0 0 0  Down, Depressed, Hopeless 0 0 0 0 0  PHQ - 2 Score 0 0 0 0 0  Altered sleeping 0 0     Tired, decreased energy 0 0     Change in appetite 0 0     Feeling bad or failure about yourself  0 0     Trouble concentrating 0 0     Moving slowly or fidgety/restless 0 0     Suicidal thoughts 0 0     PHQ-9 Score 0 0     Difficult doing work/chores Not difficult at all Not difficult at all            03/19/2022    3:07 PM 04/16/2022   10:55 AM 06/24/2022   11:17 AM 09/20/2022    1:27 PM 09/20/2022    1:48 PM  Fall Risk  Falls  in the past year? 0   0 0  Was there an injury with Fall? 0   0 0  Fall Risk Category Calculator 0   0 0  Fall Risk Category (Retired) Low      (RETIRED) Patient Fall Risk Level Low fall risk Low fall risk Moderate fall risk    Patient at Risk for Falls Due to No Fall Risks   Impaired mobility;History of fall(s) No Fall Risks  Fall risk Follow up Falls evaluation completed    Education provided    Functional Status Survey: Is the patient deaf or have difficulty hearing?: No Does the patient have difficulty seeing, even when wearing glasses/contacts?: No Does the patient have difficulty concentrating, remembering, or making decisions?: No Does the patient have difficulty walking or climbing stairs?: No Does the patient have difficulty dressing or bathing?: No Does the patient have difficulty doing errands alone such as visiting a doctor's office or shopping?: No    Past Medical History:  Past Medical History:  Diagnosis Date   Asthma    Cancer ([place])    lung   COPD (chronic obstructive pulmonary disease) ([place])    Severe by PFTs 2017   Shortness of breath dyspnea    Squamous cell carcinoma lung, left ([place]) 2017   Radiation therapy, patient was not surgical candidate   Thyroid disease    Weight decrease major recent weight loss    Surgical History:  Past Surgical History:  Procedure Laterality Date   BREAST BIOPSY Right    COLONOSCOPY     ELECTROMAGNETIC NAVIGATION BROCHOSCOPY N/A 01/02/2016   Procedure: ELECTROMAGNETIC NAVIGATION BRONCHOSCOPY;  Surgeon: Flora Lipps, MD;  Location: [facility] ORS;  Service: Cardiopulmonary;  Laterality: N/A;   ESOPHAGOGASTRODUODENOSCOPY (EGD) WITH PROPOFOL N/A 08/27/2018   Procedure: ESOPHAGOGASTRODUODENOSCOPY (EGD) WITH PROPOFOL;  Surgeon: Lucilla Lame, MD;  Location: [facility];  Service: Endoscopy;  Laterality: N/A;   INTRAMEDULLARY (IM) NAIL INTERTROCHANTERIC Right 11/03/2017   Procedure: INTRAMEDULLARY (IM) NAIL INTERTROCHANTRIC;  Surgeon: Leim Fabry, MD;  Location: [facility] ORS;  Service: Orthopedics;  Laterality: Right;    Medications:  Current Outpatient Medications on File Prior to Visit  Medication Sig   acetaminophen (TYLENOL) 500 MG tablet Take 500-1,000 mg by mouth every 8 (eight) hours as needed for mild constipation or moderate pain.   aspirin 81 MG EC tablet Take 1 tablet by mouth daily.   cholecalciferol (VITAMIN D3) 25 MCG (1000 UNIT) tablet Take 1,000 Units by mouth daily.   dronabinol (MARINOL) 2.5 MG capsule TAKE ONE CAPSULE TWICE A DAY BEFORE MEALS   Fluticasone-Umeclidin-Vilant (TRELEGY ELLIPTA) 100-62.5-25 MCG/ACT AEPB  Inhale 1 puff into the lungs daily.   Iron-Vitamin C 65-125 MG TABS Take 1 tablet by mouth in the morning and at bedtime.   levothyroxine (SYNTHROID) 25 MCG tablet TAKE 1 TABLET EVERY DAY ON EMPTY STOMACHWITH A GLASS OF WATER AT LEAST 30-60 MINBEFORE BREAKFAST   mirtazapine (REMERON) 15 MG tablet Take 1 tablet (15 mg total) by mouth at bedtime.   Multiple Vitamin (MULTIVITAMIN WITH MINERALS) TABS tablet Take 1 tablet by mouth daily.   omeprazole (PRILOSEC) 20 MG capsule TAKE 1 CAPSULE BY MOUTH ONCE DAILY   rosuvastatin (CRESTOR) 10 MG tablet TAKE 1 TABLET BY MOUTH DAILY.   VENTOLIN HFA 108 (90 Base) MCG/ACT inhaler INHALE TWO PUFFS EVERY SIX HOURS AS NEEDED FOR WHEEZING OR SHORTNESS OF BREATH   No current facility-administered medications on file prior to visit.    Allergies:  Allergies  Allergen Reactions   Levaquin [Levofloxacin] Nausea Only    Cramps & Body aches    Social History:  Social History   Socioeconomic History   Marital status: Widowed    Spouse name: Not on file   Number of children: Not on file   Years of education: Not on file   Highest education level: Not on file  Occupational History   Occupation: disability  Tobacco Use   Smoking status: Former    Packs/day: 2.00    Years: 50.00    Total pack years: 100.00    Types: Cigarettes    Quit date: 04/03/2022    Years  since quitting: 0.4   Smokeless tobacco: Never   Tobacco comments:    0.5PPD 04/03/2022    Quit on 04/03/2022  Vaping Use   Vaping Use: Former   Substances: Nicotine  Substance and Sexual Activity   Alcohol use: Not Currently    Alcohol/week: 0.0 standard drinks of alcohol   Drug use: No   Sexual activity: Not Currently  Other Topics Concern   Not on file  Social History Narrative   Not on file   Social Determinants of Health   Financial Resource Strain: Low Risk  (09/20/2022)   Overall Financial Resource Strain (CARDIA)    Difficulty of Paying Living Expenses: Not hard at all  Food Insecurity: No Food Insecurity (09/20/2022)   Hunger Vital Sign    Worried About Running Out of Food in the Last Year: Never true    Ran Out of Food in the Last Year: Never true  Transportation Needs: No Transportation Needs (09/20/2022)   PRAPARE - Administrator, Civil Service (Medical): No    Lack of Transportation (Non-Medical): No  Physical Activity: Insufficiently Active (09/20/2022)   Exercise Vital Sign    Days of Exercise per Week: 2 days    Minutes of Exercise per Session: 20 min  Stress: No Stress Concern Present (09/20/2022)   [employer] of Occupational Health - Occupational Stress Questionnaire    Feeling of Stress : Not at all  Social Connections: Moderately Isolated (09/20/2022)   Social Connection and Isolation Panel [NHANES]    Frequency of Communication with Friends and Family: More than three times a week    Frequency of Social Gatherings with Friends and Family: More than three times a week    Attends Religious Services: More than 4 times per year    Active Member of [employer] or Organizations: No    Attends Banker Meetings: Never    Marital Status: Widowed  Intimate Partner Violence: Not At Risk (09/20/2022)  Humiliation, Afraid, Rape, and Kick questionnaire    Fear of Current or Ex-Partner: No    Emotionally Abused: No    Physically Abused: No     Sexually Abused: No   Social History   Tobacco Use  Smoking Status Former   Packs/day: 2.00   Years: 50.00   Total pack years: 100.00   Types: Cigarettes   Quit date: 04/03/2022   Years since quitting: 0.4  Smokeless Tobacco Never  Tobacco Comments   0.5PPD 04/03/2022   Quit on 04/03/2022   Social History   Substance and Sexual Activity  Alcohol Use Not Currently   Alcohol/week: 0.0 standard drinks of alcohol    Family History:  Family History  Problem Relation Age of Onset   Cirrhosis Father    Lung cancer Sister     Past medical history, surgical history, medications, allergies, family history and social history reviewed with patient today and changes made to appropriate areas of the chart.   ROS All other ROS negative except what is listed above and in the HPI.      Objective:    BP 104/64   Pulse 78   Temp 98.2 F (36.8 C) (Oral)   Ht 4' 11.02" (1.499 m)   Wt 114 lb 6.4 oz (51.9 kg)   SpO2 93%   BMI 23.09 kg/m   Wt Readings from Last 3 Encounters:  09/20/22 114 lb 6.4 oz (51.9 kg)  06/24/22 106 lb 9.6 oz (48.4 kg)  06/07/22 105 lb 6.4 oz (47.8 kg)    Physical Exam Vitals and nursing note reviewed. Exam conducted with a chaperone present.  Constitutional:      General: She is awake. She is not in acute distress.    Appearance: She is well-developed, well-groomed and underweight. She is not ill-appearing or toxic-appearing.  HENT:     Head: Normocephalic and atraumatic.     Right Ear: Hearing, tympanic membrane, ear canal and external ear normal. No drainage.     Left Ear: Hearing, tympanic membrane, ear canal and external ear normal. No drainage.     Nose: Nose normal.     Right Sinus: No maxillary sinus tenderness or frontal sinus tenderness.     Left Sinus: No maxillary sinus tenderness or frontal sinus tenderness.     Mouth/Throat:     Mouth: Mucous membranes are moist.     Pharynx: Oropharynx is clear. Uvula midline. No pharyngeal swelling,  oropharyngeal exudate or posterior oropharyngeal erythema.  Eyes:     General: Lids are normal.        Right eye: No discharge.        Left eye: No discharge.     Extraocular Movements: Extraocular movements intact.     Conjunctiva/sclera: Conjunctivae normal.     Pupils: Pupils are equal, round, and reactive to light.     Visual Fields: Right eye visual fields normal and left eye visual fields normal.  Neck:     Thyroid: No thyromegaly.     Vascular: No carotid bruit.     Trachea: Trachea normal.  Cardiovascular:     Rate and Rhythm: Normal rate and regular rhythm.     Heart sounds: Normal heart sounds. No murmur heard.    No gallop.  Pulmonary:     Effort: Pulmonary effort is normal. No accessory muscle usage or respiratory distress.     Breath sounds: Normal breath sounds.  Chest:  Breasts:    Right: Normal.     Left: Normal.  Abdominal:     General: Bowel sounds are normal.     Palpations: Abdomen is soft. There is no hepatomegaly or splenomegaly.     Tenderness: There is no abdominal tenderness.  Musculoskeletal:        General: Normal range of motion.     Cervical back: Normal range of motion and neck supple.     Right lower leg: No edema.     Left lower leg: No edema.  Lymphadenopathy:     Head:     Right side of head: No submental, submandibular, tonsillar, preauricular or posterior auricular adenopathy.     Left side of head: No submental, submandibular, tonsillar, preauricular or posterior auricular adenopathy.     Cervical: No cervical adenopathy.     Upper Body:     Right upper body: No supraclavicular, axillary or pectoral adenopathy.     Left upper body: No supraclavicular, axillary or pectoral adenopathy.  Skin:    General: Skin is warm and dry.     Capillary Refill: Capillary refill takes less than 2 seconds.     Findings: No rash.  Neurological:     Mental Status: She is alert and oriented to person, place, and time.     Gait: Gait is intact.     Deep  Tendon Reflexes: Reflexes are normal and symmetric.     Reflex Scores:      Brachioradialis reflexes are 2+ on the right side and 2+ on the left side.      Patellar reflexes are 2+ on the right side and 2+ on the left side. Psychiatric:        Attention and Perception: Attention normal.        Mood and Affect: Mood normal.        Speech: Speech normal.        Behavior: Behavior normal. Behavior is cooperative.        Thought Content: Thought content normal.        Judgment: Judgment normal.       09/20/2022    1:50 PM 09/20/2022    1:17 PM 08/04/2020    9:53 AM  6CIT Screen  What Year? 0 points 0 points 0 points  What month? 0 points 0 points 0 points  What time? 0 points 0 points 0 points  Count back from 20 0 points 0 points 0 points  Months in reverse 0 points 0 points 4 points  Repeat phrase 0 points 0 points 6 points  Total Score 0 points 0 points 10 points   Results for orders placed or performed in visit on 05/14/22  Rad Onc [facility] Session Summary  Result Value Ref Range   Course ID [id number]    Course Intent Unknown    Course Start Date 04/23/2022  2:59 PM    Session Number 5    Course First Treatment Date 04/30/2022  9:37 AM    Course Last Treatment Date 05/14/2022  9:40 AM    Course Elapsed Days 14    Reference Point ID Lung_SBRT DP    Reference Point Dosage Given to Date 60 Gy   Reference Point Session Dosage Given 12 Gy   Plan ID Lung_R_SBRT    Plan Fractions Treated to Date 5    Plan Total Fractions Prescribed 5    Plan Prescribed Dose Per Fraction 12 Gy   Plan Total Prescribed Dose 60.000000 Gy   Plan Primary Reference Point Lung_SBRT DP       Assessment &  Plan:   Problem List Items Addressed This Visit       Cardiovascular and Mediastinum   Thoracic aortic atherosclerosis (HCC)    Chronic, stable.  Noted on past imaging in 2017.  Recommend complete cessation of smoking.  Continue statin at low dose daily and Baby ASA for preventative care.         Respiratory   COPD with emphysema (HCC) - Primary    Chronic, ongoing.  Followed by pulmonary, continue this collaboration.  She wishes to return to [facility] use and stop Trelegy, felt more benefit from [facility] -- she will discuss with pulmonary at visit today.  Praised for quitting smoking.      Right lower lobe pulmonary nodule    Ongoing, monitored by pulmonary and oncology, continue this collaboration.        Digestive   GERD without esophagitis    Chronic, ongoing stable with Omeprazole.  Has tried reduction in past with return of symptoms. Continue current medication regimen and adjust as needed.  Mag level annually. Risks of PPI use were discussed with patient including bone loss, C. Diff diarrhea, pneumonia, infections, CKD, electrolyte abnormalities.  Verbalizes understanding and chooses to continue the medication.       Relevant Orders   Magnesium     Endocrine   Hypothyroidism    Chronic, ongoing.  Will continue current regimen at this time and adjust as needed based on labs.  TSH and Free T4 today.      Relevant Orders   TSH   T4, free     Nervous and Auditory   Nicotine dependence, cigarettes, w unsp disorders    Quit smoking 6 months ago, recommend continued cessation and praised for this.         Other   History of lung cancer    Followed by pulmonary and oncology, continue this collaboration.  Recent notes reviewed.      Iron deficiency anemia secondary to inadequate dietary iron intake    Ongoing, stable, continues on Vitron with benefit.  Will continue this regimen and obtain labs today to include CBC + iron check.      Relevant Orders   CBC with Differential/Platelet   Iron Binding Cap (TIBC)([facility])   Ferritin   Medication monitoring encounter    Takes Dronabinol for appetite stimulant due to past lung CA and weight loss, continue regimen.  Obtain UDS today.      Relevant Orders   [id number] 11+Oxyco+Alc+Crt-Bund   Mixed hyperlipidemia     Chronic, ongoing.  Continue current medication regimen and adjust as needed.  Lipid panel today.      Relevant Orders   Comprehensive metabolic panel   Lipid Panel w/o Chol/HDL Ratio   Moderate protein-calorie malnutrition (HCC)    Ongoing issue, post lung CA.  Is gaining at this time.  Will continue Mirtazapine and Dronabinol, as currently prescribed.  This is offering benefit to her appetite and maintaining weight.  She is aware Dronabinol is a controlled substance and will require yearly drug screen and controlled substance contract, which she is agreeable to. UDS which is due today and obtain contract next visit.      Other Visit Diagnoses     Medicare annual wellness visit, subsequent       Due for Medicare Wellness, performed with patient today.   Vitamin B12 deficiency       Relevant Orders   Vitamin B12   Vitamin D deficiency       Relevant  Orders   VITAMIN D 25 Hydroxy (Vit-D Deficiency, Fractures)   Encounter for screening mammogram for malignant neoplasm of breast       Mammogram ordered.   Relevant Orders   MM 3D SCREEN BREAST BILATERAL   Open wound       Td in office today.   Relevant Orders   Td : Tetanus/diphtheria >7yo Preservative  free (Completed)   Need for Td vaccine       Relevant Orders   Td : Tetanus/diphtheria >7yo Preservative  free (Completed)        Follow up plan: Return in about 6 months (around 03/21/2023) for HLD, COPD, MOOD, THYROID.   LABORATORY TESTING:  - Pap smear:  refused  IMMUNIZATIONS:   - Tdap: Tetanus vaccination status reviewed: Td vaccination indicated and given today. - Influenza: Up to date - Pneumovax: Up to date - Prevnar: Not applicable - COVID: Up to date - HPV: Not applicable - Shingrix vaccine: Refused  SCREENING: -Mammogram: Ordered Today - Colonoscopy: Up to date  - Bone Density: Not applicable  -Hearing Test: Not applicable  -Spirometry: Up to date   PATIENT COUNSELING:   Advised to take 1 mg of folate  supplement per day if capable of pregnancy.   Sexuality: Discussed sexually transmitted diseases, partner selection, use of condoms, avoidance of unintended pregnancy  and contraceptive alternatives.   Advised to avoid cigarette smoking.  I discussed with the patient that most people either abstain from alcohol or drink within safe limits (<=14/week and <=4 drinks/occasion for males, <=7/weeks and <= 3 drinks/occasion for females) and that the risk for alcohol disorders and other health effects rises proportionally with the number of drinks per week and how often a drinker exceeds daily limits.  Discussed cessation/primary prevention of drug use and availability of treatment for abuse.   Diet: Encouraged to adjust caloric intake to maintain  or achieve ideal body weight, to reduce intake of dietary saturated fat and total fat, to limit sodium intake by avoiding high sodium foods and not adding table salt, and to maintain adequate dietary potassium and calcium preferably from fresh fruits, vegetables, and low-fat dairy products.    Stressed the importance of regular exercise  Injury prevention: Discussed safety belts, safety helmets, smoke detector, smoking near bedding or upholstery.   Dental health: Discussed importance of regular tooth brushing, flossing, and dental visits.    NEXT PREVENTATIVE PHYSICAL DUE IN 1 YEAR. Return in about 6 months (around 03/21/2023) for HLD, COPD, MOOD, THYROID.

## 2022-09-20 NOTE — Addendum Note (Signed)
Addended by: Aura Dials T on: 09/20/2022 02:38 PM   Modules accepted: Orders

## 2022-09-20 NOTE — Assessment & Plan Note (Signed)
Ongoing, stable, continues on Vitron with benefit.  Will continue this regimen and obtain labs today to include CBC + iron check.

## 2022-09-20 NOTE — Assessment & Plan Note (Signed)
Chronic, ongoing.  Will continue current regimen at this time and adjust as needed based on labs.  TSH and Free T4 today.

## 2022-09-20 NOTE — Assessment & Plan Note (Signed)
Takes Dronabinol for appetite stimulant due to past lung CA and weight loss, continue regimen.  Obtain UDS today.

## 2022-09-20 NOTE — Assessment & Plan Note (Signed)
Quit smoking 6 months ago, recommend continued cessation and praised for this.

## 2022-09-21 LAB — IRON AND TIBC
Iron Saturation: 20 % (ref 15–55)
Iron: 49 ug/dL (ref 27–139)
Total Iron Binding Capacity: 244 ug/dL — ABNORMAL LOW (ref 250–450)
UIBC: 195 ug/dL (ref 118–369)

## 2022-09-21 LAB — COMPREHENSIVE METABOLIC PANEL
ALT: 13 IU/L (ref 0–32)
AST: 20 IU/L (ref 0–40)
Albumin/Globulin Ratio: 1.8 (ref 1.2–2.2)
Albumin: 4.3 g/dL (ref 3.9–4.9)
Alkaline Phosphatase: 123 IU/L — ABNORMAL HIGH (ref 44–121)
BUN/Creatinine Ratio: 13 (ref 12–28)
BUN: 9 mg/dL (ref 8–27)
Bilirubin Total: <0.2 mg/dL (ref 0.0–1.2)
CO2: 24 mmol/L (ref 20–29)
Calcium: 8.9 mg/dL (ref 8.7–10.3)
Chloride: 104 mmol/L (ref 96–106)
Creatinine, Ser: 0.7 mg/dL (ref 0.57–1.00)
Globulin, Total: 2.4 g/dL (ref 1.5–4.5)
Glucose: 101 mg/dL — ABNORMAL HIGH (ref 70–99)
Potassium: 4.2 mmol/L (ref 3.5–5.2)
Sodium: 144 mmol/L (ref 134–144)
Total Protein: 6.7 g/dL (ref 6.0–8.5)
eGFR: 98 mL/min/{1.73_m2} (ref >59)

## 2022-09-21 LAB — DRUG SCREEN 764883 11+OXYCO+ALC+CRT-BUND

## 2022-09-21 LAB — CBC WITH DIFFERENTIAL/PLATELET
Basophils Absolute: 0.1 10*3/uL (ref 0.0–0.2)
Basos: 1 %
EOS (ABSOLUTE): 0.1 10*3/uL (ref 0.0–0.4)
Eos: 2 %
Hematocrit: 35.8 % (ref 34.0–46.6)
Hemoglobin: 12.1 g/dL (ref 11.1–15.9)
Immature Grans (Abs): 0 10*3/uL (ref 0.0–0.1)
Immature Granulocytes: 0 %
Lymphocytes Absolute: 1.8 10*3/uL (ref 0.7–3.1)
Lymphs: 27 %
MCH: 31.5 pg (ref 26.6–33.0)
MCHC: 33.8 g/dL (ref 31.5–35.7)
MCV: 93 fL (ref 79–97)
Monocytes Absolute: 0.4 10*3/uL (ref 0.1–0.9)
Monocytes: 6 %
Neutrophils Absolute: 4.3 10*3/uL (ref 1.4–7.0)
Neutrophils: 64 %
Platelets: 247 10*3/uL (ref 150–450)
RBC: 3.84 x10E6/uL (ref 3.77–5.28)
RDW: 11.3 % — ABNORMAL LOW (ref 11.7–15.4)
WBC: 6.7 10*3/uL (ref 3.4–10.8)

## 2022-09-21 LAB — VITAMIN B12: Vitamin B-12: 1155 pg/mL (ref 232–1245)

## 2022-09-21 LAB — LIPID PANEL W/O CHOL/HDL RATIO
Cholesterol, Total: 174 mg/dL (ref 100–199)
HDL: 46 mg/dL (ref >39)
LDL Chol Calc (NIH): 79 mg/dL (ref 0–99)
Triglycerides: 303 mg/dL — ABNORMAL HIGH (ref 0–149)
VLDL Cholesterol Cal: 49 mg/dL — ABNORMAL HIGH (ref 5–40)

## 2022-09-21 LAB — TSH: TSH: 1.43 u[IU]/mL (ref 0.450–4.500)

## 2022-09-21 LAB — FERRITIN: Ferritin: 649 ng/mL — ABNORMAL HIGH (ref 15–150)

## 2022-09-21 LAB — T4, FREE: Free T4: 0.71 ng/dL — ABNORMAL LOW (ref 0.82–1.77)

## 2022-09-21 LAB — VITAMIN D 25 HYDROXY (VIT D DEFICIENCY, FRACTURES): Vit D, 25-Hydroxy: 33.8 ng/mL (ref 30.0–100.0)

## 2022-09-21 LAB — MAGNESIUM: Magnesium: 2.1 mg/dL (ref 1.6–2.3)

## 2022-09-21 NOTE — Progress Notes (Signed)
Good morning, please let Lori Rangel know her labs have returned: - CBC remains stable, however iron level had trended down some to lower end of normal.  Please make sure to order your iron supplement and take this daily as previously did. - Kidney function, creatinine and eGFR, remains normal, as is liver function, AST and ALT.  - Cholesterol labs show stable LDL, but triglycerides a little elevated.  Please ensure to continue your Rosuvastatin daily and we may increase this dose next visit. - Thyroid lab shows normal TSH, Free T4 a little low - for now continue current Levothyroxine dosing. - Remainder of labs all stable.  Continue all medications and supplements.  Any questions? Keep being amazing!!  Thank you for allowing me to participate in your care.  I appreciate you. Kindest regards, Minh Roanhorse

## 2022-09-24 ENCOUNTER — Ambulatory Visit
Admission: RE | Admit: 2022-09-24 | Discharge: 2022-09-24 | Disposition: A | Discharge status: Home / Self Care | Payer: Medicare Other | Source: Ambulatory Visit | Attending: Radiation Oncology | Admitting: Radiation Oncology

## 2022-09-24 DIAGNOSIS — R911 Solitary pulmonary nodule: Secondary | ICD-10-CM | POA: Insufficient documentation

## 2022-09-24 LAB — DRUG SCREEN 764883 11+OXYCO+ALC+CRT-BUND
Amphetamines, Urine: NEGATIVE ng/mL
BENZODIAZ UR QL: NEGATIVE ng/mL
Barbiturate: NEGATIVE ng/mL
Cocaine (Metabolite): NEGATIVE ng/mL
Creatinine: 171.4 mg/dL (ref 20.0–300.0)
Ethanol: NEGATIVE %
Meperidine: NEGATIVE ng/mL
Methadone Screen, Urine: NEGATIVE ng/mL
OPIATE SCREEN URINE: NEGATIVE ng/mL
Oxycodone/Oxymorphone, Urine: NEGATIVE ng/mL
Phencyclidine: NEGATIVE ng/mL
Propoxyphene: NEGATIVE ng/mL
Tramadol: NEGATIVE ng/mL
pH, Urine: 5.9 (ref 4.5–8.9)

## 2022-09-24 LAB — CANNABINOID CONFIRMATION, UR
CANNABINOIDS: POSITIVE — AB
Carboxy THC GC/MS Conf: 229 ng/mL

## 2022-09-24 MED ORDER — IOHEXOL 300 MG/ML  SOLN
75.0000 mL | Freq: Once | INTRAMUSCULAR | Status: AC | PRN
Start: 2022-09-24 — End: 2022-09-24
  Administered 2022-09-24: 75 mL via INTRAVENOUS

## 2022-09-30 ENCOUNTER — Ambulatory Visit
Admission: RE | Admit: 2022-09-30 | Discharge: 2022-09-30 | Disposition: A | Discharge status: Home / Self Care | Payer: Medicare Other | Source: Ambulatory Visit | Attending: Radiation Oncology | Admitting: Radiation Oncology

## 2022-09-30 ENCOUNTER — Encounter: Payer: Self-pay | Admitting: Radiation Oncology

## 2022-09-30 VITALS — BP 122/72 | HR 84 | Temp 97.0°F | Resp 16 | Ht 59.0 in | Wt 114.2 lb

## 2022-09-30 DIAGNOSIS — Z923 Personal history of irradiation: Secondary | ICD-10-CM | POA: Diagnosis not present

## 2022-09-30 DIAGNOSIS — C3431 Malignant neoplasm of lower lobe, right bronchus or lung: Secondary | ICD-10-CM | POA: Diagnosis present

## 2022-09-30 DIAGNOSIS — Z85118 Personal history of other malignant neoplasm of bronchus and lung: Secondary | ICD-10-CM | POA: Diagnosis not present

## 2022-09-30 DIAGNOSIS — R911 Solitary pulmonary nodule: Secondary | ICD-10-CM

## 2022-09-30 NOTE — Progress Notes (Signed)
Radiation Oncology Follow up Note  Name: Lori Rangel   Date:   09/30/2022 MRN:  [id number] DOB: [birth year]    This 62 y.o. 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  REFERRING PROVIDER: Venita Lick, NP  HPI: Patient is a 62 year old female now out for months having completed SBRT to her right lower lobe for stage I non-small cell lung cancer.  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  Seen today in routine follow-up she is doing well specifically denies cough hemoptysis chest tightness any change in pulmonary status.  She had a recent CT scan.  Showing near complete resolution of the right lower lobe nodule compatible with response to therapy there is some mild fullness to the left axillary lymph node which warrants attention on follow-up.  No other new pulmonary nodules are noted.  COMPLICATIONS OF TREATMENT: none  FOLLOW UP COMPLIANCE: keeps appointments   PHYSICAL EXAM:  BP 122/72   Pulse 84   Temp (!) 97 F (36.1 C)   Resp 16   Ht 4\' 11"  (1.499 m)   Wt 114 lb 3.2 oz (51.8 kg)   BMI 23.07 kg/m  Well-developed well-nourished patient in NAD. HEENT reveals PERLA, EOMI, discs not visualized.  Oral cavity is clear. No oral mucosal lesions are identified. Neck is clear without evidence of cervical or supraclavicular adenopathy. Lungs are clear to A&P. Cardiac examination is essentially unremarkable with regular rate and rhythm without murmur rub or thrill. Abdomen is benign with no organomegaly or masses noted. Motor sensory and DTR levels are equal and symmetric in the upper and lower extremities. Cranial nerves II through XII are grossly intact. Proprioception is intact. No peripheral adenopathy or edema is identified. No motor or sensory levels are  noted. Crude visual fields are within normal range.  CT  RADIOLOGY RESULTS: Scans reviewed compatible with above-stated findings  PLAN: Present time patient is doing well excellent response to SBRT treatment.  I have asked to see her back in 6 months and follow-up with a repeat CT scan at that time.  Patient is to call with any concerns at any time.  I would like to take this opportunity to thank you for allowing me to participate in the care of your patient.Noreene Filbert, MD

## 2022-10-08 ENCOUNTER — Ambulatory Visit (INDEPENDENT_AMBULATORY_CARE_PROVIDER_SITE_OTHER): Payer: Medicare Other

## 2022-10-08 VITALS — Ht 59.0 in | Wt 114.0 lb

## 2022-10-08 DIAGNOSIS — Z0000 Encounter for general adult medical examination without abnormal findings: Secondary | ICD-10-CM | POA: Diagnosis not present

## 2022-10-08 NOTE — Progress Notes (Signed)
Virtual Visit via Telephone Note  I connected with  Lori Rangel on 10/08/22 at  2:30 PM EST by telephone and verified that I am speaking with the correct person using two identifiers.  Location: Patient: home Provider: [facility] Persons participating in the virtual visit: patient/Nurse Health Advisor   I discussed the limitations, risks, security and privacy concerns of performing an evaluation and management service by telephone and the availability of in person appointments. The patient expressed understanding and agreed to proceed.  Interactive audio and video telecommunications were attempted between this nurse and patient, however failed, due to patient having technical difficulties OR patient did not have access to video capability.  We continued and completed visit with audio only.  Some vital signs may be absent or patient reported.   Dionisio David, LPN  Subjective:   Lori Rangel is a 62 y.o. female who presents for Medicare Annual (Subsequent) preventive examination.  Review of Systems     Cardiac Risk Factors include: advanced age (>42men, >46 women)     Objective:    There were no vitals filed for this visit. There is no height or weight on file to calculate BMI.     10/08/2022    2:29 PM 09/30/2022   11:39 AM 06/24/2022   11:14 AM 08/16/2021   11:20 AM 08/04/2020    9:49 AM 08/27/2018    7:54 AM 08/25/2018    5:39 PM  Advanced Directives  Does Patient Have a Medical Advance Directive? No Yes  No Yes Yes Yes  Type of Armed forces technical officer of [place];Living will  [employer];Living will [place];Living will [place];Living will  Does patient want to make changes to medical advance directive? No - Patient declined No - Patient declined No - Patient declined    No - Patient declined  Copy of [place] in Chart?  No - copy requested   No - copy  requested No - copy requested No - copy requested  Would patient like information on creating a medical advance directive? No - Patient declined No - Patient declined  No - Patient declined  No - Patient declined No - Patient declined    Current Medications (verified) Outpatient Encounter Medications as of 10/08/2022  Medication Sig   acetaminophen (TYLENOL) 500 MG tablet Take 500-1,000 mg by mouth every 8 (eight) hours as needed for mild constipation or moderate pain.   aspirin 81 MG EC tablet Take 1 tablet by mouth daily.   cholecalciferol (VITAMIN D3) 25 MCG (1000 UNIT) tablet Take 1,000 Units by mouth daily.   [START ON 10/11/2022] dronabinol (MARINOL) 2.5 MG capsule TAKE ONE CAPSULE TWICE A DAY BEFORE MEALS   Fluticasone-Umeclidin-Vilant (TRELEGY ELLIPTA) 100-62.5-25 MCG/ACT AEPB Inhale 1 puff into the lungs daily.   Iron-Vitamin C 65-125 MG TABS Take 1 tablet by mouth in the morning and at bedtime.   levothyroxine (SYNTHROID) 25 MCG tablet TAKE 1 TABLET EVERY DAY ON EMPTY STOMACHWITH A GLASS OF WATER AT LEAST 30-60 MINBEFORE BREAKFAST   mirtazapine (REMERON) 15 MG tablet Take 1 tablet (15 mg total) by mouth at bedtime.   Multiple Vitamin (MULTIVITAMIN WITH MINERALS) TABS tablet Take 1 tablet by mouth daily.   omeprazole (PRILOSEC) 20 MG capsule Take 1 capsule (20 mg total) by mouth daily.   rosuvastatin (CRESTOR) 10 MG tablet Take 1 tablet (10 mg total) by mouth daily.   VENTOLIN HFA 108 (  90 Base) MCG/ACT inhaler INHALE TWO PUFFS EVERY SIX HOURS AS NEEDED FOR WHEEZING OR SHORTNESS OF BREATH   No facility-administered encounter medications on file as of 10/08/2022.    Allergies (verified) Levaquin [levofloxacin]   History: Past Medical History:  Diagnosis Date   Asthma    Cancer ([place])    lung   COPD (chronic obstructive pulmonary disease) ([place])    Severe by PFTs 2017   Shortness of breath dyspnea    Squamous cell carcinoma lung, left ([place]) 2017   Radiation therapy, patient was not  surgical candidate   Thyroid disease    Weight decrease major recent weight loss   Past Surgical History:  Procedure Laterality Date   BREAST BIOPSY Right    COLONOSCOPY     ELECTROMAGNETIC NAVIGATION BROCHOSCOPY N/A 01/02/2016   Procedure: ELECTROMAGNETIC NAVIGATION BRONCHOSCOPY;  Surgeon: Flora Lipps, MD;  Location: [facility] ORS;  Service: Cardiopulmonary;  Laterality: N/A;   ESOPHAGOGASTRODUODENOSCOPY (EGD) WITH PROPOFOL N/A 08/27/2018   Procedure: ESOPHAGOGASTRODUODENOSCOPY (EGD) WITH PROPOFOL;  Surgeon: Lucilla Lame, MD;  Location: [facility] ENDOSCOPY;  Service: Endoscopy;  Laterality: N/A;   INTRAMEDULLARY (IM) NAIL INTERTROCHANTERIC Right 11/03/2017   Procedure: INTRAMEDULLARY (IM) NAIL INTERTROCHANTRIC;  Surgeon: Leim Fabry, MD;  Location: [facility] ORS;  Service: Orthopedics;  Laterality: Right;   Family History  Problem Relation Age of Onset   Cirrhosis Father    Lung cancer Sister    Social History   Socioeconomic History   Marital status: Widowed    Spouse name: Not on file   Number of children: Not on file   Years of education: Not on file   Highest education level: Not on file  Occupational History   Occupation: disability  Tobacco Use   Smoking status: Former    Packs/day: 2.00    Years: 50.00    Total pack years: 100.00    Types: Cigarettes    Quit date: 04/03/2022    Years since quitting: 0.5   Smokeless tobacco: Never   Tobacco comments:    0.5PPD 04/03/2022    Quit on 04/03/2022  Vaping Use   Vaping Use: Former   Substances: Nicotine  Substance and Sexual Activity   Alcohol use: Not Currently    Alcohol/week: 0.0 standard drinks of alcohol   Drug use: No   Sexual activity: Not Currently  Other Topics Concern   Not on file  Social History Narrative   Not on file   Social Determinants of Health   Financial Resource Strain: Low Risk  (10/08/2022)   Overall Financial Resource Strain (CARDIA)    Difficulty of Paying Living Expenses: Not hard at all  Food Insecurity:  No Food Insecurity (10/08/2022)   Hunger Vital Sign    Worried About Running Out of Food in the Last Year: Never true    Felt in the Last Year: Never true  Transportation Needs: No Transportation Needs (10/08/2022)   PRAPARE - Hydrologist (Medical): No    Lack of Transportation (Non-Medical): No  Physical Activity: Insufficiently Active (10/08/2022)   Exercise Vital Sign    Days of Exercise per Week: 2 days    Minutes of Exercise per Session: 20 min  Stress: No Stress Concern Present (10/08/2022)   [place]    Feeling of Stress : Not at all  Social Connections: Socially Isolated (10/08/2022)   Social Connection and Isolation Panel [NHANES]    Frequency of Communication with Friends  and Family: More than three times a week    Frequency of Social Gatherings with Friends and Family: Once a week    Attends Religious Services: Never    Marine scientist or Organizations: No    Attends Archivist Meetings: Never    Marital Status: Widowed    Tobacco Counseling Counseling given: Not Answered Tobacco comments: 0.5PPD 04/03/2022 Quit on 04/03/2022   Clinical Intake:  Pre-visit preparation completed: Yes  Pain : No/denies pain     Nutritional Risks: None Diabetes: No  How often do you need to have someone help you when you read instructions, pamphlets, or other written materials from your doctor or pharmacy?: 1 - Never  Diabetic?no  Interpreter Needed?: No  Information entered by :: Kirke Shaggy, LPN   Activities of Daily Living    10/08/2022    2:30 PM 09/20/2022    1:48 PM  In your present state of health, do you have any difficulty performing the following activities:  Hearing? 0 0  Vision? 0 0  Difficulty concentrating or making decisions? 0 0  Walking or climbing stairs? 1 0  Dressing or bathing? 0 0  Doing errands, shopping? 0 0  Preparing Food and  eating ? N   Using the Toilet? N   In the past six months, have you accidently leaked urine? N   Do you have problems with loss of bowel control? N   Managing your Medications? N   Managing your Finances? N   Housekeeping or managing your Housekeeping? N     Patient Care Team: Venita Lick, NP as PCP - General (Nurse Practitioner) Minna Merritts, MD as Consulting Physician (Cardiology) Tyler Pita, MD as Consulting Physician (Pulmonary Disease) Noreene Filbert, MD as Consulting Physician (Radiation Oncology)  Indicate any recent Medical Services you may have received from other than [facility] providers in the past year (date may be approximate).     Assessment:   This is a routine wellness examination for College.  Hearing/Vision screen Hearing Screening - Comments:: No aids Vision Screening - Comments:: No glasses  Dietary issues and exercise activities discussed: Current Exercise Habits: Home exercise routine, Type of exercise: walking, Time (Minutes): 20, Frequency (Times/Week): 2, Weekly Exercise (Minutes/Week): 40, Intensity: Mild   Goals Addressed             This Visit's Progress    DIET - EAT MORE FRUITS AND VEGETABLES         Depression Screen    10/08/2022    2:27 PM 09/20/2022    1:28 PM 03/19/2022    3:07 PM 08/16/2021   11:37 AM 09/08/2020    2:20 PM 08/04/2020    9:51 AM 07/12/2020   10:19 AM  PHQ 2/9 Scores  PHQ - 2 Score 0 0 0 0 0 0 0  PHQ- 9 Score 0 0 0        Fall Risk    10/08/2022    2:30 PM 09/20/2022    1:48 PM 09/20/2022    1:27 PM 03/19/2022    3:07 PM 08/16/2021   11:21 AM  Fall Risk   Falls in the past year? 0 0 0 0 0  Number falls in past yr: 0 0 0 0 0  Injury with Fall? 0 0 0 0 0  Risk for fall due to : No Fall Risks No Fall Risks Impaired mobility;History of fall(s) No Fall Risks   Follow up Falls prevention discussed;Falls evaluation completed  Education provided  Falls evaluation completed Falls evaluation completed;Falls  prevention discussed    FALL RISK PREVENTION PERTAINING TO THE HOME:  Any stairs in or around the home? No  If so, are there any without handrails? No  Home free of loose throw rugs in walkways, pet beds, electrical cords, etc? Yes  Adequate lighting in your home to reduce risk of falls? Yes   ASSISTIVE DEVICES UTILIZED TO PREVENT FALLS:  Life alert? Yes  Use of a cane, walker or w/c? Yes  Grab bars in the bathroom? No  Shower chair or bench in shower? Yes  Elevated toilet seat or a handicapped toilet? No   Cognitive Function:        10/08/2022    2:35 PM 09/20/2022    1:50 PM 09/20/2022    1:17 PM 08/04/2020    9:53 AM  6CIT Screen  What Year? 0 points 0 points 0 points 0 points  What month? 0 points 0 points 0 points 0 points  What time? 0 points 0 points 0 points 0 points  Count back from 20 0 points 0 points 0 points 0 points  Months in reverse 0 points 0 points 0 points 4 points  Repeat phrase 0 points 0 points 0 points 6 points  Total Score 0 points 0 points 0 points 10 points    Immunizations Immunization History  Administered Date(s) Administered   Influenza Inj Mdck Quad Pf 06/03/2018   Influenza Split 07/04/2015   Influenza,inj,Quad PF,6+ Mos 06/03/2018, 06/15/2019   Influenza-Unspecified 06/06/2016, 06/21/2020, 07/18/2021, 08/22/2022   Moderna Sars-Covid-2 Vaccination 05/27/2020, 06/24/2020   Pfizer Covid-19 Vaccine Bivalent Booster 71yrs & up 12/23/2020   Pneumococcal Polysaccharide-23 09/08/2020   Td 09/20/2022    TDAP status: Up to date  Flu Vaccine status: Up to date  Pneumococcal vaccine status: Up to date  Covid-19 vaccine status: Completed vaccines  Qualifies for Shingles Vaccine? Yes   Zostavax completed No   Shingrix Completed?: No.    Education has been provided regarding the importance of this vaccine. Patient has been advised to call insurance company to determine out of pocket expense if they have not yet received this vaccine. Advised  may also receive vaccine at local pharmacy or Health Dept. Verbalized acceptance and understanding.  Screening Tests Health Maintenance  Topic Date Due   COVID-19 Vaccine (4 - 2023-24 season) 05/03/2022   Zoster Vaccines- Shingrix (1 of 2) 12/20/2022 (Originally 10/20/1979)   MAMMOGRAM  09/21/2023 (Originally 10/20/2009)   Lung Cancer Screening  09/25/2023   Medicare Annual Wellness (AWV)  10/09/2023   Fecal DNA (Cologuard)  10/01/2024   DTaP/Tdap/Td (2 - Tdap) 09/20/2032   INFLUENZA VACCINE  Completed   Hepatitis C Screening  Completed   HIV Screening  Completed   HPV VACCINES  Aged Out   PAP SMEAR-Modifier  Discontinued    Health Maintenance  Health Maintenance Due  Topic Date Due   COVID-19 Vaccine (4 - 2023-24 season) 05/03/2022    Colorectal cancer screening: Type of screening: Cologuard. Completed 10/01/21. Repeat every 3 years  Mammogram status: Ordered 09/20/22. Pt provided with contact info and advised to call to schedule appt.    Lung Cancer Screening: (Low Dose CT Chest recommended if Age 65-80 years, 30 pack-year currently smoking OR have quit w/in 15years.) does qualify.   Lung Cancer Screening Referral: scheduled for 03/31/23  Additional Screening:  Hepatitis C Screening: does qualify; Completed 07/12/20  Vision Screening: Recommended annual ophthalmology exams for early detection of glaucoma and  other disorders of the eye. Is the patient up to date with their annual eye exam?  No  Who is the provider or what is the name of the office in which the patient attends annual eye exams? No one If pt is not established with a provider, would they like to be referred to a provider to establish care? No .   Dental Screening: Recommended annual dental exams for proper oral hygiene  Community Resource Referral / Chronic Care Management: CRR required this visit?  No   CCM required this visit?  No      Plan:     I have personally reviewed and noted the following in  the patient's chart:   Medical and social history Use of alcohol, tobacco or illicit drugs  Current medications and supplements including opioid prescriptions. Patient is not currently taking opioid prescriptions. Functional ability and status Nutritional status Physical activity Advanced directives List of other physicians Hospitalizations, surgeries, and ER visits in previous 12 months Vitals Screenings to include cognitive, depression, and falls Referrals and appointments  In addition, I have reviewed and discussed with patient certain preventive protocols, quality metrics, and best practice recommendations. A written personalized care plan for preventive services as well as general preventive health recommendations were provided to patient.     Dionisio David, LPN   [id number]   Nurse Notes: none

## 2022-10-08 NOTE — Patient Instructions (Signed)
[name] , Thank you for taking time to come for your Medicare Wellness Visit. I appreciate your ongoing commitment to your health goals. Please review the following plan we discussed and let me know if I can assist you in the future.   These are the goals we discussed:  Goals      DIET - EAT MORE FRUITS AND VEGETABLES     Patient Stated     08/04/2020, wants to stop needing the walker     Patient Stated     Would like to drink more water        This is a list of the screening recommended for you and due dates:  Health Maintenance  Topic Date Due   COVID-19 Vaccine (4 - 2023-24 season) 05/03/2022   Zoster (Shingles) Vaccine (1 of 2) 12/20/2022*   Mammogram  09/21/2023*   Screening for Lung Cancer  09/25/2023   Medicare Annual Wellness Visit  10/09/2023   Cologuard (Stool DNA test)  10/01/2024   DTaP/Tdap/Td vaccine (2 - Tdap) 09/20/2032   Flu Shot  Completed   Hepatitis C Screening: USPSTF Recommendation to screen - Ages 18-79 yo.  Completed   HIV Screening  Completed   HPV Vaccine  Aged Out   Pap Smear  Discontinued  *Topic was postponed. The date shown is not the original due date.    Advanced directives: no  Conditions/risks identified: none  Next appointment: Follow up in one year for your annual wellness visit 10/14/23 @ 1pm by phone   Preventive Care 65 Years and Older, Female Preventive care refers to lifestyle choices and visits with your health care provider that can promote health and wellness. What does preventive care include? A yearly physical exam. This is also called an annual well check. Dental exams once or twice a year. Routine eye exams. Ask your health care provider how often you should have your eyes checked. Personal lifestyle choices, including: Daily care of your teeth and gums. Regular physical activity. Eating a healthy diet. Avoiding tobacco and drug use. Limiting alcohol use. Practicing safe sex. Taking low-dose aspirin every  day. Taking vitamin and mineral supplements as recommended by your health care provider. What happens during an annual well check? The services and screenings done by your health care provider during your annual well check will depend on your age, overall health, lifestyle risk factors, and family history of disease. Counseling  Your health care provider may ask you questions about your: Alcohol use. Tobacco use. Drug use. Emotional well-being. Home and relationship well-being. Sexual activity. Eating habits. History of falls. Memory and ability to understand (cognition). Work and work Statistician. Reproductive health. Screening  You may have the following tests or measurements: Height, weight, and BMI. Blood pressure. Lipid and cholesterol levels. These may be checked every 5 years, or more frequently if you are over 35 years old. Skin check. Lung cancer screening. You may have this screening every year starting at age 22 if you have a 30-pack-year history of smoking and currently smoke or have quit within the past 15 years. Fecal occult blood test (FOBT) of the stool. You may have this test every year starting at age 74. Flexible sigmoidoscopy or colonoscopy. You may have a sigmoidoscopy every 5 years or a colonoscopy every 10 years starting at age 26. Hepatitis C blood test. Hepatitis B blood test. Sexually transmitted disease (STD) testing. Diabetes screening. This is done by checking your blood sugar (glucose) after you have not eaten for a  while (fasting). You may have this done every 1-3 years. Bone density scan. This is done to screen for osteoporosis. You may have this done starting at age 53. Mammogram. This may be done every 1-2 years. Talk to your health care provider about how often you should have regular mammograms. Talk with your health care provider about your test results, treatment options, and if necessary, the need for more tests. Vaccines  Your health care  provider may recommend certain vaccines, such as: Influenza vaccine. This is recommended every year. Tetanus, diphtheria, and acellular pertussis (Tdap, Td) vaccine. You may need a Td booster every 10 years. Zoster vaccine. You may need this after age 38. Pneumococcal 13-valent conjugate (PCV13) vaccine. One dose is recommended after age 81. Pneumococcal polysaccharide (PPSV23) vaccine. One dose is recommended after age 2. Talk to your health care provider about which screenings and vaccines you need and how often you need them. This information is not intended to replace advice given to you by your health care provider. Make sure you discuss any questions you have with your health care provider. Document Released: 09/15/2015 Document Revised: 05/08/2016 Document Reviewed: 06/20/2015 Elsevier Interactive Patient Education  2017 [place] Prevention in the Home Falls can cause injuries. They can happen to people of all ages. There are many things you can do to make your home safe and to help prevent falls. What can I do on the outside of my home? Regularly fix the edges of walkways and driveways and fix any cracks. Remove anything that might make you trip as you walk through a door, such as a raised step or threshold. Trim any bushes or trees on the path to your home. Use bright outdoor lighting. Clear any walking paths of anything that might make someone trip, such as rocks or tools. Regularly check to see if handrails are loose or broken. Make sure that both sides of any steps have handrails. Any raised decks and porches should have guardrails on the edges. Have any leaves, snow, or ice cleared regularly. Use sand or salt on walking paths during winter. Clean up any spills in your garage right away. This includes oil or grease spills. What can I do in the bathroom? Use night lights. Install grab bars by the toilet and in the tub and shower. Do not use towel bars as grab  bars. Use non-skid mats or decals in the tub or shower. If you need to sit down in the shower, use a plastic, non-slip stool. Keep the floor dry. Clean up any water that spills on the floor as soon as it happens. Remove soap buildup in the tub or shower regularly. Attach bath mats securely with double-sided non-slip rug tape. Do not have throw rugs and other things on the floor that can make you trip. What can I do in the bedroom? Use night lights. Make sure that you have a light by your bed that is easy to reach. Do not use any sheets or blankets that are too big for your bed. They should not hang down onto the floor. Have a firm chair that has side arms. You can use this for support while you get dressed. Do not have throw rugs and other things on the floor that can make you trip. What can I do in the kitchen? Clean up any spills right away. Avoid walking on wet floors. Keep items that you use a lot in easy-to-reach places. If you need to reach something above you,  use a strong step stool that has a grab bar. Keep electrical cords out of the way. Do not use floor polish or wax that makes floors slippery. If you must use wax, use non-skid floor wax. Do not have throw rugs and other things on the floor that can make you trip. What can I do with my stairs? Do not leave any items on the stairs. Make sure that there are handrails on both sides of the stairs and use them. Fix handrails that are broken or loose. Make sure that handrails are as long as the stairways. Check any carpeting to make sure that it is firmly attached to the stairs. Fix any carpet that is loose or worn. Avoid having throw rugs at the top or bottom of the stairs. If you do have throw rugs, attach them to the floor with carpet tape. Make sure that you have a light switch at the top of the stairs and the bottom of the stairs. If you do not have them, ask someone to add them for you. What else can I do to help prevent  falls? Wear shoes that: Do not have high heels. Have rubber bottoms. Are comfortable and fit you well. Are closed at the toe. Do not wear sandals. If you use a stepladder: Make sure that it is fully opened. Do not climb a closed stepladder. Make sure that both sides of the stepladder are locked into place. Ask someone to hold it for you, if possible. Clearly mark and make sure that you can see: Any grab bars or handrails. First and last steps. Where the edge of each step is. Use tools that help you move around (mobility aids) if they are needed. These include: Canes. Walkers. Scooters. Crutches. Turn on the lights when you go into a dark area. Replace any light bulbs as soon as they burn out. Set up your furniture so you have a clear path. Avoid moving your furniture around. If any of your floors are uneven, fix them. If there are any pets around you, be aware of where they are. Review your medicines with your doctor. Some medicines can make you feel dizzy. This can increase your chance of falling. Ask your doctor what other things that you can do to help prevent falls. This information is not intended to replace advice given to you by your health care provider. Make sure you discuss any questions you have with your health care provider. Document Released: 06/15/2009 Document Revised: 01/25/2016 Document Reviewed: 09/23/2014 Elsevier Interactive Patient Education  2017 [employer].

## 2022-11-04 ENCOUNTER — Ambulatory Visit (INDEPENDENT_AMBULATORY_CARE_PROVIDER_SITE_OTHER): Payer: Medicare Other | Admitting: Pulmonary Disease

## 2022-11-04 ENCOUNTER — Encounter: Payer: Self-pay | Admitting: Pulmonary Disease

## 2022-11-04 VITALS — BP 122/72 | HR 75 | Temp 98.0°F | Ht 59.0 in | Wt 113.6 lb

## 2022-11-04 DIAGNOSIS — R911 Solitary pulmonary nodule: Secondary | ICD-10-CM

## 2022-11-04 DIAGNOSIS — J449 Chronic obstructive pulmonary disease, unspecified: Secondary | ICD-10-CM

## 2022-11-04 DIAGNOSIS — Z85118 Personal history of other malignant neoplasm of bronchus and lung: Secondary | ICD-10-CM | POA: Diagnosis not present

## 2022-11-04 MED ORDER — STIOLTO RESPIMAT 2.5-2.5 MCG/ACT IN AERS: 2.0000 | INHALATION_SPRAY | Freq: Every day | RESPIRATORY_TRACT | 0 refills | Status: DC

## 2022-11-04 NOTE — Progress Notes (Signed)
Subjective:    Patient ID: Lori Rangel, female    DOB: [birth year], 62 y.o.   MRN: [id number] Patient Care Team: Venita Lick, NP as PCP - General (Nurse Practitioner) Minna Merritts, MD as Consulting Physician (Cardiology) Tyler Pita, MD as Consulting Physician (Pulmonary Disease) Noreene Filbert, MD as Consulting Physician (Radiation Oncology)  Chief Complaint  Patient presents with   Follow-up    Lung Nodule. No breathing problems.     HPI Patient is a 62 year old former smoker (100 PY) with a history as noted below, who presents for follow-up of a right lung nodule and severe COPD.  Call that in that in 2017 the patient developed a left upper lobe nodule, she underwent electromagnetic navigational bronchoscopy at [facility] by Dr. Mortimer Fries and this was nondiagnostic.  Subsequently she was referred to thoracic surgery at [facility] where she underwent evaluation with percutaneous needle biopsy which resulted in pneumothorax.  She subsequently underwent referral to radiation oncology as she was not an operative candidate.  Per the family's information, the patient did have recurrence after initial radiation and she required a second round of radiation to the left lung.  That left lung has significant volume loss due to postradiation changes.  The patient had been followed up at [facility] with CTs of the chest, last of which was performed on 2 March.  She was to have repeat CT however the patient and the family requested that care be transferred back to the [place] area.  [name] primary care practitioner Marnee Guarneri, NP ordered a CT scan of the chest performed on 28 March 2022, which has shown a nodule on the right lung that is 12 mm in diameter.  The patient then underwent PET/CT on 12 April 2022 which showed FDG activity on the nodule in question.  There was no other activity noted elsewhere.  Because she was not a surgical candidate nor candidate for invasive procedures.  She was  referred to Dr. Noreene Filbert for SBRT and she underwent 5 treatments of SBRT to this nodule.  She has had a follow-up CT chest on 24 September 2022 shows near complete resolution of the nodule in the right lower lobe, stable mild nodularity of the pleura continues with areas of scarring on the left chest.  Mild fullness of a left axillary lymph node.   The patient has not had any hemoptysis.  She has chronic morning cough productive of whitish sputum.  No recent weight loss or anorexia. [name] only other symptoms are those of dyspnea.  She had been doing very well with Anoro however her insurance does not cover this medication and she had to switch to [employer].  She does not feel that this is as helpful and sometimes forgets the second dose of the day.  We then tried her on Trelegy Ellipta during her last visit however she did not like this medication and discontinued it.  She does not endorse any other symptomatology.  No chest pain.  No lower extremity edema and no calf tenderness.   Patient discontinued cigarette smoking in August when she started SBRT.  She has been commended on continued abstinence from tobacco.  She had difficulties with alcohol abuse in the past however she has discontinued drinking and has been abstinent for years.  DATA 04/11/2022 PFTs: FEV1 0.82 L or 35% predicted, FVC 2.01 L or 66% predicted, FEV1/FVC 41%.  There is mild hyperinflation on lung volumes.  Severe diffusion capacity impairment.  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  Review of Systems A 10 point review of systems was performed and it is as noted above otherwise negative.  Patient Active Problem List   Diagnosis Date Noted   GERD without esophagitis 09/19/2021   Mixed hyperlipidemia 03/09/2021   Iron deficiency anemia secondary to inadequate dietary iron intake 07/12/2020   Hypothyroidism 07/12/2020   Medication monitoring encounter 07/12/2020   History of lung cancer 07/07/2020    History of alcohol abuse 10/09/2018   Uses walker 06/03/2018   History of fracture of right hip 11/03/2017   Thoracic aortic atherosclerosis ([place]) 06/07/2016   COPD with emphysema ([place]) 01/25/2016   Carotid stenosis 12/31/2015   Nicotine dependence, cigarettes, w unsp disorders 12/31/2015   Right lower lobe pulmonary nodule 12/31/2015   Moderate protein-calorie malnutrition ([place]) 12/17/2015   Social History   Tobacco Use   Smoking status: Former    Packs/day: 2.00    Years: 50.00    Total pack years: 100.00    Types: Cigarettes    Quit date: 04/03/2022    Years since quitting: 0.5   Smokeless tobacco: Never   Tobacco comments:    0.5PPD 04/03/2022    Quit on 04/03/2022  Substance Use Topics   Alcohol use: Not Currently    Alcohol/week: 0.0 standard drinks of alcohol   Allergies  Allergen Reactions   Levaquin [Levofloxacin] Nausea Only    Cramps & Body aches   Current Meds  Medication Sig   acetaminophen (TYLENOL) 500 MG tablet Take 500-1,000 mg by mouth every 8 (eight) hours as needed for mild constipation or moderate pain.   aspirin 81 MG EC tablet Take 1 tablet by mouth daily.   cholecalciferol (VITAMIN D3) 25 MCG (1000 UNIT) tablet Take 1,000 Units by mouth daily.   Fluticasone-Umeclidin-Vilant (TRELEGY ELLIPTA) 100-62.5-25 MCG/ACT AEPB Inhale 1 puff into the lungs daily.   Iron-Vitamin C 65-125 MG TABS Take 1 tablet by mouth in the morning and at bedtime.   levothyroxine (SYNTHROID) 25 MCG tablet TAKE 1 TABLET EVERY DAY ON EMPTY STOMACHWITH A GLASS OF WATER AT LEAST 30-60 MINBEFORE BREAKFAST   mirtazapine (REMERON) 15 MG tablet Take 1 tablet (15 mg total) by mouth at bedtime.   Multiple Vitamin (MULTIVITAMIN WITH MINERALS) TABS tablet Take 1 tablet by mouth daily.   omeprazole (PRILOSEC) 20 MG capsule Take 1 capsule (20 mg total) by mouth daily.   rosuvastatin (CRESTOR) 10 MG tablet Take 1 tablet (10 mg total) by mouth daily.   VENTOLIN HFA 108 (90 Base) MCG/ACT inhaler  INHALE TWO PUFFS EVERY SIX HOURS AS NEEDED FOR WHEEZING OR SHORTNESS OF BREATH   Immunization History  Administered Date(s) Administered   Influenza Inj Mdck Quad Pf 06/03/2018   Influenza Split 07/04/2015   Influenza,inj,Quad PF,6+ Mos 06/03/2018, 06/15/2019   Influenza-Unspecified 06/06/2016, 06/21/2020, 07/18/2021, 08/22/2022   Moderna Sars-Covid-2 Vaccination 05/27/2020, 06/24/2020   Pfizer Covid-19 Vaccine Bivalent Booster 67yr & up 12/23/2020   Pneumococcal Polysaccharide-23 09/08/2020   Td 09/20/2022       Objective:   Physical Exam BP 122/72 (BP Location: Left Arm, Cuff Size: Normal)   Pulse 75   Temp 98 F (36.7 C)   Ht '4\' 11"'$  (1.499 m)   Wt 113 lb 9.6 oz (51.5 kg)   SpO2 92%   BMI 22.94 kg/m   SpO2: 92 % O2 Device: None (Room air)  GENERAL: Debilitated, frail-appearing woman, looks markedly older than stated age, no acute distress, ambulates with walker. HEAD: Normocephalic,  atraumatic.  EYES: Pupils equal, round, reactive to light.  No scleral icterus.  MOUTH: Poor dentition missing teeth lower, teeth in poor repair upper, pharynx clear. NECK: Supple. No thyromegaly. Trachea midline. No JVD.  No adenopathy. PULMONARY: Distant breath sounds bilaterally.  Faint end expiratory wheezes throughout, no rhonchi. CARDIOVASCULAR: S1 and S2. Regular rate and rhythm.  ABDOMEN: Benign. MUSCULOSKELETAL: No joint deformity, no clubbing, no edema.  NEUROLOGIC: Unsteady gait, requires walker.  No overt localizing finding. SKIN: Intact,warm,dry. PSYCH: Mood and behavior normal.    We reviewed the CT scan of 09/24/2022 independently and also showed the films to patient and her daughter.    Assessment & Plan:     ICD-10-CM   1. Stage 3 severe COPD by GOLD classification ([place])  J44.9    Trial of Stiolto 2 puffs daily Samples provided for the patient She is to let us know if this is effective Continue as needed albuterol    2. Lung nodule  R91.1    Status post  SBRT Almost complete resolution as of CT 09/24/2022    3. History of lung cancer - Squamous cell LUL  Z85.118    This issue adds complexity to her management Significant volume loss on the left due to prior XRT     Meds ordered this encounter  Medications   Tiotropium Bromide-Olodaterol (STIOLTO RESPIMAT) 2.5-2.5 MCG/ACT AERS    Sig: Inhale 2 puffs into the lungs daily.    Dispense:  8 g    Refill:  0    Order Specific Question:   Lot Number?    Answer:   [id number]    Order Specific Question:   Expiration Date?    Answer:   12/31/2024    Order Specific Question:   Quantity    Answer:   2   Overall Ms. Kelp appears to be doing fairly well.  Her COPD could be better controlled.  We will give her a trial of Stiolto as noted above.  He underwent teaching of the Respimat device.  Daughter was present and participated in the instruction as well.  Will see the patient in follow-up in 4 to 6 months time she is to contact us prior to that time should any new difficulties arise.  Renold Don, MD Advanced Bronchoscopy [facility]    *This note was dictated using voice recognition software/Dragon.  Despite best efforts to proofread, errors can occur which can change the meaning. Any transcriptional errors that result from this process are unintentional and may not be fully corrected at the time of dictation.

## 2022-11-04 NOTE — Patient Instructions (Addendum)
We have given you samples of an inhaler called Stiolto this is 2 puffs once a day.  You may continue using albuterol as rescue.  We will see you in follow-up in 4 to 6 months time call sooner should any new problems arise.

## 2022-11-04 NOTE — Progress Notes (Signed)
Patient was seen in the office today and instructed on use of Stiolto. Patient and daughter expressed understanding.

## 2022-12-06 ENCOUNTER — Telehealth: Payer: Self-pay | Admitting: Pulmonary Disease

## 2022-12-06 MED ORDER — STIOLTO RESPIMAT 2.5-2.5 MCG/ACT IN AERS: 2.0000 | INHALATION_SPRAY | Freq: Every day | RESPIRATORY_TRACT | 5 refills | Status: DC

## 2022-12-06 NOTE — Telephone Encounter (Signed)
I have sent in the prescription and notified the patient's daughter(DPR).  Noting further needed.

## 2022-12-06 NOTE — Telephone Encounter (Signed)
Pts daughter states they would like rx sent to [facility] for the stiolto.

## 2022-12-17 ENCOUNTER — Encounter: Payer: Self-pay | Admitting: Nurse Practitioner

## 2022-12-17 ENCOUNTER — Ambulatory Visit (INDEPENDENT_AMBULATORY_CARE_PROVIDER_SITE_OTHER): Payer: Medicare Other | Admitting: Nurse Practitioner

## 2022-12-17 VITALS — BP 134/73 | HR 75 | Temp 98.0°F | Ht 59.02 in | Wt 111.7 lb

## 2022-12-17 DIAGNOSIS — M546 Pain in thoracic spine: Secondary | ICD-10-CM

## 2022-12-17 MED ORDER — CYCLOBENZAPRINE HCL 5 MG PO TABS: 2.5000 mg | ORAL_TABLET | Freq: Three times a day (TID) | ORAL | 0 refills | Status: DC | PRN

## 2022-12-17 MED ORDER — METHYLPREDNISOLONE 4 MG PO TBPK: ORAL_TABLET | ORAL | 0 refills | Status: DC

## 2022-12-17 NOTE — Patient Instructions (Signed)
Acute Back Pain, Adult Acute back pain is sudden and usually short-lived. It is often caused by an injury to the muscles and tissues in the back. The injury may result from: A muscle, tendon, or ligament getting overstretched or torn. Ligaments are tissues that connect bones to each other. Lifting something improperly can cause a back strain. Wear and tear (degeneration) of the spinal disks. Spinal disks are circular tissue that provide cushioning between the bones of the spine (vertebrae). Twisting motions, such as while playing sports or doing yard work. A hit to the back. Arthritis. You may have a physical exam, lab tests, and imaging tests to find the cause of your pain. Acute back pain usually goes away with rest and home care. Follow these instructions at home: Managing pain, stiffness, and swelling Take over-the-counter and prescription medicines only as told by your health care provider. Treatment may include medicines for pain and inflammation that are taken by mouth or applied to the skin, or muscle relaxants. Your health care provider may recommend applying ice during the first 24-48 hours after your pain starts. To do this: Put ice in a plastic bag. Place a towel between your skin and the bag. Leave the ice on for 20 minutes, 2-3 times a day. Remove the ice if your skin turns bright red. This is very important. If you cannot feel pain, heat, or cold, you have a greater risk of damage to the area. If directed, apply heat to the affected area as often as told by your health care provider. Use the heat source that your health care provider recommends, such as a moist heat pack or a heating pad. Place a towel between your skin and the heat source. Leave the heat on for 20-30 minutes. Remove the heat if your skin turns bright red. This is especially important if you are unable to feel pain, heat, or cold. You have a greater risk of getting burned. Activity  Do not stay in bed. Staying in  bed for more than 1-2 days can delay your recovery. Sit up and stand up straight. Avoid leaning forward when you sit or hunching over when you stand. If you work at a desk, sit close to it so you do not need to lean over. Keep your chin tucked in. Keep your neck drawn back, and keep your elbows bent at a 90-degree angle (right angle). Sit high and close to the steering wheel when you drive. Add lower back (lumbar) support to your car seat, if needed. Take short walks on even surfaces as soon as you are able. Try to increase the length of time you walk each day. Do not sit, drive, or stand in one place for more than 30 minutes at a time. Sitting or standing for long periods of time can put stress on your back. Do not drive or use heavy machinery while taking prescription pain medicine. Use proper lifting techniques. When you bend and lift, use positions that put less stress on your back: Bend your knees. Keep the load close to your body. Avoid twisting. Exercise regularly as told by your health care provider. Exercising helps your back heal faster and helps prevent back injuries by keeping muscles strong and flexible. Work with a physical therapist to make a safe exercise program, as recommended by your health care provider. Do any exercises as told by your physical therapist. Lifestyle Maintain a healthy weight. Extra weight puts stress on your back and makes it difficult to have good   posture. Avoid activities or situations that make you feel anxious or stressed. Stress and anxiety increase muscle tension and can make back pain worse. Learn ways to manage anxiety and stress, such as through exercise. General instructions Sleep on a firm mattress in a comfortable position. Try lying on your side with your knees slightly bent. If you lie on your back, put a pillow under your knees. Keep your head and neck in a straight line with your spine (neutral position) when using electronic equipment like  smartphones or pads. To do this: Raise your smartphone or pad to look at it instead of bending your head or neck to look down. Put the smartphone or pad at the level of your face while looking at the screen. Follow your treatment plan as told by your health care provider. This may include: Cognitive or behavioral therapy. Acupuncture or massage therapy. Meditation or yoga. Contact a health care provider if: You have pain that is not relieved with rest or medicine. You have increasing pain going down into your legs or buttocks. Your pain does not improve after 2 weeks. You have pain at night. You lose weight without trying. You have a fever or chills. You develop nausea or vomiting. You develop abdominal pain. Get help right away if: You develop new bowel or bladder control problems. You have unusual weakness or numbness in your arms or legs. You feel faint. These symptoms may represent a serious problem that is an emergency. Do not wait to see if the symptoms will go away. Get medical help right away. Call your local emergency services (911 in the [place] Do not drive yourself to the hospital. Summary Acute back pain is sudden and usually short-lived. Use proper lifting techniques. When you bend and lift, use positions that put less stress on your back. Take over-the-counter and prescription medicines only as told by your health care provider, and apply heat or ice as told. This information is not intended to replace advice given to you by your health care provider. Make sure you discuss any questions you have with your health care provider. Document Revised: 11/10/2020 Document Reviewed: 11/10/2020 Elsevier Patient Education  2023 [employer]  

## 2022-12-17 NOTE — Progress Notes (Signed)
BP 134/73   Pulse 75   Temp 98 F (36.7 C) (Oral)   Ht 4' 11.02" (1.499 m)   Wt 111 lb 11.2 oz (50.7 kg)   SpO2 98%   BMI 22.55 kg/m    Subjective:    Patient ID: Lori Rangel, female    DOB: [birth year], 62 y.o.   MRN: [id number]  HPI: Lori Rangel is a 62 y.o. female  Chief Complaint  Patient presents with   Back Pain    Low back pain started about 2 weeks ago after moving 2 big bags of dirt, thought that it may fix it self but did not.    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  It did not start hurting for three days.  Duration: weeks Mechanism of injury: lifting Location: bilateral and low back Onset: sudden Severity: 5/10 at worst, not as bad as it was, easing up some Quality: dull, aching, and throbbing Frequency: intermittent Radiation: none Aggravating factors: lifting, movement, and bending Alleviating factors: rest, leftover muscle relaxer, Tylenol Status:  improving Treatments attempted:  leftover muscle relaxer, rest, and APAP  Relief with NSAIDs?: No NSAIDs Taken Nighttime pain:   occasional when first lies down Paresthesias / decreased sensation:  no Bowel / bladder incontinence:  no Fevers:  no Dysuria / urinary frequency:  no   Relevant past medical, surgical, family and social history reviewed and updated as indicated. Interim medical history since our last visit reviewed. Allergies and medications reviewed and updated.  Review of Systems  Constitutional:  Negative for activity change, appetite change, diaphoresis, fatigue and fever.  Respiratory:  Negative for cough, chest tightness, shortness of breath and wheezing.   Cardiovascular:  Negative for chest pain, palpitations and leg swelling.  Gastrointestinal: Negative.   Musculoskeletal:  Positive for back pain.  Neurological: Negative.   Psychiatric/Behavioral: Negative.      Per HPI unless specifically  indicated above     Objective:    BP 134/73   Pulse 75   Temp 98 F (36.7 C) (Oral)   Ht 4' 11.02" (1.499 m)   Wt 111 lb 11.2 oz (50.7 kg)   SpO2 98%   BMI 22.55 kg/m   Wt Readings from Last 3 Encounters:  12/17/22 111 lb 11.2 oz (50.7 kg)  11/04/22 113 lb 9.6 oz (51.5 kg)  10/08/22 114 lb (51.7 kg)    Physical Exam Vitals and nursing note reviewed.  Constitutional:      General: She is awake. She is not in acute distress.    Appearance: She is well-developed, well-groomed and underweight. She is not ill-appearing or toxic-appearing.  HENT:     Head: Normocephalic.     Right Ear: Hearing and external ear normal.     Left Ear: Hearing and external ear normal.  Eyes:     General: Lids are normal.        Right eye: No discharge.        Left eye: No discharge.     Conjunctiva/sclera: Conjunctivae normal.     Pupils: Pupils are equal, round, and reactive to light.  Neck:     Thyroid: No thyromegaly.     Vascular: No carotid bruit.  Cardiovascular:     Rate and Rhythm: Normal rate and regular rhythm.     Heart sounds: Normal heart sounds. No murmur heard.    No gallop.  Pulmonary:  Effort: Pulmonary effort is normal. No accessory muscle usage or respiratory distress.     Breath sounds: Normal breath sounds.  Abdominal:     General: Bowel sounds are normal. There is no distension.     Palpations: Abdomen is soft.     Tenderness: There is no abdominal tenderness.  Musculoskeletal:     Cervical back: Normal range of motion and neck supple.     Thoracic back: Spasms and tenderness present. No swelling, edema, lacerations or bony tenderness. Decreased range of motion (mild reduction to extension).     Lumbar back: Normal.     Right lower leg: No edema.     Left lower leg: No edema.     Comments: No rashes noted  Lymphadenopathy:     Cervical: No cervical adenopathy.  Skin:    General: Skin is warm and dry.  Neurological:     Mental Status: She is alert and oriented  to person, place, and time.     Deep Tendon Reflexes: Reflexes are normal and symmetric.     Reflex Scores:      Brachioradialis reflexes are 2+ on the right side and 2+ on the left side.      Patellar reflexes are 2+ on the right side and 2+ on the left side. Psychiatric:        Attention and Perception: Attention normal.        Mood and Affect: Mood normal.        Speech: Speech normal.        Behavior: Behavior normal. Behavior is cooperative.        Thought Content: Thought content normal.     Results for orders placed or performed in visit on 09/20/22  [id number] 11+Oxyco+Alc+Crt-Bund  Result Value Ref Range   Ethanol Negative Cutoff=0.020 %   Amphetamines, Urine Negative Cutoff=1000 ng/mL   Barbiturate Negative Cutoff=200 ng/mL   BENZODIAZ UR QL Negative Cutoff=200 ng/mL   Cannabinoid Quant, Ur See Final Results Cutoff=50 ng/mL   Cocaine (Metabolite) Negative Cutoff=300 ng/mL   OPIATE SCREEN URINE Negative Cutoff=300 ng/mL   Oxycodone/Oxymorphone, Urine Negative Cutoff=300 ng/mL   Phencyclidine Negative Cutoff=25 ng/mL   Methadone Screen, Urine Negative Cutoff=300 ng/mL   Propoxyphene Negative Cutoff=300 ng/mL   Meperidine Negative Cutoff=200 ng/mL   Tramadol Negative Cutoff=200 ng/mL   Creatinine 171.4 20.0 - 300.0 mg/dL   pH, Urine 5.9 4.5 - 8.9  CBC with Differential/Platelet  Result Value Ref Range   WBC 6.7 3.4 - 10.8 x10E3/uL   RBC 3.84 3.77 - 5.28 x10E6/uL   Hemoglobin 12.1 11.1 - 15.9 g/dL   Hematocrit 04.5 40.9 - 46.6 %   MCV 93 79 - 97 fL   MCH 31.5 26.6 - 33.0 pg   MCHC 33.8 31.5 - 35.7 g/dL   RDW 81.1 (L) 91.4 - 78.2 %   Platelets 247 150 - 450 x10E3/uL   Neutrophils 64 Not Estab. %   Lymphs 27 Not Estab. %   Monocytes 6 Not Estab. %   Eos 2 Not Estab. %   Basos 1 Not Estab. %   Neutrophils Absolute 4.3 1.4 - 7.0 x10E3/uL   Lymphocytes Absolute 1.8 0.7 - 3.1 x10E3/uL   Monocytes Absolute 0.4 0.1 - 0.9 x10E3/uL   EOS (ABSOLUTE) 0.1 0.0 - 0.4 x10E3/uL    Basophils Absolute 0.1 0.0 - 0.2 x10E3/uL   Immature Granulocytes 0 Not Estab. %   Immature Grans (Abs) 0.0 0.0 - 0.1 x10E3/uL  Comprehensive metabolic panel  Result Value  Ref Range   Glucose 101 (H) 70 - 99 mg/dL   BUN 9 8 - 27 mg/dL   Creatinine, Ser 1.19 0.57 - 1.00 mg/dL   eGFR 98 >14 NW/GNF/6.21   BUN/Creatinine Ratio 13 12 - 28   Sodium 144 134 - 144 mmol/L   Potassium 4.2 3.5 - 5.2 mmol/L   Chloride 104 96 - 106 mmol/L   CO2 24 20 - 29 mmol/L   Calcium 8.9 8.7 - 10.3 mg/dL   Total Protein 6.7 6.0 - 8.5 g/dL   Albumin 4.3 3.9 - 4.9 g/dL   Globulin, Total 2.4 1.5 - 4.5 g/dL   Albumin/Globulin Ratio 1.8 1.2 - 2.2   Bilirubin Total <0.2 0.0 - 1.2 mg/dL   Alkaline Phosphatase 123 (H) 44 - 121 IU/L   AST 20 0 - 40 IU/L   ALT 13 0 - 32 IU/L  Lipid Panel w/o Chol/HDL Ratio  Result Value Ref Range   Cholesterol, Total 174 100 - 199 mg/dL   Triglycerides 308 (H) 0 - 149 mg/dL   HDL 46 >65 mg/dL   VLDL Cholesterol Cal 49 (H) 5 - 40 mg/dL   LDL Chol Calc (NIH) 79 0 - 99 mg/dL  TSH  Result Value Ref Range   TSH 1.430 0.450 - 4.500 uIU/mL  VITAMIN D 25 Hydroxy (Vit-D Deficiency, Fractures)  Result Value Ref Range   Vit D, 25-Hydroxy 33.8 30.0 - 100.0 ng/mL  Vitamin B12  Result Value Ref Range   Vitamin B-12 1,155 232 - 1,245 pg/mL  T4, free  Result Value Ref Range   Free T4 0.71 (L) 0.82 - 1.77 ng/dL  Magnesium  Result Value Ref Range   Magnesium 2.1 1.6 - 2.3 mg/dL  Iron Binding Cap (TIBC)([facility])  Result Value Ref Range   Total Iron Binding Capacity 244 (L) 250 - 450 ug/dL   UIBC 784 696 - 295 ug/dL   Iron 49 27 - 284 ug/dL   Iron Saturation 20 15 - 55 %  Ferritin  Result Value Ref Range   Ferritin 649 (H) 15 - 150 ng/mL  Cannabinoid Conf, Ur  Result Value Ref Range   CANNABINOIDS Positive (A) Cutoff=50   Carboxy THC GC/MS Conf 229 Cutoff=15 ng/mL      Assessment & Plan:   Problem List Items Addressed This Visit       Other   Acute bilateral  thoracic back pain - Primary    Acute and slightly improving at this time, started after heavy lifting items.  At this time is slowly improving.  Will start steroid pack, educated patient on this, and recommend she continue Tylenol 1000 MG TID as needed.  Avoid Ibuprofen.  Sent in refills on Flexeril 2.5 MG dosing, which was offering her benefit at home.  Recommend use of heat pad at home and Voltaren gel as needed.  Return to office if worsening or ongoing over the next two weeks.      Relevant Medications   methylPREDNISolone (MEDROL DOSEPAK) 4 MG TBPK tablet   cyclobenzaprine (FLEXERIL) 5 MG tablet     Follow up plan: Return if symptoms worsen or fail to improve.

## 2022-12-17 NOTE — Assessment & Plan Note (Signed)
Acute and slightly improving at this time, started after heavy lifting items.  At this time is slowly improving.  Will start steroid pack, educated patient on this, and recommend she continue Tylenol 1000 MG TID as needed.  Avoid Ibuprofen.  Sent in refills on Flexeril 2.5 MG dosing, which was offering her benefit at home.  Recommend use of heat pad at home and Voltaren gel as needed.  Return to office if worsening or ongoing over the next two weeks.

## 2022-12-20 ENCOUNTER — Encounter: Payer: Self-pay | Admitting: Pulmonary Disease

## 2023-01-17 ENCOUNTER — Ambulatory Visit
Admission: RE | Admit: 2023-01-17 | Discharge: 2023-01-17 | Disposition: A | Discharge status: Home / Self Care | Payer: Medicare Other | Source: Ambulatory Visit | Attending: Nurse Practitioner | Admitting: Nurse Practitioner

## 2023-01-17 DIAGNOSIS — Z1231 Encounter for screening mammogram for malignant neoplasm of breast: Secondary | ICD-10-CM | POA: Diagnosis present

## 2023-03-16 NOTE — Patient Instructions (Signed)
Eating Plan for Chronic Obstructive Pulmonary Disease Chronic obstructive pulmonary disease (COPD) causes symptoms such as shortness of breath, coughing, and chest discomfort. These symptoms can make it difficult to eat enough to maintain a healthy weight. Generally, people with COPD should eat a diet that is high in calories, protein, and other nutrients to maintain body weight and to keep the lungs as healthy as possible. Depending on the medicines you take and other health conditions you may have, your health care provider may give you additional recommendations on what to eat or avoid. Talk with your health care provider about your goals for body weight, and work with a dietitian to develop an eating plan that is right for you. What are tips for following this plan? Reading food labels  Avoid foods with more than 300 milligrams (mg) of salt (sodium) per serving. Choose foods that contain at least 4 grams (g) of fiber per serving. Try to eat 20-30 g of fiber each day. Choose foods that are high in calories and protein, such as nuts, beans, yogurt, and cheese. Shopping Do not buy foods labeled as diet, low-calorie, or low-fat. If you are able to eat dairy products: Avoid low-fat or skim milk. Buy dairy products that have at least 2% fat. Buy nutritional supplement drinks. Buy grains and prepared foods labeled as enriched or fortified. Consider buying low-sodium, pre-made foods to conserve energy for eating. Cooking Add dry milk or protein powder to smoothies. Cook with healthy fats, such as olive oil, canola oil, sunflower oil, and grapeseed oil. Add oil, butter, cream cheese, or nut butters to foods to increase fat and calories. To make foods easier to chew and swallow: Cook vegetables, pasta, and rice until soft. Cut or grind meat into very small pieces. Dip breads in liquid. Meal planning  Eat when you feel hungry. Eat 5-6 small meals throughout the day. Drink 6-8 glasses of water  each day. Do not drink liquids with meals. Drink liquids at the end of the meal to avoid feeling full too quickly. Eat a variety of fruits and vegetables every day. Ask for assistance from family or friends with planning and preparing meals as needed. Avoid foods that cause you to feel bloated, such as carbonated drinks, fried foods, beans, broccoli, cabbage, and apples. For older adults, ask your local agency on aging whether you are eligible for meal assistance programs, such as Meals on Wheels. Lifestyle  Do not smoke. Eat slowly. Take small bites and chew food well before swallowing. Do not overeat. This may make it more difficult to breathe after eating. Sit up while eating. If needed, continue to use supplemental oxygen while eating. Rest or relax for 30 minutes before and after eating. Monitor your weight as told by your health care provider. Exercise as told by your health care provider. What foods should I eat? Fruits All fresh, dried, canned, or frozen fruits that do not cause gas. Vegetables All fresh, canned (no salt added), or frozen vegetables that do not cause gas. Grains Whole-grain bread. Enriched whole-grain pasta. Fortified whole-grain cereals. Fortified rice. Quinoa. Meats and other proteins Lean meat. Poultry. Fish. Dried beans. Unsalted nuts. Tofu. Eggs. Nut butters. Dairy Whole or 2% milk. Cheese. Yogurt. Fats and oils Olive oil. Canola oil. Butter. Margarine. Beverages Water. Vegetable juice (no salt added). Decaffeinated coffee. Decaffeinated or herbal tea. Seasonings and condiments Fresh or dried herbs. Low-salt or salt-free seasonings. Low-sodium soy sauce. The items listed above may not be a complete list of foods   and beverages you can eat. Contact a dietitian for more information. What foods should I avoid? Fruits Fruits that cause gas, such as apples or melon. Vegetables Vegetables that cause gas, such as broccoli, Brussels sprouts, cabbage,  cauliflower, and onions. Canned vegetables with added salt. Meats and other proteins Fried meat. Salt-cured meat. Processed meat. Dairy Fat-free or low-fat milk, yogurt, or cheese. Processed cheese. Beverages Carbonated drinks. Caffeinated drinks, such as coffee, tea, and soft drinks. Juice. Alcohol. Vegetable juice with added salt. Seasonings and condiments Salt. Seasoning mixes with salt. Soy sauce. Pickles. Other foods Clear soup or broth. Fried foods. Prepared frozen meals. The items listed above may not be a complete list of foods and beverages you should avoid. Contact a dietitian for more information. Summary COPD symptoms can make it difficult to eat enough to maintain a healthy weight. A COPD eating plan can help you maintain your body weight and keep your lungs as healthy as possible. Eat a diet that is high in calories, protein, and other nutrients. Read labels to make sure that you are getting the right nutrients. Cook foods to make them easier to chew and swallow. Eat 5-6 small meals throughout the day, and avoid foods that cause gas or make you feel bloated. This information is not intended to replace advice given to you by your health care provider. Make sure you discuss any questions you have with your health care provider. Document Revised: 06/27/2020 Document Reviewed: 06/27/2020 Elsevier Patient Education  2024 [employer]  

## 2023-03-21 ENCOUNTER — Encounter: Payer: Self-pay | Admitting: Nurse Practitioner

## 2023-03-21 ENCOUNTER — Ambulatory Visit (INDEPENDENT_AMBULATORY_CARE_PROVIDER_SITE_OTHER): Payer: Medicare Other | Admitting: Nurse Practitioner

## 2023-03-21 VITALS — BP 127/74 | HR 67 | Temp 98.0°F | Ht 59.02 in | Wt 104.4 lb

## 2023-03-21 DIAGNOSIS — Z87891 Personal history of nicotine dependence: Secondary | ICD-10-CM

## 2023-03-21 DIAGNOSIS — E440 Moderate protein-calorie malnutrition: Secondary | ICD-10-CM | POA: Diagnosis not present

## 2023-03-21 DIAGNOSIS — E039 Hypothyroidism, unspecified: Secondary | ICD-10-CM

## 2023-03-21 DIAGNOSIS — Z85118 Personal history of other malignant neoplasm of bronchus and lung: Secondary | ICD-10-CM

## 2023-03-21 DIAGNOSIS — F17219 Nicotine dependence, cigarettes, with unspecified nicotine-induced disorders: Secondary | ICD-10-CM

## 2023-03-21 DIAGNOSIS — I700 Atherosclerosis of aorta: Secondary | ICD-10-CM

## 2023-03-21 DIAGNOSIS — J432 Centrilobular emphysema: Secondary | ICD-10-CM | POA: Diagnosis not present

## 2023-03-21 DIAGNOSIS — E782 Mixed hyperlipidemia: Secondary | ICD-10-CM

## 2023-03-21 MED ORDER — LEVOTHYROXINE SODIUM 25 MCG PO TABS: 25.0000 ug | ORAL_TABLET | Freq: Every day | ORAL | 4 refills | Status: AC

## 2023-03-21 MED ORDER — IRON-VITAMIN C 65-125 MG PO TABS: 1.0000 | ORAL_TABLET | Freq: Two times a day (BID) | ORAL | 4 refills | Status: DC

## 2023-03-21 NOTE — Progress Notes (Signed)
BP 127/74   Pulse 67   Temp 98 F (36.7 C) (Oral)   Ht 4' 11.02" (1.499 m)   Wt 104 lb 6.4 oz (47.4 kg)   SpO2 95%   BMI 21.07 kg/m    Subjective:    Patient ID: Lori Rangel, female    DOB: April 19, 1961, 62 y.o.   MRN: [id number]  HPI: Lori Rangel is a 62 y.o. female  Chief Complaint  Patient presents with   Hyperlipidemia   COPD   Mood   Thyroid Problem   LUNG CA LEFT UPPER LOBE + EMPHYSEMA: History of lung cancer left lobe in 2019 with partial lobectomy.  Followed by pulmonary, Dr. Jayme Cloud, for right lower lobe nodule, saw her last 11/04/22 -- changed her from Trelegy to [facility] which she feels works better.  Had radiation therapy to nodule with last treatment 05/14/22.  Saw oncology last 06/24/22 -- goes for another chest scan at end of this month.  Has history of thoracic aortic atherosclerosis noted on imaging. She quit smoking after finding out about newest lung nodule > 6 months ago.  Used to smoke 1 1/2 PPD to 2 PPD. Started smoking at age 88.  She smoked for 49 years.  Continues Mirtazapine for weight, no further Marinol as could not obtain (this was started by [facility] oncology in past).   COPD status: stable Satisfied with current treatment?: yes Oxygen use: no Dyspnea frequency: no Cough frequency: no Rescue inhaler frequency: has not used since starting Stiolto Limitation of activity: no Productive cough:  none Last Spirometry: with pulmonary Pneumovax:  Up To Date Influenza: yes  HYPERLIPIDEMIA Continues on Crestor 10 MG daily.   Hyperlipidemia status: good compliance Satisfied with current treatment?  yes Side effects:  no Medication compliance: good compliance Supplements: none Aspirin:  yes The 10-year ASCVD risk score (Arnett DK, et al., 2019) is: 8.6%   Values used to calculate the score:     Age: 54 years     Sex: Female     Is Non-Hispanic African American: No     Diabetic: No     Tobacco smoker: Yes     Systolic Blood Pressure: 127  mmHg     Is BP treated: No     HDL Cholesterol: 46 mg/dL     Total Cholesterol: 174 mg/dL Chest pain:  no Coronary artery disease:  no Family history CAD:  yes in aunt Family history early CAD:  no   HYPOTHYROIDISM Continues on Levothyroxine 25 MCG daily.   Thyroid control status:stable Satisfied with current treatment? yes Medication side effects: no Medication compliance: good compliance Etiology of hypothyroidism:  Recent dose adjustment:no Fatigue: no Cold intolerance: no Heat intolerance: no Weight gain: no Weight loss: no Constipation: no Diarrhea/loose stools: no Palpitations: no Lower extremity edema: no Anxiety/depressed mood: no      03/21/2023    2:17 PM 12/17/2022    4:28 PM 10/08/2022    2:27 PM 09/20/2022    1:28 PM 03/19/2022    3:07 PM  Depression screen PHQ 2/9  Decreased Interest 0 0 0 0 0  Down, Depressed, Hopeless 0 0 0 0 0  PHQ - 2 Score 0 0 0 0 0  Altered sleeping 0 0 0 0 0  Tired, decreased energy 0 0 0 0 0  Change in appetite 0 0 0 0 0  Feeling bad or failure about yourself  0 0 0 0 0  Trouble concentrating 0 0 0 0 0  Moving slowly or fidgety/restless 0 0 0 0 0  Suicidal thoughts 0 0 0 0 0  PHQ-9 Score 0 0 0 0 0  Difficult doing work/chores Not difficult at all  Not difficult at all Not difficult at all Not difficult at all       03/21/2023    2:17 PM 12/17/2022    4:28 PM 09/20/2022    1:28 PM 03/19/2022    3:07 PM  GAD 7 : Generalized Anxiety Score  Nervous, Anxious, on Edge 0 0 0 0  Control/stop worrying 0 0 0 0  Worry too much - different things 0 0 0 0  Trouble relaxing 0 3 0 0  Restless 0 0 0 0  Easily annoyed or irritable 0 0 0 0  Afraid - awful might happen 0 0 0 0  Total GAD 7 Score 0 3 0 0  Anxiety Difficulty Not difficult at all  Not difficult at all Not difficult at all   Relevant past medical, surgical, family and social history reviewed and updated as indicated. Interim medical history since our last visit  reviewed. Allergies and medications reviewed and updated.  Review of Systems  Constitutional:  Negative for activity change, appetite change, diaphoresis, fatigue and fever.  Respiratory:  Negative for cough, chest tightness, shortness of breath and wheezing.   Cardiovascular:  Negative for chest pain, palpitations and leg swelling.  Gastrointestinal: Negative.   Endocrine: Negative for cold intolerance and heat intolerance.  Genitourinary: Negative.   Neurological: Negative.   Psychiatric/Behavioral: Negative.      Per HPI unless specifically indicated above     Objective:    BP 127/74   Pulse 67   Temp 98 F (36.7 C) (Oral)   Ht 4' 11.02" (1.499 m)   Wt 104 lb 6.4 oz (47.4 kg)   SpO2 95%   BMI 21.07 kg/m   Wt Readings from Last 3 Encounters:  03/21/23 104 lb 6.4 oz (47.4 kg)  12/17/22 111 lb 11.2 oz (50.7 kg)  11/04/22 113 lb 9.6 oz (51.5 kg)    Physical Exam Vitals and nursing note reviewed.  Constitutional:      General: She is awake. She is not in acute distress.    Appearance: She is well-developed, well-groomed and underweight. She is not ill-appearing or toxic-appearing.  HENT:     Head: Normocephalic.     Right Ear: Hearing and external ear normal.     Left Ear: Hearing and external ear normal.  Eyes:     General: Lids are normal.        Right eye: No discharge.        Left eye: No discharge.     Conjunctiva/sclera: Conjunctivae normal.     Pupils: Pupils are equal, round, and reactive to light.  Neck:     Thyroid: No thyromegaly.     Vascular: No carotid bruit.  Cardiovascular:     Rate and Rhythm: Normal rate and regular rhythm.     Heart sounds: Normal heart sounds. No murmur heard.    No gallop.  Pulmonary:     Effort: Pulmonary effort is normal. No accessory muscle usage or respiratory distress.     Breath sounds: Normal breath sounds.  Abdominal:     General: Bowel sounds are normal. There is no distension.     Palpations: Abdomen is soft.      Tenderness: There is no abdominal tenderness.  Musculoskeletal:     Cervical back: Normal range of motion and  neck supple.     Right lower leg: No edema.     Left lower leg: No edema.  Lymphadenopathy:     Cervical: No cervical adenopathy.  Skin:    General: Skin is warm and dry.  Neurological:     Mental Status: She is alert and oriented to person, place, and time.     Deep Tendon Reflexes: Reflexes are normal and symmetric.     Reflex Scores:      Brachioradialis reflexes are 2+ on the right side and 2+ on the left side.      Patellar reflexes are 2+ on the right side and 2+ on the left side. Psychiatric:        Attention and Perception: Attention normal.        Mood and Affect: Mood normal.        Speech: Speech normal.        Behavior: Behavior normal. Behavior is cooperative.        Thought Content: Thought content normal.    Results for orders placed or performed in visit on 09/20/22  [id number] 11+Oxyco+Alc+Crt-Bund  Result Value Ref Range   Ethanol Negative Cutoff=0.020 %   Amphetamines, Urine Negative Cutoff=1000 ng/mL   Barbiturate Negative Cutoff=200 ng/mL   BENZODIAZ UR QL Negative Cutoff=200 ng/mL   Cannabinoid Quant, Ur See Final Results Cutoff=50 ng/mL   Cocaine (Metabolite) Negative Cutoff=300 ng/mL   OPIATE SCREEN URINE Negative Cutoff=300 ng/mL   Oxycodone/Oxymorphone, Urine Negative Cutoff=300 ng/mL   Phencyclidine Negative Cutoff=25 ng/mL   Methadone Screen, Urine Negative Cutoff=300 ng/mL   Propoxyphene Negative Cutoff=300 ng/mL   Meperidine Negative Cutoff=200 ng/mL   Tramadol Negative Cutoff=200 ng/mL   Creatinine 171.4 20.0 - 300.0 mg/dL   pH, Urine 5.9 4.5 - 8.9  CBC with Differential/Platelet  Result Value Ref Range   WBC 6.7 3.4 - 10.8 x10E3/uL   RBC 3.84 3.77 - 5.28 x10E6/uL   Hemoglobin 12.1 11.1 - 15.9 g/dL   Hematocrit 14.7 82.9 - 46.6 %   MCV 93 79 - 97 fL   MCH 31.5 26.6 - 33.0 pg   MCHC 33.8 31.5 - 35.7 g/dL   RDW 56.2 (L) 13.0 - 86.5  %   Platelets 247 150 - 450 x10E3/uL   Neutrophils 64 Not Estab. %   Lymphs 27 Not Estab. %   Monocytes 6 Not Estab. %   Eos 2 Not Estab. %   Basos 1 Not Estab. %   Neutrophils Absolute 4.3 1.4 - 7.0 x10E3/uL   Lymphocytes Absolute 1.8 0.7 - 3.1 x10E3/uL   Monocytes Absolute 0.4 0.1 - 0.9 x10E3/uL   EOS (ABSOLUTE) 0.1 0.0 - 0.4 x10E3/uL   Basophils Absolute 0.1 0.0 - 0.2 x10E3/uL   Immature Granulocytes 0 Not Estab. %   Immature Grans (Abs) 0.0 0.0 - 0.1 x10E3/uL  Comprehensive metabolic panel  Result Value Ref Range   Glucose 101 (H) 70 - 99 mg/dL   BUN 9 8 - 27 mg/dL   Creatinine, Ser 7.84 0.57 - 1.00 mg/dL   eGFR 98 >69 GE/XBM/8.41   BUN/Creatinine Ratio 13 12 - 28   Sodium 144 134 - 144 mmol/L   Potassium 4.2 3.5 - 5.2 mmol/L   Chloride 104 96 - 106 mmol/L   CO2 24 20 - 29 mmol/L   Calcium 8.9 8.7 - 10.3 mg/dL   Total Protein 6.7 6.0 - 8.5 g/dL   Albumin 4.3 3.9 - 4.9 g/dL   Globulin, Total 2.4 1.5 - 4.5 g/dL  Albumin/Globulin Ratio 1.8 1.2 - 2.2   Bilirubin Total <0.2 0.0 - 1.2 mg/dL   Alkaline Phosphatase 123 (H) 44 - 121 IU/L   AST 20 0 - 40 IU/L   ALT 13 0 - 32 IU/L  Lipid Panel w/o Chol/HDL Ratio  Result Value Ref Range   Cholesterol, Total 174 100 - 199 mg/dL   Triglycerides 782 (H) 0 - 149 mg/dL   HDL 46 >95 mg/dL   VLDL Cholesterol Cal 49 (H) 5 - 40 mg/dL   LDL Chol Calc (NIH) 79 0 - 99 mg/dL  TSH  Result Value Ref Range   TSH 1.430 0.450 - 4.500 uIU/mL  VITAMIN D 25 Hydroxy (Vit-D Deficiency, Fractures)  Result Value Ref Range   Vit D, 25-Hydroxy 33.8 30.0 - 100.0 ng/mL  Vitamin B12  Result Value Ref Range   Vitamin B-12 1,155 232 - 1,245 pg/mL  T4, free  Result Value Ref Range   Free T4 0.71 (L) 0.82 - 1.77 ng/dL  Magnesium  Result Value Ref Range   Magnesium 2.1 1.6 - 2.3 mg/dL  Iron Binding Cap (TIBC)([facility])  Result Value Ref Range   Total Iron Binding Capacity 244 (L) 250 - 450 ug/dL   UIBC 621 308 - 657 ug/dL   Iron 49 27 - 846  ug/dL   Iron Saturation 20 15 - 55 %  Ferritin  Result Value Ref Range   Ferritin 649 (H) 15 - 150 ng/mL  Cannabinoid Conf, Ur  Result Value Ref Range   CANNABINOIDS Positive (A) Cutoff=50   Carboxy THC GC/MS Conf 229 Cutoff=15 ng/mL      Assessment & Plan:   Problem List Items Addressed This Visit       Cardiovascular and Mediastinum   Thoracic aortic atherosclerosis (HCC)    Chronic, stable.  Noted on past imaging in 2017.  Recommend complete cessation of smoking.  Continue statin at low dose daily and Baby ASA for preventative care.        Respiratory   COPD with emphysema (HCC) - Primary    Chronic, ongoing.  Followed by pulmonary, continue this collaboration.  Taking Stiolto with benefit, no recent use of Ventolin.  Recent pulmonary notes reviewed.       Relevant Orders   CBC with Differential/Platelet     Endocrine   Hypothyroidism    Chronic, ongoing.  Will continue current regimen at this time and adjust as needed based on labs.  TSH and Free T4 up to date.      Relevant Medications   levothyroxine (SYNTHROID) 25 MCG tablet     Other   History of cigarette smoking    Quit smoking July 2023, recommend continued cessation and praised for this.       History of lung cancer    Followed by pulmonary and oncology, continue this collaboration.  Recent notes reviewed.      Mixed hyperlipidemia    Chronic, ongoing.  Continue current medication regimen and adjust as needed.  Lipid panel today.      Relevant Orders   CBC with Differential/Platelet   Lipid Panel w/o Chol/HDL Ratio   Moderate protein-calorie malnutrition (HCC)    Ongoing issue, post lung CA initially years ago.  Some loss this check.  Will continue Mirtazapine as currently prescribed.  This is offering benefit to her appetite.  Eating three meals a day and snacks.  Check CMP today.  Monitor weight closely.      Relevant Orders   Comprehensive metabolic  panel   CBC with Differential/Platelet      Follow up plan: Return in about 6 months (around 09/21/2023) for COPD, LUNG CA, HLD, THYROID, GERD, WEIGHT CHECK.

## 2023-03-21 NOTE — Assessment & Plan Note (Signed)
Chronic, ongoing.  Will continue current regimen at this time and adjust as needed based on labs.  TSH and Free T4 up to date.

## 2023-03-21 NOTE — Assessment & Plan Note (Signed)
Quit smoking July 2023, recommend continued cessation and praised for this.

## 2023-03-21 NOTE — Assessment & Plan Note (Signed)
Chronic, ongoing.  Continue current medication regimen and adjust as needed. Lipid panel today. 

## 2023-03-21 NOTE — Assessment & Plan Note (Signed)
Chronic, ongoing.  Followed by pulmonary, continue this collaboration.  Taking Stiolto with benefit, no recent use of Ventolin.  Recent pulmonary notes reviewed.

## 2023-03-21 NOTE — Assessment & Plan Note (Signed)
Chronic, stable.  Noted on past imaging in 2017.  Recommend complete cessation of smoking.  Continue statin at low dose daily and Baby ASA for preventative care.

## 2023-03-21 NOTE — Assessment & Plan Note (Signed)
Followed by pulmonary and oncology, continue this collaboration.  Recent notes reviewed.

## 2023-03-21 NOTE — Assessment & Plan Note (Signed)
Ongoing issue, post lung CA initially years ago.  Some loss this check.  Will continue Mirtazapine as currently prescribed.  This is offering benefit to her appetite.  Eating three meals a day and snacks.  Check CMP today.  Monitor weight closely.

## 2023-03-22 ENCOUNTER — Other Ambulatory Visit: Payer: Self-pay | Admitting: Nurse Practitioner

## 2023-03-22 LAB — COMPREHENSIVE METABOLIC PANEL
ALT: 17 IU/L (ref 0–32)
AST: 22 IU/L (ref 0–40)
Albumin: 4.5 g/dL (ref 3.9–4.9)
Alkaline Phosphatase: 116 IU/L (ref 44–121)
BUN/Creatinine Ratio: 8 — ABNORMAL LOW (ref 12–28)
BUN: 6 mg/dL — ABNORMAL LOW (ref 8–27)
Bilirubin Total: 0.2 mg/dL (ref 0.0–1.2)
CO2: 24 mmol/L (ref 20–29)
Calcium: 9.2 mg/dL (ref 8.7–10.3)
Chloride: 103 mmol/L (ref 96–106)
Creatinine, Ser: 0.72 mg/dL (ref 0.57–1.00)
Globulin, Total: 2.4 g/dL (ref 1.5–4.5)
Glucose: 90 mg/dL (ref 70–99)
Potassium: 4.2 mmol/L (ref 3.5–5.2)
Sodium: 143 mmol/L (ref 134–144)
Total Protein: 6.9 g/dL (ref 6.0–8.5)
eGFR: 94 mL/min/{1.73_m2} (ref >59)

## 2023-03-22 LAB — LIPID PANEL W/O CHOL/HDL RATIO
Cholesterol, Total: 164 mg/dL (ref 100–199)
HDL: 51 mg/dL (ref >39)
LDL Chol Calc (NIH): 76 mg/dL (ref 0–99)
Triglycerides: 225 mg/dL — ABNORMAL HIGH (ref 0–149)
VLDL Cholesterol Cal: 37 mg/dL (ref 5–40)

## 2023-03-22 LAB — CBC WITH DIFFERENTIAL/PLATELET
Basophils Absolute: 0 10*3/uL (ref 0.0–0.2)
Basos: 0 %
EOS (ABSOLUTE): 0.1 10*3/uL (ref 0.0–0.4)
Eos: 1 %
Hematocrit: 36.9 % (ref 34.0–46.6)
Hemoglobin: 12 g/dL (ref 11.1–15.9)
Immature Grans (Abs): 0 10*3/uL (ref 0.0–0.1)
Immature Granulocytes: 0 %
Lymphocytes Absolute: 1.9 10*3/uL (ref 0.7–3.1)
Lymphs: 37 %
MCH: 30.6 pg (ref 26.6–33.0)
MCHC: 32.5 g/dL (ref 31.5–35.7)
MCV: 94 fL (ref 79–97)
Monocytes Absolute: 0.3 10*3/uL (ref 0.1–0.9)
Monocytes: 6 %
Neutrophils Absolute: 2.9 10*3/uL (ref 1.4–7.0)
Neutrophils: 56 %
Platelets: 263 10*3/uL (ref 150–450)
RBC: 3.92 x10E6/uL (ref 3.77–5.28)
RDW: 12.3 % (ref 11.7–15.4)
WBC: 5.2 10*3/uL (ref 3.4–10.8)

## 2023-03-22 MED ORDER — ROSUVASTATIN CALCIUM 20 MG PO TABS
20.0000 mg | ORAL_TABLET | Freq: Every day | ORAL | 4 refills | Status: DC
Start: 2023-03-22 — End: 2023-12-17

## 2023-03-22 NOTE — Progress Notes (Signed)
Good morning, please let Ausha know her labs have returned and overall remain stable with exception of some ongoing elevation in triglycerides.  I am going to increase your cholesterol medication, Rosuvastatin, to 20 MG.  Stop 10 MG tablets and start the 20 MG dosing.  This may offer tighter control and help with stroke & heart attack prevention.  Any questions? Keep being amazing!!  Thank you for allowing me to participate in your care.  I appreciate you. Kindest regards, Jamontae Thwaites

## 2023-03-31 ENCOUNTER — Ambulatory Visit
Admission: RE | Admit: 2023-03-31 | Discharge: 2023-03-31 | Disposition: A | Discharge status: Home / Self Care | Payer: Medicare Other | Source: Ambulatory Visit | Attending: Radiation Oncology | Admitting: Radiation Oncology

## 2023-03-31 DIAGNOSIS — R911 Solitary pulmonary nodule: Secondary | ICD-10-CM | POA: Diagnosis present

## 2023-03-31 MED ORDER — IOHEXOL 300 MG/ML  SOLN
75.0000 mL | Freq: Once | INTRAMUSCULAR | Status: AC | PRN
  Administered 2023-03-31: 75 mL via INTRAVENOUS

## 2023-04-02 ENCOUNTER — Encounter: Payer: Self-pay | Admitting: Radiation Oncology

## 2023-04-02 ENCOUNTER — Ambulatory Visit
Admission: RE | Admit: 2023-04-02 | Discharge: 2023-04-02 | Disposition: A | Discharge status: Home / Self Care | Payer: Medicare Other | Source: Ambulatory Visit | Attending: Radiation Oncology | Admitting: Radiation Oncology

## 2023-04-02 VITALS — BP 107/74 | HR 75 | Temp 98.0°F | Resp 16 | Ht 59.0 in | Wt 101.8 lb

## 2023-04-02 DIAGNOSIS — Z923 Personal history of irradiation: Secondary | ICD-10-CM | POA: Insufficient documentation

## 2023-04-02 DIAGNOSIS — R918 Other nonspecific abnormal finding of lung field: Secondary | ICD-10-CM | POA: Diagnosis not present

## 2023-04-02 DIAGNOSIS — R911 Solitary pulmonary nodule: Secondary | ICD-10-CM

## 2023-04-02 NOTE — Progress Notes (Signed)
Radiation Oncology Follow up Note  Name: Lori Rangel   Date:   04/02/2023 MRN:  [id number] DOB: 1961-03-04    This 62 y.o. 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  REFERRING PROVIDER: Marjie Skiff, NP  HPI: Patient is a 62 year old female now out 10 months having completed SBRT to her right lower lobe for stage I non-small cell lung cancer.  Previously treated to her left upper lobe for invasive squamous cell.  Seen today in routine follow-up she is doing well.  She is specifically [place] cough hemoptysis any change in her pulmonary status.  She had a CT scan has not been officially read but on my interpretation there is no significant change showing no evidence of active disease in her chest..  COMPLICATIONS OF TREATMENT: none  FOLLOW UP COMPLIANCE: keeps appointments   PHYSICAL EXAM:  BP 107/74   Pulse 75   Temp 98 F (36.7 C)   Resp 16   Ht 4\' 11"  (1.499 m)   Wt 101 lb 12.8 oz (46.2 kg)   BMI 20.56 kg/m  Well-developed well-nourished patient in NAD. HEENT reveals PERLA, EOMI, discs not visualized.  Oral cavity is clear. No oral mucosal lesions are identified. Neck is clear without evidence of cervical or supraclavicular adenopathy. Lungs are clear to A&P. Cardiac examination is essentially unremarkable with regular rate and rhythm without murmur rub or thrill. Abdomen is benign with no organomegaly or masses noted. Motor sensory and DTR levels are equal and symmetric in the upper and lower extremities. Cranial nerves II through XII are grossly intact. Proprioception is intact. No peripheral adenopathy or edema is identified. No motor or sensory levels are noted. Crude visual fields are within normal range.  RADIOLOGY RESULTS: Serial CT scans reviewed  PLAN: Present time patient is doing well.  No evidence  of disease by CT criteria.  Should her formal report change we will alert the patient to that.  Otherwise have asked to see her back in 6 months with a follow-up CT scan.  Patient comprehends my recommendations well.  I would like to take this opportunity to thank you for allowing me to participate in the care of your patient.Carmina Miller, MD

## 2023-05-09 ENCOUNTER — Encounter: Payer: Self-pay | Admitting: Nurse Practitioner

## 2023-05-19 ENCOUNTER — Telehealth: Payer: Self-pay | Admitting: Nurse Practitioner

## 2023-05-19 NOTE — Telephone Encounter (Signed)
Form started. Placed in providers folder for completion and signature.

## 2023-05-19 NOTE — Telephone Encounter (Signed)
Patient's daughter Jaquita Folds dropped off Disability Parking Placard form to be completed by Aura Dials, NP. I am placing form in providers folder. Informed daughter to allow provider 48-72 hours to complete/review/respond.Please contact daughter at [phone] when complete.

## 2023-05-20 NOTE — Telephone Encounter (Signed)
Form completed and signed by Jolene. Copy placed in the scan bin and original placed in completed bin to be picked up.    Called and notified patient's daughter that the form is completed and ready to be picked up.

## 2023-06-03 ENCOUNTER — Other Ambulatory Visit: Payer: Self-pay | Admitting: Pulmonary Disease

## 2023-08-25 ENCOUNTER — Encounter: Payer: Self-pay | Admitting: Pulmonary Disease

## 2023-08-25 ENCOUNTER — Ambulatory Visit: Payer: Medicare Other | Admitting: Pulmonary Disease

## 2023-08-25 VITALS — BP 110/68 | HR 73 | Temp 96.9°F | Ht 59.0 in | Wt 105.6 lb

## 2023-08-25 DIAGNOSIS — J449 Chronic obstructive pulmonary disease, unspecified: Secondary | ICD-10-CM | POA: Diagnosis not present

## 2023-08-25 DIAGNOSIS — Z23 Encounter for immunization: Secondary | ICD-10-CM

## 2023-08-25 DIAGNOSIS — R911 Solitary pulmonary nodule: Secondary | ICD-10-CM | POA: Diagnosis not present

## 2023-08-25 DIAGNOSIS — Z85118 Personal history of other malignant neoplasm of bronchus and lung: Secondary | ICD-10-CM

## 2023-08-25 DIAGNOSIS — F17210 Nicotine dependence, cigarettes, uncomplicated: Secondary | ICD-10-CM | POA: Diagnosis not present

## 2023-08-25 NOTE — Progress Notes (Signed)
Subjective:    Patient ID: Lori Rangel, female    DOB: 01/28/1961, 62 y.o.   MRN: [id number]  Patient Care Team: Marjie Skiff, NP as PCP - General (Nurse Practitioner) Antonieta Iba, MD as Consulting Physician (Cardiology) Salena Saner, MD as Consulting Physician (Pulmonary Disease) Carmina Miller, MD as Consulting Physician (Radiation Oncology)  Chief Complaint  Patient presents with   Follow-up    No SOB, wheezing or cough.    BACKGROUND/INTERVAL:Patient is a 62 year old former smoker (100 PY) with a history as noted below, who presents for follow-up of a right lung nodule and severe COPD.  Recall that in 2017 the patient developed a left upper lobe nodule, she underwent electromagnetic navigational bronchoscopy at [facility] by Dr. Belia Heman and this was nondiagnostic.  Subsequently she was referred to thoracic surgery at [facility] where she underwent evaluation with percutaneous needle biopsy which resulted in pneumothorax.  She subsequently underwent referral to radiation oncology as she was not an operative candidate.  Per the family's information, the patient did have recurrence after initial radiation and she required a second round of radiation to the left lung.  That left lung has significant volume loss due to postradiation changes. [name] primary care practitioner Aura Dials, NP ordered a CT scan of the chest performed on 28 March 2022, which has shown a nodule on the right lung that is 12 mm in diameter.  The patient then underwent PET/CT on 12 April 2022 which showed FDG activity on the nodule in question.  There was no other activity noted elsewhere.  Because she was not a surgical candidate nor candidate for invasive procedures.  She was referred to Dr. Carmina Miller for SBRT and she underwent 5 treatments of SBRT to this nodule.  She has had a follow-up CT chest on 24 September 2022 shows near complete resolution of the nodule in the right lower lobe, stable mild  nodularity of the pleura continues with areas of scarring on the left chest.  Mild fullness of a left axillary lymph node.  She was last seen here on 04 November 2022 follows here mostly for issues with COPD.  This is a routine follow-up visit.  HPI Discussed the use of AI scribe software for clinical note transcription with the patient, who gave verbal consent to proceed.  History of Present Illness   Lori Rangel, a patient with a history of stage three COPD and lung cancer, presents for a follow-up visit. She reports significant improvement in her shortness of breath, stating she no longer experiences this symptom. She attributes this improvement to her daily use of the inhaler, Stiolto, which she expresses satisfaction with. However, she admits to only using one puff daily instead of the prescribed two.  She has not experienced any difficulties in obtaining the inhaler and reports that it is well-covered by her insurance. She has not had any issues with cough, fever, or chills. She is due for a follow-up with her radiation oncologist Dr. Rushie Chestnut, she has a pending CT chest scheduled for 31 January.   She reports cessation of smoking following her second diagnosis of lung cancer. She has received her flu shot but needs an update on her pneumonia vaccine. She expresses willingness to receive the pneumonia vaccine during this visit.      DATA 04/11/2022 PFTs: FEV1 0.82 L or 35% predicted, FVC 2.01 L or 66% predicted, FEV1/FVC 41%.  There is mild hyperinflation on lung volumes.  Severe diffusion capacity impairment.  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  Review of Systems A 10 point review of systems was performed and it is as noted above otherwise negative.   Patient Active Problem List   Diagnosis Date Noted   GERD without esophagitis 09/19/2021   Mixed hyperlipidemia 03/09/2021   Iron deficiency anemia secondary to inadequate dietary iron intake 07/12/2020   Hypothyroidism  07/12/2020   Medication monitoring encounter 07/12/2020   History of lung cancer 07/07/2020   History of alcohol abuse 10/09/2018   Uses walker 06/03/2018   History of fracture of right hip 11/03/2017   Thoracic aortic atherosclerosis (HCC) 06/07/2016   COPD with emphysema (HCC) 01/25/2016   Carotid stenosis 12/31/2015   History of cigarette smoking 12/31/2015   Right lower lobe pulmonary nodule 12/31/2015   Moderate protein-calorie malnutrition (HCC) 12/17/2015    Social History   Tobacco Use   Smoking status: Former    Current packs/day: 0.00    Average packs/day: 2.0 packs/day for 50.0 years (100.0 ttl pk-yrs)    Types: Cigarettes    Start date: 04/03/1972    Quit date: 04/03/2022    Years since quitting: 1.3   Smokeless tobacco: Never   Tobacco comments:    Quit on 04/03/2022  Substance Use Topics   Alcohol use: Not Currently    Alcohol/week: 0.0 standard drinks of alcohol    Allergies  Allergen Reactions   Levaquin [Levofloxacin] Nausea Only    Cramps & Body aches    Current Meds  Medication Sig   acetaminophen (TYLENOL) 500 MG tablet Take 500-1,000 mg by mouth every 8 (eight) hours as needed for mild constipation or moderate pain.   aspirin 81 MG EC tablet Take 1 tablet by mouth daily.   cholecalciferol (VITAMIN D3) 25 MCG (1000 UNIT) tablet Take 1,000 Units by mouth daily.   cyclobenzaprine (FLEXERIL) 5 MG tablet Take 0.5 tablets (2.5 mg total) by mouth 3 (three) times daily as needed for muscle spasms.   Iron-Vitamin C 65-125 MG TABS Take 1 tablet by mouth in the morning and at bedtime.   levothyroxine (SYNTHROID) 25 MCG tablet Take 1 tablet (25 mcg total) by mouth daily before breakfast.   mirtazapine (REMERON) 15 MG tablet Take 1 tablet (15 mg total) by mouth at bedtime.   Multiple Vitamin (MULTIVITAMIN WITH MINERALS) TABS tablet Take 1 tablet by mouth daily.   omeprazole (PRILOSEC) 20 MG capsule Take 1 capsule (20 mg total) by mouth daily.   rosuvastatin  (CRESTOR) 20 MG tablet Take 1 tablet (20 mg total) by mouth daily.   STIOLTO RESPIMAT 2.5-2.5 MCG/ACT AERS INHALE TWO PUFFS INTO THE LUNGS EVERY DAY   VENTOLIN HFA 108 (90 Base) MCG/ACT inhaler INHALE TWO PUFFS EVERY SIX HOURS AS NEEDED FOR WHEEZING OR SHORTNESS OF BREATH    Immunization History  Administered Date(s) Administered   Influenza Inj Mdck Quad Pf 06/03/2018   Influenza Split 07/04/2015   Influenza,inj,Quad PF,6+ Mos 06/03/2018, 06/15/2019   Influenza-Unspecified 06/06/2016, 06/21/2020, 07/18/2021, 08/22/2022, 07/26/2023   Moderna Sars-Covid-2 Vaccination 05/27/2020, 06/24/2020   Pfizer Covid-19 Vaccine Bivalent Booster 33yrs & up 12/23/2020   Pneumococcal Polysaccharide-23 09/08/2020   Td 09/20/2022        Objective:     BP 110/68 (BP Location: Right Arm, Cuff Size: Normal)   Pulse 73   Temp (!) 96.9 F (36.1 C)   Ht 4\' 11"  (1.499 m)   Wt 105 lb 9.6 oz (47.9 kg)   SpO2 95%   BMI 21.33 kg/m  SpO2: 95 % O2 Device: None (Room air)  GENERAL: Debilitated, frail-appearing woman, looks markedly older than stated age, no acute distress, ambulates with walker. HEAD: Normocephalic, atraumatic.  EYES: Pupils equal, round, reactive to light.  No scleral icterus.  MOUTH: Poor dentition missing teeth lower, teeth in poor repair upper, pharynx clear. NECK: Supple. No thyromegaly. Trachea midline. No JVD.  No adenopathy. PULMONARY: Distant breath sounds bilaterally.  Faint end expiratory wheezes throughout, no rhonchi. CARDIOVASCULAR: S1 and S2. Regular rate and rhythm.  ABDOMEN: Benign. MUSCULOSKELETAL: No joint deformity, no clubbing, no edema.  NEUROLOGIC: Unsteady gait, requires walker.  No overt localizing finding. SKIN: Intact,warm,dry. PSYCH: Mood and behavior normal.   Assessment & Plan:     ICD-10-CM   1. Stage 3 severe COPD by GOLD classification (HCC)  J44.9     2. History of lung cancer - Squamous cell LUL  Z85.118     3. Lung nodule  R91.1     4.  Tobacco dependence due to cigarettes  F17.210     5. Need for pneumococcal 20-valent conjugate vaccination  Z23       Orders Placed This Encounter  Procedures   Pneumococcal conjugate vaccine 20-valent (Prevnar-20)   Discussion:    Chronic Obstructive Pulmonary Disease (COPD) Stage III COPD Stage III, well-managed with Stiolto. Reports significant improvement in dyspnea and no recent exacerbations. Currently using one puff daily instead of the recommended two. Instructed on correct usage to maximize benefits. - Use Stiolto, two puffs once daily - Follow-up in four months  Lung Cancer Lung cancer with recent clear chest CT scan. Next scan scheduled for October 03, 2023. If clear, follow-up scans will be extended to yearly intervals. Monitored by Dr. Rushie Chestnut. - Continue follow-up with Dr. Rushie Chestnut for chest CT scans - Next scan on October 03, 2023  General Health Maintenance Received flu shot. Pneumonia vaccine due and will be administered today. Informed that it is a lifetime vaccine. - Administer pneumonia vaccine today.     Advised if symptoms do not improve or worsen, to please contact office for sooner follow up or seek emergency care.    I spent 35 minutes of dedicated to the care of this patient on the date of this encounter to include pre-visit review of records, face-to-face time with the patient discussing conditions above, post visit ordering of testing, clinical documentation with the electronic health record, making appropriate referrals as documented, and communicating necessary findings to members of the patients care team.     C. Danice Goltz, MD Advanced Bronchoscopy PCCM [facility]    *This note was generated using voice recognition software/Dragon and/or AI transcription program.  Despite best efforts to proofread, errors can occur which can change the meaning. Any transcriptional errors that result from this process are unintentional and  may not be fully corrected at the time of dictation.

## 2023-08-25 NOTE — Patient Instructions (Signed)
VISIT SUMMARY:  During today's visit, we discussed your COPD and lung cancer management. You reported significant improvement in your breathing since using the Stiolto inhaler, although you have been using it less frequently than prescribed. We also reviewed your recent clear scan results and your upcoming follow-up scan. Additionally, we addressed your general health maintenance, including your flu shot and the need for a pneumonia vaccine.  YOUR PLAN:  -CHRONIC OBSTRUCTIVE PULMONARY DISEASE (COPD) STAGE III: COPD is a chronic lung condition that makes it hard to breathe. You reported feeling much better with the use of the Stiolto inhaler, but you need to use two puffs daily as prescribed to get the full benefit. Please continue using Stiolto as directed and follow up in four months.  -LUNG CANCER: Lung cancer is a type of cancer that begins in the lungs. Your recent scan was clear, and your next scan is scheduled for October 03, 2023. If that scan is also clear, your follow-up scans will be extended to yearly intervals. Continue your follow-up with Dr. Rushie Chestnut.  -GENERAL HEALTH MAINTENANCE: You have received your flu shot, and today you will receive the pneumonia vaccine, which is a lifetime vaccine. This will help protect you from pneumonia.  INSTRUCTIONS:  Please use your Stiolto inhaler as prescribed, two puffs once daily. Follow up with Dr. Rushie Chestnut for your next lung scan on October 03, 2023. Return for a follow-up visit in four months to review your COPD management.

## 2023-09-02 ENCOUNTER — Encounter: Payer: Self-pay | Admitting: Pulmonary Disease

## 2023-09-15 ENCOUNTER — Other Ambulatory Visit: Payer: Self-pay | Admitting: Nurse Practitioner

## 2023-09-16 NOTE — Telephone Encounter (Signed)
 Requested Prescriptions  Pending Prescriptions Disp Refills   mirtazapine  (REMERON ) 15 MG tablet [Pharmacy Med Name: MIRTAZAPINE  15 MG TAB] 90 tablet 0    Sig: TAKE ONE TABLET BY MOUTH AT BEDTIME     Psychiatry: Antidepressants - mirtazapine  Passed - 09/16/2023  5:54 PM      Passed - Valid encounter within last 6 months    Recent Outpatient Visits           5 months ago Centrilobular emphysema (HCC)   [place] [facility] [place], [place] T, NP   9 months ago Acute bilateral thoracic back pain   [place] [facility] [place], [place] T, NP   12 months ago Centrilobular emphysema (HCC)   [place] [facility] [place], Melanie T, NP   1 year ago Centrilobular emphysema (HCC)   [place] [facility] [place], Melanie T, NP   1 year ago Centrilobular emphysema (HCC)   [place] [facility] [place], Melanie DASEN, NP       Future Appointments             In 1 week [place], Melanie DASEN, NP  [facility]

## 2023-09-21 NOTE — Patient Instructions (Incomplete)

## 2023-09-26 ENCOUNTER — Ambulatory Visit: Payer: Medicare Other | Admitting: Nurse Practitioner

## 2023-09-26 DIAGNOSIS — Z87891 Personal history of nicotine dependence: Secondary | ICD-10-CM

## 2023-09-26 DIAGNOSIS — Z85118 Personal history of other malignant neoplasm of bronchus and lung: Secondary | ICD-10-CM

## 2023-09-26 DIAGNOSIS — J432 Centrilobular emphysema: Secondary | ICD-10-CM

## 2023-09-26 DIAGNOSIS — I6523 Occlusion and stenosis of bilateral carotid arteries: Secondary | ICD-10-CM

## 2023-09-26 DIAGNOSIS — D508 Other iron deficiency anemias: Secondary | ICD-10-CM

## 2023-09-26 DIAGNOSIS — E039 Hypothyroidism, unspecified: Secondary | ICD-10-CM

## 2023-09-26 DIAGNOSIS — I700 Atherosclerosis of aorta: Secondary | ICD-10-CM

## 2023-09-26 DIAGNOSIS — E440 Moderate protein-calorie malnutrition: Secondary | ICD-10-CM

## 2023-09-26 DIAGNOSIS — E782 Mixed hyperlipidemia: Secondary | ICD-10-CM

## 2023-10-03 ENCOUNTER — Ambulatory Visit: Admission: RE | Admit: 2023-10-03 | Payer: Medicare Other | Source: Ambulatory Visit

## 2023-10-09 ENCOUNTER — Ambulatory Visit: Payer: Medicare Other | Admitting: Radiation Oncology

## 2023-10-14 ENCOUNTER — Ambulatory Visit: Payer: Medicare Other | Admitting: Emergency Medicine

## 2023-10-14 VITALS — Ht 62.0 in | Wt 107.0 lb

## 2023-10-14 DIAGNOSIS — Z0000 Encounter for general adult medical examination without abnormal findings: Secondary | ICD-10-CM

## 2023-10-14 NOTE — Progress Notes (Signed)
Subjective:   Lori Rangel is a 63 y.o. female who presents for Medicare Annual (Subsequent) preventive examination.  This patient declined Interactive audio and Acupuncturist. Therefore the visit was completed with audio only.   Visit Complete: Virtual I connected with  Noel Christmas on 10/14/23 by a audio enabled telemedicine application and verified that I am speaking with the correct person using two identifiers.  Patient Location: Home  Provider Location: Home Office  I discussed the limitations of evaluation and management by telemedicine. The patient expressed understanding and agreed to proceed.  Vital Signs: Because this visit was a virtual/telehealth visit, some criteria may be missing or patient reported. Any vitals not documented were not able to be obtained and vitals that have been documented are patient reported.   Cardiac Risk Factors include: dyslipidemia;smoking/ tobacco exposure     Objective:    Today's Vitals   10/14/23 1303  Weight: 107 lb (48.5 kg)  Height: 5\' 2"  (1.575 m)   Body mass index is 19.57 kg/m.     10/14/2023    1:15 PM 10/08/2022    2:29 PM 09/30/2022   11:39 AM 06/24/2022   11:14 AM 08/16/2021   11:20 AM 08/04/2020    9:49 AM 08/27/2018    7:54 AM  Advanced Directives  Does Patient Have a Medical Advance Directive? No No Yes  No Yes Yes  Type of Chief of Staff of [place];Living will  Healthcare Power of [place];Living will Healthcare Power of [place];Living will  Does patient want to make changes to medical advance directive?  No - Patient declined No - Patient declined No - Patient declined     Copy of Healthcare Power of Attorney in Chart?   No - copy requested   No - copy requested No - copy requested  Would patient like information on creating a medical advance directive? Yes (MAU/Ambulatory/Procedural Areas - Information given) No - Patient declined No - Patient  declined  No - Patient declined  No - Patient declined    Current Medications (verified) Outpatient Encounter Medications as of 10/14/2023  Medication Sig   acetaminophen (TYLENOL) 500 MG tablet Take 500-1,000 mg by mouth every 8 (eight) hours as needed for mild constipation or moderate pain.   aspirin 81 MG EC tablet Take 1 tablet by mouth daily.   cholecalciferol (VITAMIN D3) 25 MCG (1000 UNIT) tablet Take 1,000 Units by mouth daily.   Iron-Vitamin C 65-125 MG TABS Take 1 tablet by mouth in the morning and at bedtime.   levothyroxine (SYNTHROID) 25 MCG tablet Take 1 tablet (25 mcg total) by mouth daily before breakfast.   mirtazapine (REMERON) 15 MG tablet TAKE ONE TABLET BY MOUTH AT BEDTIME   Multiple Vitamin (MULTIVITAMIN WITH MINERALS) TABS tablet Take 1 tablet by mouth daily.   omeprazole (PRILOSEC) 20 MG capsule Take 1 capsule (20 mg total) by mouth daily.   rosuvastatin (CRESTOR) 20 MG tablet Take 1 tablet (20 mg total) by mouth daily.   STIOLTO RESPIMAT 2.5-2.5 MCG/ACT AERS INHALE TWO PUFFS INTO THE LUNGS EVERY DAY   VENTOLIN HFA 108 (90 Base) MCG/ACT inhaler INHALE TWO PUFFS EVERY SIX HOURS AS NEEDED FOR WHEEZING OR SHORTNESS OF BREATH   cyclobenzaprine (FLEXERIL) 5 MG tablet Take 0.5 tablets (2.5 mg total) by mouth 3 (three) times daily as needed for muscle spasms. (Patient not taking: Reported on 10/14/2023)   No facility-administered encounter medications on file as of 10/14/2023.  Allergies (verified) Levaquin [levofloxacin]   History: Past Medical History:  Diagnosis Date   Asthma    Cancer (HCC)    lung   COPD (chronic obstructive pulmonary disease) (HCC)    Severe by PFTs 2017   Shortness of breath dyspnea    Squamous cell carcinoma lung, left (HCC) 2017   Radiation therapy, patient was not surgical candidate   Thyroid disease    Weight decrease major recent weight loss   Past Surgical History:  Procedure Laterality Date   BREAST BIOPSY Right    COLONOSCOPY      ELECTROMAGNETIC NAVIGATION BROCHOSCOPY N/A 01/02/2016   Procedure: ELECTROMAGNETIC NAVIGATION BRONCHOSCOPY;  Surgeon: Erin Fulling, MD;  Location: [facility] ORS;  Service: Cardiopulmonary;  Laterality: N/A;   ESOPHAGOGASTRODUODENOSCOPY (EGD) WITH PROPOFOL N/A 08/27/2018   Procedure: ESOPHAGOGASTRODUODENOSCOPY (EGD) WITH PROPOFOL;  Surgeon: Midge Minium, MD;  Location: [facility] ENDOSCOPY;  Service: Endoscopy;  Laterality: N/A;   INTRAMEDULLARY (IM) NAIL INTERTROCHANTERIC Right 11/03/2017   Procedure: INTRAMEDULLARY (IM) NAIL INTERTROCHANTRIC;  Surgeon: Signa Kell, MD;  Location: [facility] ORS;  Service: Orthopedics;  Laterality: Right;   Family History  Problem Relation Age of Onset   Cirrhosis Father    Lung cancer Sister    Breast cancer Neg Hx    Social History   Socioeconomic History   Marital status: Widowed    Spouse name: Not on file   Number of children: 2   Years of education: Not on file   Highest education level: Not on file  Occupational History   Occupation: disability  Tobacco Use   Smoking status: Former    Current packs/day: 0.00    Average packs/day: 2.0 packs/day for 50.0 years (100.0 ttl pk-yrs)    Types: Cigarettes    Start date: 04/03/1972    Quit date: 04/03/2022    Years since quitting: 1.5   Smokeless tobacco: Never   Tobacco comments:    Quit on 04/03/2022  Vaping Use   Vaping status: Not on file  Substance and Sexual Activity   Alcohol use: Not Currently    Alcohol/week: 0.0 standard drinks of alcohol   Drug use: No   Sexual activity: Not Currently  Other Topics Concern   Not on file  Social History Narrative   Not on file   Social Drivers of Health   Financial Resource Strain: Low Risk  (10/14/2023)   Overall Financial Resource Strain (CARDIA)    Difficulty of Paying Living Expenses: Not hard at all  Food Insecurity: No Food Insecurity (10/14/2023)   Hunger Vital Sign    Worried About Running Out of Food in the Last Year: Never true    Ran Out of Food in the  Last Year: Never true  Transportation Needs: No Transportation Needs (10/14/2023)   PRAPARE - Administrator, Civil Service (Medical): No    Lack of Transportation (Non-Medical): No  Physical Activity: Insufficiently Active (10/14/2023)   Exercise Vital Sign    Days of Exercise per Week: 2 days    Minutes of Exercise per Session: 10 min  Stress: No Stress Concern Present (10/14/2023)   [employer] of Occupational Health - Occupational Stress Questionnaire    Feeling of Stress : Not at all  Social Connections: Socially Isolated (10/14/2023)   Social Connection and Isolation Panel [NHANES]    Frequency of Communication with Friends and Family: More than three times a week    Frequency of Social Gatherings with Friends and Family: More than three times a week  Attends Religious Services: Never    Active Member of Clubs or Organizations: No    Attends Banker Meetings: Never    Marital Status: Widowed    Tobacco Counseling Counseling given: Not Answered Tobacco comments: Quit on 04/03/2022   Clinical Intake:  Pre-visit preparation completed: Yes  Pain : No/denies pain     BMI - recorded: 19.57 Nutritional Status: BMI of 19-24  Normal Nutritional Risks: None Diabetes: No  How often do you need to have someone help you when you read instructions, pamphlets, or other written materials from your doctor or pharmacy?: 1 - Never  Interpreter Needed?: No  Information entered by :: Tora Kindred, CMA   Activities of Daily Living    10/14/2023    1:06 PM  In your present state of health, do you have any difficulty performing the following activities:  Hearing? 0  Vision? 0  Difficulty concentrating or making decisions? 0  Walking or climbing stairs? 1  Dressing or bathing? 0  Doing errands, shopping? 1  Comment doesn't drive, family takes to appointments  Preparing Food and eating ? N  Using the Toilet? N  In the past six months, have you  accidently leaked urine? N  Do you have problems with loss of bowel control? N  Managing your Medications? N  Managing your Finances? N  Housekeeping or managing your Housekeeping? N    Patient Care Team: Marjie Skiff, NP as PCP - General (Nurse Practitioner) Antonieta Iba, MD as Consulting Physician (Cardiology) Salena Saner, MD as Consulting Physician (Pulmonary Disease) Carmina Miller, MD as Consulting Physician (Radiation Oncology)  Indicate any recent Medical Services you may have received from other than [facility] providers in the past year (date may be approximate).     Assessment:   This is a routine wellness examination for [place].  Hearing/Vision screen Hearing Screening - Comments:: Denies hearing loss Vision Screening - Comments:: Does not get eye exams, gave list of doctors in [place]   Goals Addressed             This Visit's Progress    COMPLETED: Patient Stated       08/04/2020, wants to stop needing the walker     Patient Stated       Maintain current health      Depression Screen    10/14/2023    1:13 PM 03/21/2023    2:17 PM 12/17/2022    4:28 PM 10/08/2022    2:27 PM 09/20/2022    1:28 PM 03/19/2022    3:07 PM 08/16/2021   11:37 AM  PHQ 2/9 Scores  PHQ - 2 Score 0 0 0 0 0 0 0  PHQ- 9 Score  0 0 0 0 0     Fall Risk    10/14/2023    1:17 PM 12/17/2022    4:28 PM 10/08/2022    2:30 PM 09/20/2022    1:48 PM 09/20/2022    1:27 PM  Fall Risk   Falls in the past year? 0 0 0 0 0  Number falls in past yr: 0 0 0 0 0  Injury with Fall? 0 0 0 0 0  Risk for fall due to : No Fall Risks No Fall Risks No Fall Risks No Fall Risks Impaired mobility;History of fall(s)  Follow up Falls prevention discussed;Falls evaluation completed Falls evaluation completed Falls prevention discussed;Falls evaluation completed Education provided     MEDICARE RISK AT HOME: Medicare Risk at Home Any  stairs in or around the home?: Yes If so, are there any without  handrails?: No Home free of loose throw rugs in walkways, pet beds, electrical cords, etc?: Yes Adequate lighting in your home to reduce risk of falls?: Yes Life alert?: No Use of a cane, walker or w/c?: No Grab bars in the bathroom?: No Shower chair or bench in shower?: Yes Elevated toilet seat or a handicapped toilet?: No  TIMED UP AND GO:  Was the test performed?  No    Cognitive Function:        10/14/2023    1:18 PM 10/08/2022    2:35 PM 09/20/2022    1:50 PM 09/20/2022    1:17 PM 08/04/2020    9:53 AM  6CIT Screen  What Year? 0 points 0 points 0 points 0 points 0 points  What month? 0 points 0 points 0 points 0 points 0 points  What time? 0 points 0 points 0 points 0 points 0 points  Count back from 20 0 points 0 points 0 points 0 points 0 points  Months in reverse 4 points 0 points 0 points 0 points 4 points  Repeat phrase 8 points 0 points 0 points 0 points 6 points  Total Score 12 points 0 points 0 points 0 points 10 points    Immunizations Immunization History  Administered Date(s) Administered   Influenza Inj Mdck Quad Pf 06/03/2018   Influenza Split 07/04/2015   Influenza,inj,Quad PF,6+ Mos 06/03/2018, 06/15/2019   Influenza-Unspecified 06/06/2016, 06/21/2020, 07/18/2021, 08/22/2022, 07/26/2023   Moderna Sars-Covid-2 Vaccination 05/27/2020, 06/24/2020   PNEUMOCOCCAL CONJUGATE-20 08/25/2023   Pfizer Covid-19 Vaccine Bivalent Booster 43yrs & up 12/23/2020   Pneumococcal Polysaccharide-23 09/08/2020   Td 09/20/2022    TDAP status: Up to date  Flu Vaccine status: Up to date  Pneumococcal vaccine status: Up to date  Covid-19 vaccine status: Declined, Education has been provided regarding the importance of this vaccine but patient still declined. Advised may receive this vaccine at local pharmacy or Health Dept.or vaccine clinic. Aware to provide a copy of the vaccination record if obtained from local pharmacy or Health Dept. Verbalized acceptance and  understanding.  Qualifies for Shingles Vaccine? Yes   Zostavax completed No   Shingrix Completed?: No.    Education has been provided regarding the importance of this vaccine. Patient has been advised to call insurance company to determine out of pocket expense if they have not yet received this vaccine. Advised may also receive vaccine at local pharmacy or Health Dept. Verbalized acceptance and understanding. Patient declined  Screening Tests Health Maintenance  Topic Date Due   COVID-19 Vaccine (4 - 2024-25 season) 05/04/2023   Zoster Vaccines- Shingrix (1 of 2) 12/20/2023 (Originally [birth year]20/1978)   Cervical Cancer Screening (HPV/Pap Cotest)  09/20/2024 (Originally 10/20/1989)   Lung Cancer Screening  03/30/2024   Fecal DNA (Cologuard)  10/01/2024   Medicare Annual Wellness (AWV)  10/13/2024   MAMMOGRAM  01/16/2025   DTaP/Tdap/Td (2 - Tdap) 09/20/2032   Pneumococcal Vaccine 35-17 Years old  Completed   INFLUENZA VACCINE  Completed   Hepatitis C Screening  Completed   HIV Screening  Completed   HPV VACCINES  Aged Out    Health Maintenance  Health Maintenance Due  Topic Date Due   COVID-19 Vaccine (4 - 2024-25 season) 05/04/2023    Colorectal cancer screening: Type of screening: Cologuard. Completed 10/01/21. Repeat every 3 years  Mammogram status: Completed 01/17/23. Repeat every year. Patient states repeat every 2 years  Lung Cancer Screening: (Low Dose CT Chest recommended if Age 71-80 years, 20 pack-year currently smoking OR have quit w/in 15years.) does qualify.   Lung Cancer Screening Referral: last done 03/31/23. Order placed 04/02/23  Additional Screening:  Hepatitis C Screening: does not qualify; Completed 07/12/20  Vision Screening: Recommended annual ophthalmology exams for early detection of glaucoma and other disorders of the eye.  Dental Screening: Recommended annual dental exams for proper oral hygiene   Community Resource Referral / Chronic Care  Management: CRR required this visit?  No   CCM required this visit?  No     Plan:     I have personally reviewed and noted the following in the patient's chart:   Medical and social history Use of alcohol, tobacco or illicit drugs  Current medications and supplements including opioid prescriptions. Patient is not currently taking opioid prescriptions. Functional ability and status Nutritional status Physical activity Advanced directives List of other physicians Hospitalizations, surgeries, and ER visits in previous 12 months Vitals Screenings to include cognitive, depression, and falls Referrals and appointments  In addition, I have reviewed and discussed with patient certain preventive protocols, quality metrics, and best practice recommendations. A written personalized care plan for preventive services as well as general preventive health recommendations were provided to patient.     Tora Kindred, CMA   10/14/2023   After Visit Summary: (MyChart) Due to this being a telephonic visit, the after visit summary with patients personalized plan was offered to patient via MyChart   Nurse Notes:  6 CIT Score - 12 Declined covid and shingles vaccines Included list of eye doctors in [place]

## 2023-10-14 NOTE — Patient Instructions (Addendum)
Lori Rangel , Thank you for taking time to come for your Medicare Wellness Visit. I appreciate your ongoing commitment to your health goals. Please review the following plan we discussed and let me know if I can assist you in the future.   Referrals/Orders/Follow-Ups/Clinician Recommendations: I have included a list of eye doctors in the area. Call and schedule an eye exam at your earliest convenience.  This is a list of the screening recommended for you and due dates:  Health Maintenance  Topic Date Due   COVID-19 Vaccine (4 - 2024-25 season) 05/04/2023   Zoster (Shingles) Vaccine (1 of 2) 12/20/2023*   Pap with HPV screening  09/20/2024*   Screening for Lung Cancer  03/30/2024   Cologuard (Stool DNA test)  10/01/2024   Medicare Annual Wellness Visit  10/13/2024   Mammogram  01/16/2025   DTaP/Tdap/Td vaccine (2 - Tdap) 09/20/2032   Pneumococcal Vaccination  Completed   Flu Shot  Completed   Hepatitis C Screening  Completed   HIV Screening  Completed   HPV Vaccine  Aged Out  *Topic was postponed. The date shown is not the original due date.    Advanced directives: (ACP Link)Information on Advanced Care Planning can be found at [facility] Advance Health Care Directives Advance Health Care Directives ([email or web address] You may also get these forms at your doctor's office.  Once you have completed the forms, please bring a copy of your health care power of attorney and living will to the office to be added to your chart at your convenience.   Next Medicare Annual Wellness Visit scheduled for next year: Yes, 10/19/24 @ 1:10pm (phone visit)  There are several Eye Doctors in your area. Here are a few that usually accept all insurance types:  [facility] [address] [place], [place] [address] Phone: [phone]  [facility] [address] [place], [place] [address] Phone: [phone]  [facility] [address] [place], [place] [address] Phone:  [phone]  [facility] [address] [place], [place] [address] Phone: [phone]  [facility] [address] [place], [place] [address] Phone: [phone]  [facility] [address] [place], [place] [address] Phone: [phone]  Please let us know if you require a referral for an eye exam appointment. Thank you!

## 2023-10-22 ENCOUNTER — Ambulatory Visit: Payer: Medicare Other

## 2023-10-30 ENCOUNTER — Ambulatory Visit
Admission: RE | Admit: 2023-10-30 | Discharge: 2023-10-30 | Disposition: A | Discharge status: Home / Self Care | Payer: Medicare Other | Source: Ambulatory Visit | Attending: Radiation Oncology | Admitting: Radiation Oncology

## 2023-10-30 DIAGNOSIS — R911 Solitary pulmonary nodule: Secondary | ICD-10-CM | POA: Insufficient documentation

## 2023-10-30 MED ORDER — IOHEXOL 300 MG/ML  SOLN
75.0000 mL | Freq: Once | INTRAMUSCULAR | Status: AC | PRN
  Administered 2023-10-30: 75 mL via INTRAVENOUS

## 2023-11-03 ENCOUNTER — Encounter: Payer: Self-pay | Admitting: Radiation Oncology

## 2023-11-03 ENCOUNTER — Ambulatory Visit
Admission: RE | Admit: 2023-11-03 | Discharge: 2023-11-03 | Disposition: A | Discharge status: Home / Self Care | Payer: Medicare Other | Source: Ambulatory Visit | Attending: Radiation Oncology | Admitting: Radiation Oncology

## 2023-11-03 VITALS — BP 141/76 | HR 75 | Temp 98.5°F | Resp 16 | Ht 62.0 in | Wt 110.4 lb

## 2023-11-03 DIAGNOSIS — C3431 Malignant neoplasm of lower lobe, right bronchus or lung: Secondary | ICD-10-CM | POA: Diagnosis present

## 2023-11-03 DIAGNOSIS — R911 Solitary pulmonary nodule: Secondary | ICD-10-CM

## 2023-11-03 DIAGNOSIS — Z923 Personal history of irradiation: Secondary | ICD-10-CM | POA: Diagnosis not present

## 2023-11-03 DIAGNOSIS — Z85118 Personal history of other malignant neoplasm of bronchus and lung: Secondary | ICD-10-CM | POA: Insufficient documentation

## 2023-11-03 NOTE — Progress Notes (Signed)
 Radiation Oncology Follow up Note  Name: Lori Rangel   Date:   11/03/2023 MRN:  [id number] DOB: 05/13/1961    This 63 y.o. 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  REFERRING PROVIDER: Marjie Skiff, NP  HPI: Patient is a 63 year old female now out 13 months having completed SBRT to her right lower lobe for stage I non-small cell lung cancer.  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  Seen today in routine follow-up she is doing well specifically [place] cough hemoptysis or chest tightness.  She recently had a CT scan of the chest.Which I have reviewed showing no evidence of recurrent or metastatic disease chest is clear she does have emphysema.  COMPLICATIONS OF TREATMENT: none  FOLLOW UP COMPLIANCE: keeps appointments   PHYSICAL EXAM:  BP (!) 141/76   Pulse 75   Temp 98.5 F (36.9 C) (Tympanic)   Resp 16   Ht 5\' 2"  (1.575 m)   Wt 110 lb 6.4 oz (50.1 kg)   BMI 20.19 kg/m  Well-developed well-nourished patient in NAD. HEENT reveals PERLA, EOMI, discs not visualized.  Oral cavity is clear. No oral mucosal lesions are identified. Neck is clear without evidence of cervical or supraclavicular adenopathy. Lungs are clear to A&P. Cardiac examination is essentially unremarkable with regular rate and rhythm without murmur rub or thrill. Abdomen is benign with no organomegaly or masses noted. Motor sensory and DTR levels are equal and symmetric in the upper and lower extremities. Cranial nerves II through XII are grossly intact. Proprioception is intact. No peripheral adenopathy or edema is identified. No motor or sensory levels are noted. Crude visual fields are within normal range.  RADIOLOGY RESULTS: CT scan reviewed compatible with above-stated  findings  PLAN: Present time patient is doing well with no evidence of disease now out 13 months having completed SBRT.  I am pleased with her overall progress and low side effect profile.  I have asked to see her back in 1 year for follow-up with repeat CT scan at that time.  Patient knows to call with any concerns at any time.  I would like to take this opportunity to thank you for allowing me to participate in the care of your patient.Carmina Miller, MD

## 2023-11-11 ENCOUNTER — Other Ambulatory Visit: Payer: Self-pay | Admitting: Pulmonary Disease

## 2023-11-14 ENCOUNTER — Ambulatory Visit: Payer: Self-pay | Admitting: Nurse Practitioner

## 2023-11-14 DIAGNOSIS — Z85118 Personal history of other malignant neoplasm of bronchus and lung: Secondary | ICD-10-CM

## 2023-11-14 DIAGNOSIS — K219 Gastro-esophageal reflux disease without esophagitis: Secondary | ICD-10-CM

## 2023-11-14 DIAGNOSIS — D508 Other iron deficiency anemias: Secondary | ICD-10-CM

## 2023-11-14 DIAGNOSIS — E440 Moderate protein-calorie malnutrition: Secondary | ICD-10-CM

## 2023-11-14 DIAGNOSIS — E039 Hypothyroidism, unspecified: Secondary | ICD-10-CM

## 2023-11-14 DIAGNOSIS — I700 Atherosclerosis of aorta: Secondary | ICD-10-CM

## 2023-11-14 DIAGNOSIS — I6523 Occlusion and stenosis of bilateral carotid arteries: Secondary | ICD-10-CM

## 2023-11-14 DIAGNOSIS — J432 Centrilobular emphysema: Secondary | ICD-10-CM

## 2023-11-14 DIAGNOSIS — E782 Mixed hyperlipidemia: Secondary | ICD-10-CM

## 2023-11-22 ENCOUNTER — Other Ambulatory Visit: Payer: Self-pay | Admitting: Nurse Practitioner

## 2023-11-24 NOTE — Telephone Encounter (Signed)
 D/C 03/22/23. Requested Prescriptions  Refused Prescriptions Disp Refills   rosuvastatin (CRESTOR) 10 MG tablet [Pharmacy Med Name: ROSUVASTATIN CALCIUM 10 MG TAB] 90 tablet 4    Sig: TAKE ONE TABLET BY MOUTH DAILY     Cardiovascular:  Antilipid - Statins 2 Failed - 11/24/2023  3:27 PM      Failed - Lipid Panel in normal range within the last 12 months    Cholesterol, Total  Date Value Ref Range Status  03/21/2023 164 100 - 199 mg/dL Final   LDL Chol Calc (NIH)  Date Value Ref Range Status  03/21/2023 76 0 - 99 mg/dL Final   HDL  Date Value Ref Range Status  03/21/2023 51 >39 mg/dL Final   Triglycerides  Date Value Ref Range Status  03/21/2023 225 (H) 0 - 149 mg/dL Final         Passed - Cr in normal range and within 360 days    Creatinine  Date Value Ref Range Status  09/20/2022 171.4 20.0 - 300.0 mg/dL Final   Creatinine, Ser  Date Value Ref Range Status  03/21/2023 0.72 0.57 - 1.00 mg/dL Final         Passed - Patient is not pregnant      Passed - Valid encounter within last 12 months    Recent Outpatient Visits           8 months ago Centrilobular emphysema (HCC)   [place] [facility] [place], Corrie Dandy T, NP   11 months ago Acute bilateral thoracic back pain   [place] [facility] [place], Corrie Dandy T, NP   1 year ago Centrilobular emphysema (HCC)   [place] [facility] [place], Corrie Dandy T, NP   1 year ago Centrilobular emphysema (HCC)   [place] [facility] [place], Corrie Dandy T, NP   2 years ago Centrilobular emphysema [facility])   [place] [facility] Marjie Skiff, NP       Future Appointments             In 1 week Salena Saner, MD [facility] [place] [facility] [place]   In 2 months [place], Dorie Rank, NP [place] [facility]

## 2023-12-04 ENCOUNTER — Ambulatory Visit: Payer: Medicare Other | Admitting: Pulmonary Disease

## 2023-12-04 ENCOUNTER — Encounter: Payer: Self-pay | Admitting: Pulmonary Disease

## 2023-12-04 VITALS — BP 122/64 | HR 73 | Temp 97.8°F | Ht 62.0 in | Wt 108.2 lb

## 2023-12-04 DIAGNOSIS — Z87891 Personal history of nicotine dependence: Secondary | ICD-10-CM

## 2023-12-04 DIAGNOSIS — J449 Chronic obstructive pulmonary disease, unspecified: Secondary | ICD-10-CM

## 2023-12-04 DIAGNOSIS — Z85118 Personal history of other malignant neoplasm of bronchus and lung: Secondary | ICD-10-CM

## 2023-12-04 NOTE — Patient Instructions (Addendum)
 VISIT SUMMARY:  You had a follow-up appointment today to check on your COPD and history of lung cancer. Your breathing is currently great, and you are not experiencing any respiratory symptoms. You are continuing to use your Stiolto inhaler effectively and remain active with activities like gardening. Your recent CT scan for lung cancer was clear, and your follow-up interval has been extended to a year.  YOUR PLAN:  -CHRONIC OBSTRUCTIVE PULMONARY DISEASE (COPD): COPD is a chronic lung condition that makes it hard to breathe. Your COPD is well-managed with your current treatment. Continue using your Stiolto inhaler as prescribed and stay active with activities like gardening. Report any new symptoms such as cough, sputum production, or signs of infection like bronchitis or pneumonia.  -LUNG CANCER: Your recent CT scan for lung cancer was clear, and your follow-up interval has been extended to a year. Monitor for any respiratory symptoms or changes and ensure you complete your follow-up CT scan as scheduled in a year.  INSTRUCTIONS:  Please ensure you complete your follow-up CT scan for lung cancer as scheduled in a year. Continue using your Stiolto inhaler as prescribed and stay active with activities like gardening. Report any new symptoms such as cough, sputum production, or signs of infection like bronchitis or pneumonia. We will see you in follow-up in 6 months time otherwise.

## 2023-12-04 NOTE — Progress Notes (Signed)
 Subjective:    Patient ID: Lori Rangel, female    DOB: 15-Jun-1961, 63 y.o.   MRN: [id number]  Patient Care Team: Lemar Pyles, NP as PCP - General (Nurse Practitioner) Devorah Fonder, MD as Consulting Physician (Cardiology) Glenis Langdon, MD as Consulting Physician (Radiation Oncology) Marc Senior, MD as Consulting Physician (Pulmonary Disease)  Chief Complaint  Patient presents with   Follow-up    No cough, shortness of breath or wheezing.     BACKGROUND/INTERVAL:Patient is a 63 year old former smoker (100 PY) with a history as noted below, who presents for follow-up of a right lung nodule and severe COPD.  Recall that in 2017 the patient developed a left upper lobe nodule, she underwent electromagnetic navigational bronchoscopy at [facility] by Dr. Auston Left and this was nondiagnostic.  Subsequently she was referred to thoracic surgery at [facility] where she underwent evaluation with percutaneous needle biopsy which resulted in pneumothorax.  She subsequently underwent referral to radiation oncology as she was not an operative candidate.  Per the family's information, the patient did have recurrence after initial radiation and she required a second round of radiation to the left lung.  That left lung has significant volume loss due to postradiation changes. [name] primary care practitioner Jolene Cannady, NP ordered a CT scan of the chest performed on 28 March 2022, which has shown a nodule on the right lung that is 12 mm in diameter.  The patient then underwent PET/CT on 12 April 2022 which showed FDG activity on the nodule in question.  There was no other activity noted elsewhere.  Because she was not a surgical candidate nor candidate for invasive procedures.  She was referred to Dr. Glenis Langdon for SBRT and she underwent 5 treatments of SBRT to this nodule.  She has had a follow-up CT chest on 24 September 2022 shows near complete resolution of the nodule in the right lower lobe,  stable mild nodularity of the pleura continues with areas of scarring on the left chest.  Mild fullness of a left axillary lymph node.  She was last seen here on 25 August 2023 follows here mostly for issues with COPD.  This is a routine follow-up visit.   HPI Discussed the use of AI scribe software for clinical note transcription with the patient, who gave verbal consent to proceed.  History of Present Illness   Lori Rangel is a 63 year old female with COPD and prior lung cancer who presents for follow-up.  She is here for a follow-up regarding her COPD. Her breathing is currently 'great' with no respiratory symptoms such as cough or sputum production. She is not smoking and continues to use her Stiolto inhaler, which she finds effective. She experiences occasional wheezing, attributed to high pollen levels, which resolves after a few deep breaths.  Regarding her history of lung cancer, she had a CT scan on 30 October 2023 that showed no evidence of recurrent or metastatic disease, this has led to an extension of her follow-up interval to a year.   She remains active, engaging in activities such as gardening, which she enjoys and finds beneficial for her health.      Review of Systems A 10 point review of systems was performed and it is as noted above otherwise negative.   Patient Active Problem List   Diagnosis Date Noted   GERD without esophagitis 09/19/2021   Mixed hyperlipidemia 03/09/2021   Iron deficiency anemia secondary to inadequate dietary iron intake  07/12/2020   Hypothyroidism 07/12/2020   Medication monitoring encounter 07/12/2020   History of lung cancer 07/07/2020   History of alcohol abuse 10/09/2018   Uses walker 06/03/2018   History of fracture of right hip 11/03/2017   Thoracic aortic atherosclerosis (HCC) 06/07/2016   COPD with emphysema (HCC) 01/25/2016   Carotid stenosis 12/31/2015   History of cigarette smoking 12/31/2015   Right lower lobe pulmonary  nodule 12/31/2015   Moderate protein-calorie malnutrition (HCC) 12/17/2015    Social History   Tobacco Use   Smoking status: Former    Current packs/day: 0.00    Average packs/day: 2.0 packs/day for 50.0 years (100.0 ttl pk-yrs)    Types: Cigarettes    Start date: 04/03/1972    Quit date: 04/03/2022    Years since quitting: 1.7   Smokeless tobacco: Never   Tobacco comments:    Quit on 04/03/2022  Substance Use Topics   Alcohol use: Not Currently    Alcohol/week: 0.0 standard drinks of alcohol    Allergies  Allergen Reactions   Levaquin [Levofloxacin] Nausea Only    Cramps & Body aches    Current Meds  Medication Sig   acetaminophen (TYLENOL) 500 MG tablet Take 500-1,000 mg by mouth every 8 (eight) hours as needed for mild constipation or moderate pain.   aspirin 81 MG EC tablet Take 1 tablet by mouth daily.   cholecalciferol (VITAMIN D3) 25 MCG (1000 UNIT) tablet Take 1,000 Units by mouth daily.   Iron-Vitamin C 65-125 MG TABS Take 1 tablet by mouth in the morning and at bedtime.   levothyroxine (SYNTHROID) 25 MCG tablet Take 1 tablet (25 mcg total) by mouth daily before breakfast.   mirtazapine (REMERON) 15 MG tablet TAKE ONE TABLET BY MOUTH AT BEDTIME   Multiple Vitamin (MULTIVITAMIN WITH MINERALS) TABS tablet Take 1 tablet by mouth daily.   rosuvastatin (CRESTOR) 20 MG tablet Take 1 tablet (20 mg total) by mouth daily.   STIOLTO RESPIMAT 2.5-2.5 MCG/ACT AERS INHALE TWO PUFFS INTO THE LUNGS EVERY DAY   VENTOLIN HFA 108 (90 Base) MCG/ACT inhaler INHALE TWO PUFFS EVERY SIX HOURS AS NEEDED FOR WHEEZING OR SHORTNESS OF BREATH   [DISCONTINUED] omeprazole (PRILOSEC) 20 MG capsule Take 1 capsule (20 mg total) by mouth daily.    Immunization History  Administered Date(s) Administered   Influenza Inj Mdck Quad Pf 06/03/2018   Influenza Split 07/04/2015   Influenza,inj,Quad PF,6+ Mos 06/03/2018, 06/15/2019   Influenza-Unspecified 06/06/2016, 06/21/2020, 07/18/2021, 08/22/2022,  07/26/2023   Moderna Sars-Covid-2 Vaccination 05/27/2020, 06/24/2020   PNEUMOCOCCAL CONJUGATE-20 08/25/2023   Pfizer Covid-19 Vaccine Bivalent Booster 65yrs & up 12/23/2020   Pneumococcal Polysaccharide-23 09/08/2020   Td 09/20/2022        Objective:     BP 122/64 (BP Location: Right Arm, Patient Position: Sitting, Cuff Size: Normal)   Pulse 73   Temp 97.8 F (36.6 C) (Temporal)   Ht 5\' 2"  (1.575 m)   Wt 108 lb 3.2 oz (49.1 kg)   SpO2 95%   BMI 19.79 kg/m   SpO2: 95 %  GENERAL: Debilitated, frail-appearing woman, looks markedly older than stated age, no acute distress, ambulates with walker. HEAD: Normocephalic, atraumatic.  EYES: Pupils equal, round, reactive to light.  No scleral icterus.  MOUTH: Poor dentition missing teeth lower, teeth in poor repair upper, pharynx clear. NECK: Supple. No thyromegaly. Trachea midline. No JVD.  No adenopathy. PULMONARY: Distant breath sounds bilaterally.  Faint end expiratory wheezes cleared with deep inspiration.  No rhonchi. CARDIOVASCULAR:  S1 and S2. Regular rate and rhythm.  ABDOMEN: Benign. MUSCULOSKELETAL: No joint deformity, no clubbing, no edema.  NEUROLOGIC: Unsteady gait, requires walker.  No overt localizing finding. SKIN: Intact,warm,dry. PSYCH: Mood and behavior normal.    Assessment & Plan:     ICD-10-CM   1. Stage 3 severe COPD by GOLD classification (HCC)  J44.9     2. History of lung cancer - Squamous cell LUL  Z85.118     3. Former smoker  Z87.891      Discussion:    Chronic Obstructive Pulmonary Disease (COPD) COPD is well-managed with current treatment. She reports excellent breathing and actively engages in gardening, which supports lung function. A slight wheeze was present but resolved after a few breaths,. She remains a non-smoker, which is crucial for COPD management. Staying active is emphasized to maintain lung function, with pulmonary rehabilitation as an alternative if needed. - Continue Stiolto  inhaler as prescribed - Encourage staying active with activities like gardening - Advise to report symptoms such as cough, sputum production, or signs of infection like bronchitis or pneumonia  Lung Cancer Her last CT scan was clear, and follow-up with the oncologist is extended to a year, indicating stability. The next CT scan is scheduled for a year from now. - Monitor for respiratory symptoms or changes - Ensure follow-up CT scan is completed as scheduled in a year      Advised if symptoms do not improve or worsen, to please contact office for sooner follow up or seek emergency care.    I spent 22 minutes of dedicated to the care of this patient on the date of this encounter to include pre-visit review of records, face-to-face time with the patient discussing conditions above, post visit ordering of testing, clinical documentation with the electronic health record, making appropriate referrals as documented, and communicating necessary findings to members of the patients care team.     C. Chloe Counter, MD Advanced Bronchoscopy PCCM [facility]    *This note was generated using voice recognition software/Dragon and/or AI transcription program.  Despite best efforts to proofread, errors can occur which can change the meaning. Any transcriptional errors that result from this process are unintentional and may not be fully corrected at the time of dictation.

## 2023-12-06 ENCOUNTER — Other Ambulatory Visit: Payer: Self-pay | Admitting: Nurse Practitioner

## 2023-12-08 ENCOUNTER — Other Ambulatory Visit: Payer: Self-pay | Admitting: Nurse Practitioner

## 2023-12-08 NOTE — Telephone Encounter (Signed)
 Requested Prescriptions  Refused Prescriptions Disp Refills   rosuvastatin (CRESTOR) 10 MG tablet [Pharmacy Med Name: ROSUVASTATIN CALCIUM 10 MG TAB] 90 tablet 4    Sig: TAKE ONE TABLET BY MOUTH DAILY     Cardiovascular:  Antilipid - Statins 2 Failed - 12/08/2023  1:34 PM      Failed - Lipid Panel in normal range within the last 12 months    Cholesterol, Total  Date Value Ref Range Status  03/21/2023 164 100 - 199 mg/dL Final   LDL Chol Calc (NIH)  Date Value Ref Range Status  03/21/2023 76 0 - 99 mg/dL Final   HDL  Date Value Ref Range Status  03/21/2023 51 >39 mg/dL Final   Triglycerides  Date Value Ref Range Status  03/21/2023 225 (H) 0 - 149 mg/dL Final         Passed - Cr in normal range and within 360 days    Creatinine  Date Value Ref Range Status  09/20/2022 171.4 20.0 - 300.0 mg/dL Final   Creatinine, Ser  Date Value Ref Range Status  03/21/2023 0.72 0.57 - 1.00 mg/dL Final         Passed - Patient is not pregnant      Passed - Valid encounter within last 12 months    Recent Outpatient Visits   None     Future Appointments             In 1 month Cannady, Dorie Rank, NP [facility]

## 2023-12-09 NOTE — Telephone Encounter (Signed)
 Unable to refill per protocol, Rx expired. Discontinued 03/22/23, dose change.  Requested Prescriptions  Pending Prescriptions Disp Refills   rosuvastatin (CRESTOR) 10 MG tablet [Pharmacy Med Name: ROSUVASTATIN CALCIUM 10 MG TAB] 90 tablet 4    Sig: TAKE ONE TABLET BY MOUTH DAILY     Cardiovascular:  Antilipid - Statins 2 Failed - 12/09/2023  2:56 PM      Failed - Lipid Panel in normal range within the last 12 months    Cholesterol, Total  Date Value Ref Range Status  03/21/2023 164 100 - 199 mg/dL Final   LDL Chol Calc (NIH)  Date Value Ref Range Status  03/21/2023 76 0 - 99 mg/dL Final   HDL  Date Value Ref Range Status  03/21/2023 51 >39 mg/dL Final   Triglycerides  Date Value Ref Range Status  03/21/2023 225 (H) 0 - 149 mg/dL Final         Passed - Cr in normal range and within 360 days    Creatinine  Date Value Ref Range Status  09/20/2022 171.4 20.0 - 300.0 mg/dL Final   Creatinine, Ser  Date Value Ref Range Status  03/21/2023 0.72 0.57 - 1.00 mg/dL Final         Passed - Patient is not pregnant      Passed - Valid encounter within last 12 months    Recent Outpatient Visits   None     Future Appointments             In 1 month Cannady, Dorie Rank, NP [facility], [facility]

## 2023-12-13 ENCOUNTER — Other Ambulatory Visit: Payer: Self-pay | Admitting: Nurse Practitioner

## 2023-12-15 NOTE — Telephone Encounter (Signed)
 Requested Prescriptions  Pending Prescriptions Disp Refills   omeprazole (PRILOSEC) 20 MG capsule [Pharmacy Med Name: OMEPRAZOLE DR 20 MG CAP] 30 capsule 0    Sig: TAKE 1 CAPSULE BY MOUTH DAILY     Gastroenterology: Proton Pump Inhibitors Passed - 12/15/2023  2:37 PM      Passed - Valid encounter within last 12 months    Recent Outpatient Visits   None     Future Appointments             In 1 month Cannady, Lavelle Posey, NP [facility], [facility]

## 2023-12-17 ENCOUNTER — Ambulatory Visit (INDEPENDENT_AMBULATORY_CARE_PROVIDER_SITE_OTHER): Admitting: Nurse Practitioner

## 2023-12-17 ENCOUNTER — Encounter: Payer: Self-pay | Admitting: Nurse Practitioner

## 2023-12-17 VITALS — BP 116/65 | HR 71 | Temp 98.0°F | Ht 59.0 in | Wt 107.4 lb

## 2023-12-17 DIAGNOSIS — R1084 Generalized abdominal pain: Secondary | ICD-10-CM | POA: Insufficient documentation

## 2023-12-17 DIAGNOSIS — E039 Hypothyroidism, unspecified: Secondary | ICD-10-CM | POA: Diagnosis not present

## 2023-12-17 MED ORDER — ROSUVASTATIN CALCIUM 20 MG PO TABS: 20.0000 mg | ORAL_TABLET | Freq: Every day | ORAL | 4 refills | Status: AC

## 2023-12-17 MED ORDER — OMEPRAZOLE 40 MG PO CPDR: 40.0000 mg | DELAYED_RELEASE_CAPSULE | Freq: Every day | ORAL | 3 refills | Status: AC

## 2023-12-17 NOTE — Patient Instructions (Signed)
 Abdominal Pain, Adult    Many things can cause belly (abdominal) pain. In most cases, belly pain is not a serious problem and can be watched and treated at home. But in some cases, it can be serious.  Your doctor will try to find the cause of your belly pain.  Follow these instructions at home:  Medicines  Take over-the-counter and prescription medicines only as told by your doctor.  Do not take medicines that help you poop (laxatives) unless told by your doctor.  General instructions  Watch your belly pain for any changes. Tell your doctor if the pain gets worse.  Drink enough fluid to keep your pee (urine) pale yellow.  Contact a doctor if:  Your belly pain changes or gets worse.  You have very bad cramping or bloating in your belly.  You vomit.  Your pain gets worse with meals, after eating, or with certain foods.  You have trouble pooping or have watery poop for more than 2-3 days.  You are not hungry, or you lose weight without trying.  You have signs of not getting enough fluid or water (dehydration). These may include:  Dark pee, very Chastain pee, or no pee.  Cracked lips or dry mouth.  Feeling sleepy or weak.  You have pain when you pee or poop.  Your belly pain wakes you up at night.  You have blood in your pee.  You have a fever.  Get help right away if:  You cannot stop vomiting.  Your pain is only in one part of your belly, like on the right side.  You have bloody or black poop, or poop that looks like tar.  You have trouble breathing.  You have chest pain.  These symptoms may be an emergency. Get help right away. Call 911.  Do not wait to see if the symptoms will go away.  Do not drive yourself to the hospital.  This information is not intended to replace advice given to you by your health care provider. Make sure you discuss any questions you have with your health care provider.  Document Revised: 06/05/2022 Document Reviewed: 06/05/2022  Elsevier Patient Education  2024 [employer].

## 2023-12-17 NOTE — Assessment & Plan Note (Signed)
 Acute started 12/08/23, but now improving.  Mild epigastric tenderness on exam.  Will increase Prilosec to 40 MG daily for 30 days and then reduce back to 20 MG.  Labs: CBC, CMP, TSH, Amylase, Lipase, UA.  Recommend she slowly transition to regular diet.  Will determine if need for imaging after labs return.  No red flags on exam.

## 2023-12-17 NOTE — Progress Notes (Signed)
 BP 116/65 (BP Location: Left Arm, Cuff Size: Small)   Pulse 71   Temp 98 F (36.7 C) (Oral)   Ht 4\' 11"  (1.499 m)   Wt 107 lb 6.4 oz (48.7 kg)   SpO2 94%   BMI 21.69 kg/m    Subjective:    Patient ID: Lori Rangel, female    DOB: [birth year], 63 y.o.   MRN: [id number]  HPI: Lori Rangel is a 63 y.o. female  Chief Complaint  Patient presents with   Emesis    Patient states she started having bad stomach pains and vomiting that started last Monday. States she vomited about 3 times. States she felt the pains up until this Monday. States she has not felt any pains or had any vomiting since this Monday.    ABDOMINAL PAIN  Presents for stomach pain that started on 12/08/23.  Had emesis, but this was after taking a dose of TUMS.  Took Nurelief and then threw this up, was dark in color but had just ate chocolate.  Since this Monday has been feeling better, when stopped it just stopped.  Was not around anyone that was sick that she knows of.  When was having pain she ate less food, but is slowly returning to regular diet as at times feels like it might start hurting again.  Continues on Omeprazole and ASA. Still has a gall bladder.  No Ibuprofen use at home. Has well water at home.  No one else in house got sick.  No bloating or belching. Duration:days Onset: sudden Severity: 3/10 initially and then went to 10/10 - one night kept her awake Quality: dull, aching, and shooting Location: from epigastric area to liver area  Episode duration:  Radiation: no Frequency: constant for 2-3 hours and then would ease off and return Alleviating factors: nothing Aggravating factors: nothing Status: better Treatments attempted: antacids and PPI Fever: no Nausea: yes Vomiting: yes Weight loss: no Decreased appetite: yes slowly improving Diarrhea: yes on the 1st day, then no more diarrhea Constipation: no -- twice a day BM and no straining Blood in stool: no Heartburn: no Jaundice:  no Rash: no Dysuria/urinary frequency: no Hematuria: no History of sexually transmitted disease: no  Relevant past medical, surgical, family and social history reviewed and updated as indicated. Interim medical history since our last visit reviewed. Allergies and medications reviewed and updated.  Review of Systems  Constitutional:  Positive for appetite change (improving). Negative for activity change, diaphoresis, fatigue and fever.  Respiratory:  Negative for cough, chest tightness, shortness of breath and wheezing.   Cardiovascular:  Negative for chest pain, palpitations and leg swelling.  Gastrointestinal:  Positive for abdominal pain (epigastric area, improving), diarrhea (x one episode) and nausea (improved). Negative for abdominal distention, constipation and vomiting.  Endocrine: Negative for cold intolerance and heat intolerance.  Neurological: Negative.   Psychiatric/Behavioral: Negative.     Per HPI unless specifically indicated above     Objective:    BP 116/65 (BP Location: Left Arm, Cuff Size: Small)   Pulse 71   Temp 98 F (36.7 C) (Oral)   Ht 4\' 11"  (1.499 m)   Wt 107 lb 6.4 oz (48.7 kg)   SpO2 94%   BMI 21.69 kg/m   Wt Readings from Last 3 Encounters:  12/17/23 107 lb 6.4 oz (48.7 kg)  12/04/23 108 lb 3.2 oz (49.1 kg)  11/03/23 110 lb 6.4 oz (50.1 kg)    Physical Exam Vitals and nursing note  reviewed.  Constitutional:      General: She is awake. She is not in acute distress.    Appearance: She is well-developed, well-groomed and underweight. She is not ill-appearing or toxic-appearing.  HENT:     Head: Normocephalic.     Right Ear: Hearing and external ear normal.     Left Ear: Hearing and external ear normal.  Eyes:     General: Lids are normal.        Right eye: No discharge.        Left eye: No discharge.     Conjunctiva/sclera: Conjunctivae normal.     Pupils: Pupils are equal, round, and reactive to light.  Neck:     Thyroid: No thyromegaly.      Vascular: No carotid bruit.  Cardiovascular:     Rate and Rhythm: Normal rate and regular rhythm.     Heart sounds: Normal heart sounds. No murmur heard.    No gallop.  Pulmonary:     Effort: Pulmonary effort is normal. No accessory muscle usage or respiratory distress.     Breath sounds: Normal breath sounds. No decreased breath sounds, wheezing or rhonchi.  Abdominal:     General: Bowel sounds are normal. There is no distension or abdominal bruit.     Palpations: Abdomen is soft. There is no hepatomegaly.     Tenderness: There is abdominal tenderness (very mild) in the epigastric area. There is no right CVA tenderness, left CVA tenderness or guarding.     Hernia: No hernia is present.  Musculoskeletal:     Cervical back: Normal range of motion and neck supple.     Right lower leg: No edema.     Left lower leg: No edema.  Lymphadenopathy:     Cervical: No cervical adenopathy.  Skin:    General: Skin is warm and dry.  Neurological:     Mental Status: She is alert and oriented to person, place, and time.     Deep Tendon Reflexes: Reflexes are normal and symmetric.     Reflex Scores:      Brachioradialis reflexes are 2+ on the right side and 2+ on the left side.      Patellar reflexes are 2+ on the right side and 2+ on the left side. Psychiatric:        Attention and Perception: Attention normal.        Mood and Affect: Mood normal.        Speech: Speech normal.        Behavior: Behavior normal. Behavior is cooperative.        Thought Content: Thought content normal.    Results for orders placed or performed in visit on 03/21/23  Comprehensive metabolic panel   Collection Time: 03/21/23  2:27 PM  Result Value Ref Range   Glucose 90 70 - 99 mg/dL   BUN 6 (L) 8 - 27 mg/dL   Creatinine, Ser 4.09 0.57 - 1.00 mg/dL   eGFR 94 >81 XB/JYN/8.29   BUN/Creatinine Ratio 8 (L) 12 - 28   Sodium 143 134 - 144 mmol/L   Potassium 4.2 3.5 - 5.2 mmol/L   Chloride 103 96 - 106 mmol/L    CO2 24 20 - 29 mmol/L   Calcium 9.2 8.7 - 10.3 mg/dL   Total Protein 6.9 6.0 - 8.5 g/dL   Albumin 4.5 3.9 - 4.9 g/dL   Globulin, Total 2.4 1.5 - 4.5 g/dL   Bilirubin Total 0.2 0.0 - 1.2 mg/dL  Alkaline Phosphatase 116 44 - 121 IU/L   AST 22 0 - 40 IU/L   ALT 17 0 - 32 IU/L  CBC with Differential/Platelet   Collection Time: 03/21/23  2:27 PM  Result Value Ref Range   WBC 5.2 3.4 - 10.8 x10E3/uL   RBC 3.92 3.77 - 5.28 x10E6/uL   Hemoglobin 12.0 11.1 - 15.9 g/dL   Hematocrit 16.1 09.6 - 46.6 %   MCV 94 79 - 97 fL   MCH 30.6 26.6 - 33.0 pg   MCHC 32.5 31.5 - 35.7 g/dL   RDW 04.5 40.9 - 81.1 %   Platelets 263 150 - 450 x10E3/uL   Neutrophils 56 Not Estab. %   Lymphs 37 Not Estab. %   Monocytes 6 Not Estab. %   Eos 1 Not Estab. %   Basos 0 Not Estab. %   Neutrophils Absolute 2.9 1.4 - 7.0 x10E3/uL   Lymphocytes Absolute 1.9 0.7 - 3.1 x10E3/uL   Monocytes Absolute 0.3 0.1 - 0.9 x10E3/uL   EOS (ABSOLUTE) 0.1 0.0 - 0.4 x10E3/uL   Basophils Absolute 0.0 0.0 - 0.2 x10E3/uL   Immature Granulocytes 0 Not Estab. %   Immature Grans (Abs) 0.0 0.0 - 0.1 x10E3/uL  Lipid Panel w/o Chol/HDL Ratio   Collection Time: 03/21/23  2:27 PM  Result Value Ref Range   Cholesterol, Total 164 100 - 199 mg/dL   Triglycerides 914 (H) 0 - 149 mg/dL   HDL 51 >78 mg/dL   VLDL Cholesterol Cal 37 5 - 40 mg/dL   LDL Chol Calc (NIH) 76 0 - 99 mg/dL      Assessment & Plan:   Problem List Items Addressed This Visit   None    Follow up plan: No follow-ups on file.

## 2023-12-17 NOTE — Assessment & Plan Note (Signed)
 Chronic, ongoing.  Will continue current regimen at this time and adjust as needed based on labs.  TSH and Free T4 on labs today.

## 2023-12-18 ENCOUNTER — Telehealth: Payer: Self-pay | Admitting: Nurse Practitioner

## 2023-12-18 ENCOUNTER — Ambulatory Visit
Admission: RE | Admit: 2023-12-18 | Discharge: 2023-12-18 | Disposition: A | Discharge status: Home / Self Care | Source: Ambulatory Visit | Attending: Nurse Practitioner | Admitting: Nurse Practitioner

## 2023-12-18 DIAGNOSIS — R7989 Other specified abnormal findings of blood chemistry: Secondary | ICD-10-CM | POA: Insufficient documentation

## 2023-12-18 DIAGNOSIS — R1084 Generalized abdominal pain: Secondary | ICD-10-CM | POA: Diagnosis present

## 2023-12-18 LAB — T4, FREE: Free T4: 0.92 ng/dL (ref 0.82–1.77)

## 2023-12-18 LAB — URINALYSIS, ROUTINE W REFLEX MICROSCOPIC
Bilirubin, UA: NEGATIVE
Glucose, UA: NEGATIVE
Ketones, UA: NEGATIVE
Leukocytes,UA: NEGATIVE
Nitrite, UA: NEGATIVE
Protein,UA: NEGATIVE
RBC, UA: NEGATIVE
Specific Gravity, UA: 1.02 (ref 1.005–1.030)
Urobilinogen, Ur: 0.2 mg/dL (ref 0.2–1.0)
pH, UA: 5.5 (ref 5.0–7.5)

## 2023-12-18 LAB — COMPREHENSIVE METABOLIC PANEL WITH GFR
ALT: 206 IU/L — ABNORMAL HIGH (ref 0–32)
AST: 110 IU/L — ABNORMAL HIGH (ref 0–40)
Albumin: 4.5 g/dL (ref 3.9–4.9)
Alkaline Phosphatase: 407 IU/L — ABNORMAL HIGH (ref 44–121)
BUN/Creatinine Ratio: 11 — ABNORMAL LOW (ref 12–28)
BUN: 8 mg/dL (ref 8–27)
Bilirubin Total: 0.4 mg/dL (ref 0.0–1.2)
CO2: 25 mmol/L (ref 20–29)
Calcium: 9.1 mg/dL (ref 8.7–10.3)
Chloride: 104 mmol/L (ref 96–106)
Creatinine, Ser: 0.75 mg/dL (ref 0.57–1.00)
Globulin, Total: 2.5 g/dL (ref 1.5–4.5)
Glucose: 89 mg/dL (ref 70–99)
Potassium: 3.7 mmol/L (ref 3.5–5.2)
Sodium: 144 mmol/L (ref 134–144)
Total Protein: 7 g/dL (ref 6.0–8.5)
eGFR: 89 mL/min/{1.73_m2} (ref >59)

## 2023-12-18 LAB — CBC WITH DIFFERENTIAL/PLATELET
Basophils Absolute: 0 10*3/uL (ref 0.0–0.2)
Basos: 1 %
EOS (ABSOLUTE): 0 10*3/uL (ref 0.0–0.4)
Eos: 1 %
Hematocrit: 40 % (ref 34.0–46.6)
Hemoglobin: 13 g/dL (ref 11.1–15.9)
Immature Grans (Abs): 0 10*3/uL (ref 0.0–0.1)
Immature Granulocytes: 0 %
Lymphocytes Absolute: 1.8 10*3/uL (ref 0.7–3.1)
Lymphs: 34 %
MCH: 30.7 pg (ref 26.6–33.0)
MCHC: 32.5 g/dL (ref 31.5–35.7)
MCV: 94 fL (ref 79–97)
Monocytes Absolute: 0.3 10*3/uL (ref 0.1–0.9)
Monocytes: 6 %
Neutrophils Absolute: 3.1 10*3/uL (ref 1.4–7.0)
Neutrophils: 58 %
Platelets: 269 10*3/uL (ref 150–450)
RBC: 4.24 x10E6/uL (ref 3.77–5.28)
RDW: 11.9 % (ref 11.7–15.4)
WBC: 5.3 10*3/uL (ref 3.4–10.8)

## 2023-12-18 LAB — AMYLASE: Amylase: 83 U/L (ref 31–110)

## 2023-12-18 LAB — TSH: TSH: 2 u[IU]/mL (ref 0.450–4.500)

## 2023-12-18 LAB — LIPASE: Lipase: 39 U/L (ref 14–72)

## 2023-12-18 NOTE — Telephone Encounter (Signed)
 Spoke to patient's daughter Lori Rangel, Lori Rangel on chart, and informed them of lab results from this morning.  Overall CBC, pancreas, and thyroid labs are stable. However, LFTs and alkaline phosphatase are elevated above her baseline normal levels.  Concern for gall bladder issues based on her current symptoms.  Had discussed this with patient and daughter yesterday.  Will obtain stat ultrasound liver today to further assess.  Lori Rangel reports understanding and able to verbalize plan back.  Informed her if imaging returns with abnormal findings will get patient into general surgery.  All questions answered.

## 2023-12-18 NOTE — Progress Notes (Signed)
 Refer to telephone note from 12/18/23.

## 2023-12-19 ENCOUNTER — Other Ambulatory Visit: Payer: Self-pay

## 2023-12-19 ENCOUNTER — Encounter: Payer: Self-pay | Admitting: Nurse Practitioner

## 2023-12-19 ENCOUNTER — Emergency Department
Admission: EM | Admit: 2023-12-19 | Discharge: 2023-12-19 | Disposition: A | Discharge status: Home / Self Care | Attending: Emergency Medicine | Admitting: Emergency Medicine

## 2023-12-19 ENCOUNTER — Emergency Department

## 2023-12-19 ENCOUNTER — Telehealth: Payer: Self-pay | Admitting: Nurse Practitioner

## 2023-12-19 ENCOUNTER — Encounter: Payer: Self-pay | Admitting: Emergency Medicine

## 2023-12-19 DIAGNOSIS — K8020 Calculus of gallbladder without cholecystitis without obstruction: Secondary | ICD-10-CM | POA: Diagnosis not present

## 2023-12-19 DIAGNOSIS — R109 Unspecified abdominal pain: Secondary | ICD-10-CM | POA: Diagnosis present

## 2023-12-19 DIAGNOSIS — R748 Abnormal levels of other serum enzymes: Secondary | ICD-10-CM | POA: Diagnosis not present

## 2023-12-19 DIAGNOSIS — K8000 Calculus of gallbladder with acute cholecystitis without obstruction: Secondary | ICD-10-CM

## 2023-12-19 DIAGNOSIS — R7401 Elevation of levels of liver transaminase levels: Secondary | ICD-10-CM | POA: Diagnosis not present

## 2023-12-19 LAB — CBC WITH DIFFERENTIAL/PLATELET
Abs Immature Granulocytes: 0.02 10*3/uL (ref 0.00–0.07)
Basophils Absolute: 0 10*3/uL (ref 0.0–0.1)
Basophils Relative: 1 %
Eosinophils Absolute: 0 10*3/uL (ref 0.0–0.5)
Eosinophils Relative: 1 %
HCT: 37.8 % (ref 36.0–46.0)
Hemoglobin: 12.6 g/dL (ref 12.0–15.0)
Immature Granulocytes: 0 %
Lymphocytes Relative: 37 %
Lymphs Abs: 2.1 10*3/uL (ref 0.7–4.0)
MCH: 31.8 pg (ref 26.0–34.0)
MCHC: 33.3 g/dL (ref 30.0–36.0)
MCV: 95.5 fL (ref 80.0–100.0)
Monocytes Absolute: 0.3 10*3/uL (ref 0.1–1.0)
Monocytes Relative: 5 %
Neutro Abs: 3.2 10*3/uL (ref 1.7–7.7)
Neutrophils Relative %: 56 %
Platelets: 245 10*3/uL (ref 150–400)
RBC: 3.96 MIL/uL (ref 3.87–5.11)
RDW: 13 % (ref 11.5–15.5)
WBC: 5.7 10*3/uL (ref 4.0–10.5)
nRBC: 0 % (ref 0.0–0.2)

## 2023-12-19 LAB — COMPREHENSIVE METABOLIC PANEL WITH GFR
ALT: 223 U/L — ABNORMAL HIGH (ref 0–44)
AST: 162 U/L — ABNORMAL HIGH (ref 15–41)
Albumin: 4.2 g/dL (ref 3.5–5.0)
Alkaline Phosphatase: 311 U/L — ABNORMAL HIGH (ref 38–126)
Anion gap: 14 (ref 5–15)
BUN: 10 mg/dL (ref 8–23)
CO2: 25 mmol/L (ref 22–32)
Calcium: 9.2 mg/dL (ref 8.9–10.3)
Chloride: 100 mmol/L (ref 98–111)
Creatinine, Ser: 0.71 mg/dL (ref 0.44–1.00)
GFR, Estimated: >60 mL/min (ref >60)
Glucose, Bld: 92 mg/dL (ref 70–99)
Potassium: 3.5 mmol/L (ref 3.5–5.1)
Sodium: 139 mmol/L (ref 135–145)
Total Bilirubin: 1.1 mg/dL (ref 0.0–1.2)
Total Protein: 7.2 g/dL (ref 6.5–8.1)

## 2023-12-19 LAB — PROTIME-INR
INR: 1.1 (ref 0.8–1.2)
Prothrombin Time: 14.5 s (ref 11.4–15.2)

## 2023-12-19 LAB — LIPASE, BLOOD: Lipase: 310 U/L — ABNORMAL HIGH (ref 11–51)

## 2023-12-19 MED ORDER — GADOBUTROL 1 MMOL/ML IV SOLN
5.0000 mL | Freq: Once | INTRAVENOUS | Status: AC | PRN
  Administered 2023-12-19: 5 mL via INTRAVENOUS

## 2023-12-19 NOTE — ED Provider Notes (Signed)
 [facility] Provider Note   Event Date/Time   First MD Initiated Contact with Patient 12/19/23 1943     (approximate) History  Abdominal Pain  HPI Lori Rangel is a 63 y.o. female who presents from her primary care office after complaining of intermittent abdominal pain over the last few weeks and was found to have elevated transaminases, elevated lipase, and ultrasound showing biliary dilation ROS: Patient currently denies any vision changes, tinnitus, difficulty speaking, facial droop, sore throat, chest pain, shortness of breath, abdominal pain, nausea/vomiting/diarrhea, dysuria, or weakness/numbness/paresthesias in any extremity   Physical Exam  Triage Vital Signs: ED Triage Vitals  Encounter Vitals Group     BP 12/19/23 1912 128/64     Systolic BP Percentile --      Diastolic BP Percentile --      Pulse Rate 12/19/23 1912 72     Resp 12/19/23 1912 18     Temp 12/19/23 1912 98.5 F (36.9 C)     Temp Source 12/19/23 1912 Oral     SpO2 12/19/23 1912 98 %     Weight 12/19/23 1911 107 lb (48.5 kg)     Height 12/19/23 1911 4\' 11"  (1.499 m)     Head Circumference --      Peak Flow --      Pain Score --      Pain Loc --      Pain Education --      Exclude from Growth Chart --    Most recent vital signs: Vitals:   12/19/23 2000 12/19/23 2315  BP: (!) 141/62 135/66  Pulse: 73 70  Resp: 16 16  Temp:  98.4 F (36.9 C)  SpO2: 99% 97%   General: Awake, oriented x4. CV:  Good peripheral perfusion.  Resp:  Normal effort.  Abd:  No distention.  Other:  Well-developed, well-nourished middle-aged Caucasian female resting comfortably in no acute distress ED Results / Procedures / Treatments  Labs (all labs ordered are listed, but only abnormal results are displayed) Labs Reviewed  COMPREHENSIVE METABOLIC PANEL WITH GFR - Abnormal; Notable for the following components:      Result Value   AST 162 (*)    ALT 223 (*)    Alkaline Phosphatase 311 (*)     All other components within normal limits  LIPASE, BLOOD - Abnormal; Notable for the following components:   Lipase 310 (*)    All other components within normal limits  CBC WITH DIFFERENTIAL/PLATELET  PROTIME-INR  URINALYSIS, ROUTINE W REFLEX MICROSCOPIC  HEPATITIS PANEL, ACUTE  RADIOLOGY ED MD interpretation: MRCP independently interpreted and shows cholelithiasis without gallbladder wall thickening or pericholecystic fluid.  There is no biliary ductal dilation or evidence of choledocholithiasis.  There is periportal edema -Agree with radiology assessment Official radiology report(s): MR ABDOMEN MRCP W WO CONTAST Result Date: 12/19/2023 CLINICAL DATA:  Right upper quadrant abdominal pain EXAM: MRI ABDOMEN WITHOUT AND WITH CONTRAST (INCLUDING MRCP) TECHNIQUE: Multiplanar multisequence MR imaging of the abdomen was performed both before and after the administration of intravenous contrast. Heavily T2-weighted images of the biliary and pancreatic ducts were obtained, and three-dimensional MRCP images were rendered by post processing. CONTRAST:  5mL GADAVIST  GADOBUTROL  1 MMOL/ML IV SOLN COMPARISON:  Right upper quadrant ultrasound, 12/18/2023 FINDINGS: Lower chest: No acute abnormality. Fat containing right-sided Bochdalek's hernia. Hepatobiliary: No solid liver abnormality is seen. Gallstones. No gallbladder wall thickening, pericholecystic fluid, or biliary ductal dilatation. Common bile duct measures 0.5 cm. Periportal edema. Pancreas:  Unremarkable. No pancreatic ductal dilatation or surrounding inflammatory changes. Spleen: Normal in size without significant abnormality. Adrenals/Urinary Tract: Adrenal glands are unremarkable. Kidneys are normal, without renal calculi, solid lesion, or hydronephrosis. Stomach/Bowel: Stomach is within normal limits. No evidence of bowel wall thickening, distention, or inflammatory changes. Vascular/Lymphatic: Aortic atherosclerosis. No enlarged abdominal lymph  nodes. Other: No abdominal wall hernia or abnormality. No ascites. Musculoskeletal: No acute or significant osseous findings. IMPRESSION: 1. Cholelithiasis without gallbladder wall thickening or pericholecystic fluid. 2. No biliary ductal dilatation or evidence of choledocholithiasis. 3. Periportal edema, which may be seen in the setting of hepatitis or cholangitis. Electronically Signed   By: Fredricka Jenny M.D.   On: 12/19/2023 21:31   MR 3D Recon At Scanner Result Date: 12/19/2023 CLINICAL DATA:  Right upper quadrant abdominal pain EXAM: MRI ABDOMEN WITHOUT AND WITH CONTRAST (INCLUDING MRCP) TECHNIQUE: Multiplanar multisequence MR imaging of the abdomen was performed both before and after the administration of intravenous contrast. Heavily T2-weighted images of the biliary and pancreatic ducts were obtained, and three-dimensional MRCP images were rendered by post processing. CONTRAST:  5mL GADAVIST  GADOBUTROL  1 MMOL/ML IV SOLN COMPARISON:  Right upper quadrant ultrasound, 12/18/2023 FINDINGS: Lower chest: No acute abnormality. Fat containing right-sided Bochdalek's hernia. Hepatobiliary: No solid liver abnormality is seen. Gallstones. No gallbladder wall thickening, pericholecystic fluid, or biliary ductal dilatation. Common bile duct measures 0.5 cm. Periportal edema. Pancreas: Unremarkable. No pancreatic ductal dilatation or surrounding inflammatory changes. Spleen: Normal in size without significant abnormality. Adrenals/Urinary Tract: Adrenal glands are unremarkable. Kidneys are normal, without renal calculi, solid lesion, or hydronephrosis. Stomach/Bowel: Stomach is within normal limits. No evidence of bowel wall thickening, distention, or inflammatory changes. Vascular/Lymphatic: Aortic atherosclerosis. No enlarged abdominal lymph nodes. Other: No abdominal wall hernia or abnormality. No ascites. Musculoskeletal: No acute or significant osseous findings. IMPRESSION: 1. Cholelithiasis without gallbladder  wall thickening or pericholecystic fluid. 2. No biliary ductal dilatation or evidence of choledocholithiasis. 3. Periportal edema, which may be seen in the setting of hepatitis or cholangitis. Electronically Signed   By: Fredricka Jenny M.D.   On: 12/19/2023 21:31   PROCEDURES: Critical Care performed: No Procedures MEDICATIONS ORDERED IN ED: Medications  gadobutrol  (GADAVIST ) 1 MMOL/ML injection 5 mL (5 mLs Intravenous Contrast Given 12/19/23 2046)   IMPRESSION / MDM / ASSESSMENT AND PLAN / ED COURSE  I reviewed the triage vital signs and the nursing notes.                             The patient is on the cardiac monitor to evaluate for evidence of arrhythmia and/or significant heart rate changes. Patient's presentation is most consistent with acute presentation with potential threat to life or bodily function. Presentation most consistent with biliary colic, without evidence of infection. History and exam AAA, pancreatitis, SBO, appendicitis, mesenteric ischemia, nephrolithiasis, pyelonephritis, or diverticulitis.  ED Interventions: analgesia PRN ED Workup: CBC, BMP, LFTs, Lipase, MRCP Spoke to Dr. Mamie Searles about the periportal edema as well as patient's elevated liver enzymes.  He recommended speaking to radiology regarding these results as there does not seem to be any reason patient may have ascending cholangitis given no obvious stone. Radiology states this is only a possible differential given the periportal edema and would not correlate given patient's lack of pain I spoke to patient at length about these results.  Patient continues to be pain-free at this time and agrees with plan for discharge home and follow-up with general  surgery for outpatient cholecystectomy Disposition: Refer to general surgery outpatient. Discharge home with SRP and instructions for primary care follow up within 24-48 hours.   FINAL CLINICAL IMPRESSION(S) / ED DIAGNOSES   Final diagnoses:  Transaminitis   Elevated lipase  Calculus of gallbladder without cholecystitis without obstruction   Rx / DC Orders   ED Discharge Orders     None      Note:  This document was prepared using Dragon voice recognition software and may include unintentional dictation errors.   Jammal Sarr K, MD 12/19/23 [phone]

## 2023-12-19 NOTE — Telephone Encounter (Signed)
 Spoke to Denver Flaming, patient's DPR/daughter, via telephone to review imaging results.  It is noted patient has dilated gall bladder with gallstones + developing biliary ductal dilatation with recommendation for possible MRCP.  Discussed with Amalia Badder that will place urgent referral to general surgery, if unable to get her in today to see them then recommend monitoring closely over the weekend and if her mother has any increase in N&V or abdominal pain or fever then she is to immediately go to ER.  Amalia Badder was able to verbalize this back to provider.  Urgent referral placed.

## 2023-12-19 NOTE — Progress Notes (Signed)
 Refer to telephone note dated 12/19/23.

## 2023-12-19 NOTE — ED Triage Notes (Signed)
 Patient C/O right upper abdominal pain. Patient was told by her Primary care that they would be scheduling her for a chole, and that it sounded emergent, but no one ever contacted her again. Patient states that she tried to call the surgery center but could not get in touch with anyone, and is unsure what to do at this point.

## 2023-12-20 LAB — HEPATITIS PANEL, ACUTE
HCV Ab: NONREACTIVE
Hep A IgM: NONREACTIVE
Hep B C IgM: NONREACTIVE
Hepatitis B Surface Ag: NONREACTIVE

## 2023-12-23 ENCOUNTER — Other Ambulatory Visit
Admission: RE | Admit: 2023-12-23 | Discharge: 2023-12-23 | Disposition: A | Discharge status: Home / Self Care | Source: Ambulatory Visit | Attending: General Surgery | Admitting: General Surgery

## 2023-12-23 ENCOUNTER — Encounter: Payer: Self-pay | Admitting: General Surgery

## 2023-12-23 ENCOUNTER — Telehealth: Payer: Self-pay

## 2023-12-23 ENCOUNTER — Inpatient Hospital Stay: Admit: 2023-12-23

## 2023-12-23 ENCOUNTER — Ambulatory Visit (INDEPENDENT_AMBULATORY_CARE_PROVIDER_SITE_OTHER): Admitting: General Surgery

## 2023-12-23 VITALS — BP 101/57 | HR 79 | Temp 98.8°F | Ht 61.0 in | Wt 105.8 lb

## 2023-12-23 DIAGNOSIS — K8020 Calculus of gallbladder without cholecystitis without obstruction: Secondary | ICD-10-CM

## 2023-12-23 DIAGNOSIS — R748 Abnormal levels of other serum enzymes: Secondary | ICD-10-CM

## 2023-12-23 DIAGNOSIS — J449 Chronic obstructive pulmonary disease, unspecified: Secondary | ICD-10-CM | POA: Diagnosis not present

## 2023-12-23 LAB — CBC
HCT: 36.3 % (ref 36.0–46.0)
Hemoglobin: 12.1 g/dL (ref 12.0–15.0)
MCH: 31.3 pg (ref 26.0–34.0)
MCHC: 33.3 g/dL (ref 30.0–36.0)
MCV: 94 fL (ref 80.0–100.0)
Platelets: 248 10*3/uL (ref 150–400)
RBC: 3.86 MIL/uL — ABNORMAL LOW (ref 3.87–5.11)
RDW: 12.9 % (ref 11.5–15.5)
WBC: 4.8 10*3/uL (ref 4.0–10.5)
nRBC: 0 % (ref 0.0–0.2)

## 2023-12-23 LAB — COMPREHENSIVE METABOLIC PANEL WITH GFR
ALT: 76 U/L — ABNORMAL HIGH (ref 0–44)
AST: 30 U/L (ref 15–41)
Albumin: 4.1 g/dL (ref 3.5–5.0)
Alkaline Phosphatase: 205 U/L — ABNORMAL HIGH (ref 38–126)
Anion gap: 9 (ref 5–15)
BUN: 10 mg/dL (ref 8–23)
CO2: 27 mmol/L (ref 22–32)
Calcium: 9 mg/dL (ref 8.9–10.3)
Chloride: 106 mmol/L (ref 98–111)
Creatinine, Ser: 0.73 mg/dL (ref 0.44–1.00)
GFR, Estimated: >60 mL/min (ref >60)
Glucose, Bld: 99 mg/dL (ref 70–99)
Potassium: 4 mmol/L (ref 3.5–5.1)
Sodium: 142 mmol/L (ref 135–145)
Total Bilirubin: 0.5 mg/dL (ref 0.0–1.2)
Total Protein: 7.1 g/dL (ref 6.5–8.1)

## 2023-12-23 NOTE — Patient Instructions (Addendum)
 Please go to [facility] to have labs drawn. You do NOT need an appointment.   You have requested to have your gallbladder removed. This will be done at [facility] with Dr. Cornel Diesel.  If you are on any injectable weight loss medication, you will need to stop taking your GLP-1 injectable (weight loss) medications 8 days before your surgery to avoid any complications with anesthesia.   You will most likely be out of work 1-2 weeks for this surgery.  If you have FMLA or disability paperwork that needs filled out you may drop this off at our office or this can be faxed to [phone].  You will return after your post-op appointment with a lifting restriction for approximately 4 more weeks.  You will be able to eat anything you would like to following surgery. But, start by eating a bland diet and advance this as tolerated. The Gallbladder diet is below, please go as closely by this diet as possible prior to surgery to avoid any further attacks.  Please see the (blue)pre-care form that you have been given today. Our surgery scheduler will call you to verify surgery date and to go over information.   If you have any questions, please call our office.  Laparoscopic Cholecystectomy Laparoscopic cholecystectomy is surgery to remove the gallbladder. The gallbladder is located in the upper right part of the abdomen, behind the liver. It is a storage sac for bile, which is produced in the liver. Bile aids in the digestion and absorption of fats. Cholecystectomy is often done for inflammation of the gallbladder (cholecystitis). This condition is usually caused by a buildup of gallstones (cholelithiasis) in the gallbladder. Gallstones can block the flow of bile, and that can result in inflammation and pain. In severe cases, emergency surgery may be required. If emergency surgery is not required, you will have time to prepare for the procedure. Laparoscopic surgery is an alternative to open  surgery. Laparoscopic surgery has a shorter recovery time. Your common bile duct may also need to be examined during the procedure. If stones are found in the common bile duct, they may be removed. LET [facility] CARE PROVIDER KNOW ABOUT: Any allergies you have. All medicines you are taking, including vitamins, herbs, eye drops, creams, and over-the-counter medicines. Previous problems you or members of your family have had with the use of anesthetics. Any blood disorders you have. Previous surgeries you have had.  Any medical conditions you have. RISKS AND COMPLICATIONS Generally, this is a safe procedure. However, problems may occur, including: Infection. Bleeding. Allergic reactions to medicines. Damage to other structures or organs. A stone remaining in the common bile duct. A bile leak from the cyst duct that is clipped when your gallbladder is removed. The need to convert to open surgery, which requires a larger incision in the abdomen. This may be necessary if your surgeon thinks that it is not safe to continue with a laparoscopic procedure. BEFORE THE PROCEDURE Ask your health care provider about: Changing or stopping your regular medicines. This is especially important if you are taking diabetes medicines or blood thinners. Taking medicines such as aspirin  and ibuprofen. These medicines can thin your blood. Do not take these medicines before your procedure if your health care provider instructs you not to. Follow instructions from your health care provider about eating or drinking restrictions. Let your health care provider know if you develop a cold or an infection before surgery. Plan to have someone take you home after  the procedure. Ask your health care provider how your surgical site will be marked or identified. You may be given antibiotic medicine to help prevent infection. PROCEDURE To reduce your risk of infection: Your health care team will wash or sanitize their  hands. Your skin will be washed with soap. An IV tube may be inserted into one of your veins. You will be given a medicine to make you fall asleep (general anesthetic). A breathing tube will be placed in your mouth. The surgeon will make several small cuts (incisions) in your abdomen. A thin, lighted tube (laparoscope) that has a tiny camera on the end will be inserted through one of the small incisions. The camera on the laparoscope will send a picture to a TV screen (monitor) in the operating room. This will give the surgeon a good view inside your abdomen. A gas will be pumped into your abdomen. This will expand your abdomen to give the surgeon more room to perform the surgery. Other tools that are needed for the procedure will be inserted through the other incisions. The gallbladder will be removed through one of the incisions. After your gallbladder has been removed, the incisions will be closed with stitches (sutures), staples, or skin glue. Your incisions may be covered with a bandage (dressing). The procedure may vary among health care providers and hospitals. AFTER THE PROCEDURE Your blood pressure, heart rate, breathing rate, and blood oxygen level will be monitored often until the medicines you were given have worn off. You will be given medicines as needed to control your pain.   This information is not intended to replace advice given to you by your health care provider. Make sure you discuss any questions you have with your health care provider.   Document Released: 08/19/2005 Document Revised: 05/10/2015 Document Reviewed: 03/31/2013 Elsevier Interactive Patient Education 2016 Elsevier Inc.   Low-Fat Diet for Gallbladder Conditions A low-fat diet can be helpful if you have pancreatitis or a gallbladder condition. With these conditions, your pancreas and gallbladder have trouble digesting fats. A healthy eating plan with less fat will help rest your pancreas and gallbladder and  reduce your symptoms. WHAT DO I NEED TO KNOW ABOUT THIS DIET? Eat a low-fat diet. Reduce your fat intake to less than 20-30% of your total daily calories. This is less than 50-60 g of fat per day. Remember that you need some fat in your diet. Ask your dietician what your daily goal should be. Choose nonfat and low-fat healthy foods. Look for the words "nonfat," "low fat," or "fat free." As a guide, look on the label and choose foods with less than 3 g of fat per serving. Eat only one serving. Avoid alcohol. Do not smoke. If you need help quitting, talk with your health care provider. Eat small frequent meals instead of three large heavy meals. WHAT FOODS CAN I EAT? Grains Include healthy grains and starches such as potatoes, wheat bread, fiber-rich cereal, and brown rice. Choose whole grain options whenever possible. In adults, whole grains should account for 45-65% of your daily calories.  Fruits and Vegetables Eat plenty of fruits and vegetables. Fresh fruits and vegetables add fiber to your diet. Meats and Other Protein Sources Eat lean meat such as chicken and pork. Trim any fat off of meat before cooking it. Eggs, fish, and beans are other sources of protein. In adults, these foods should account for 10-35% of your daily calories. Dairy Choose low-fat milk and dairy options. Dairy includes fat and  protein, as well as calcium .  Fats and Oils Limit high-fat foods such as fried foods, sweets, baked goods, sugary drinks.  Other Creamy sauces and condiments, such as mayonnaise, can add extra fat. Think about whether or not you need to use them, or use smaller amounts or low fat options. WHAT FOODS ARE NOT RECOMMENDED? High fat foods, such as: [employer]. Ice cream. [place] toast. Sweet rolls. Pizza. Cheese bread. Foods covered with batter, butter, creamy sauces, or cheese. Fried foods. Sugary drinks and desserts. Foods that cause gas or bloating   This information is not intended  to replace advice given to you by your health care provider. Make sure you discuss any questions you have with your health care provider.   Document Released: 08/24/2013 Document Reviewed: 08/24/2013 Elsevier Interactive Patient Education [employer]

## 2023-12-23 NOTE — Telephone Encounter (Signed)
 Faxed pulmonary clearance to Dr. Sarina Ser at [phone].

## 2023-12-24 ENCOUNTER — Telehealth: Payer: Self-pay | Admitting: General Surgery

## 2023-12-24 ENCOUNTER — Ambulatory Visit: Payer: Self-pay | Admitting: General Surgery

## 2023-12-24 DIAGNOSIS — K8050 Calculus of bile duct without cholangitis or cholecystitis without obstruction: Secondary | ICD-10-CM

## 2023-12-24 NOTE — Telephone Encounter (Signed)
 Patient has been advised of Pre-Admission date/time, and Surgery date at [facility].  Surgery Date: 01/09/24 Preadmission Testing Date: 01/01/24 (phone 8a-1p)  Patient informed of the scheduling process and surgery information is given at time of office visit.   Patient has been made aware to call [phone], between 1-3:00pm the day before surgery, to find out what time to arrive for surgery.

## 2023-12-25 ENCOUNTER — Ambulatory Visit: Payer: Self-pay | Admitting: Surgery

## 2023-12-26 ENCOUNTER — Ambulatory Visit: Admitting: Nurse Practitioner

## 2023-12-26 NOTE — Progress Notes (Signed)
 Patient ID: Lori Rangel, female   DOB: 03-Jun-1961, 63 y.o.   MRN: [id number] CC: Biliary Colic, possible choledocholithiasis History of Present Illness Lori Rangel is a 63 y.o. female with past medical history significant for COPD not on home oxygen and history of lung cancer who presents in consultation for possible choledocholithiasis.  The patient reports that she has intermittent abdominal pain over the last several weeks.  She describes the pain as in the right upper quadrant and radiating back to her back.  She had a workup that showed a transaminitis as well as an elevated lipase and an ultrasound showing biliary ductal dilation.  She then underwent an MRCP that did not show any biliary ductal dilation and did not show any evidence of cholecystitis.  She reports that her pain is intermittent and she has not having any pain on exam today.  She reports some nausea when she has these episodes denies any vomiting or diarrhea.  She denies any fevers. Of note, the patient has a history of COPD.  She reports that she can work in her garden without getting short of breath but she does follow-up with a pulmonologist regularly.   Past Medical History Past Medical History:  Diagnosis Date   Asthma    Cancer (HCC)    lung   COPD (chronic obstructive pulmonary disease) (HCC)    Severe by PFTs 2017   Shortness of breath dyspnea    Squamous cell carcinoma lung, left (HCC) 2017   Radiation therapy, patient was not surgical candidate   Thyroid disease    Weight decrease major recent weight loss       Past Surgical History:  Procedure Laterality Date   BREAST BIOPSY Right    COLONOSCOPY     ELECTROMAGNETIC NAVIGATION BROCHOSCOPY N/A 01/02/2016   Procedure: ELECTROMAGNETIC NAVIGATION BRONCHOSCOPY;  Surgeon: Cleve Dale, MD;  Location: [facility] ORS;  Service: Cardiopulmonary;  Laterality: N/A;   ESOPHAGOGASTRODUODENOSCOPY (EGD) WITH PROPOFOL  N/A 08/27/2018   Procedure: ESOPHAGOGASTRODUODENOSCOPY  (EGD) WITH PROPOFOL ;  Surgeon: Lori Sink, MD;  Location: [facility] ENDOSCOPY;  Service: Endoscopy;  Laterality: N/A;   INTRAMEDULLARY (IM) NAIL INTERTROCHANTERIC Right 11/03/2017   Procedure: INTRAMEDULLARY (IM) NAIL INTERTROCHANTRIC;  Surgeon: Lori Rota, MD;  Location: [facility] ORS;  Service: Orthopedics;  Laterality: Right;    Allergies  Allergen Reactions   Levaquin  [Levofloxacin ] Nausea Only    Cramps & Body aches    Current Outpatient Medications  Medication Sig Dispense Refill   acetaminophen  (TYLENOL ) 500 MG tablet Take 500-1,000 mg by mouth every 8 (eight) hours as needed for mild constipation or moderate pain.     aspirin  81 MG EC tablet Take 1 tablet by mouth daily.     cholecalciferol (VITAMIN D3) 25 MCG (1000 UNIT) tablet Take 1,000 Units by mouth daily.     Iron -Vitamin C  65-125 MG TABS Take 1 tablet by mouth in the morning and at bedtime. 180 tablet 4   levothyroxine  (SYNTHROID ) 25 MCG tablet Take 1 tablet (25 mcg total) by mouth daily before breakfast. 90 tablet 4   mirtazapine  (REMERON ) 15 MG tablet TAKE ONE TABLET BY MOUTH AT BEDTIME 90 tablet 0   Multiple Vitamin (MULTIVITAMIN WITH MINERALS) TABS tablet Take 1 tablet by mouth daily. 30 tablet 0   omeprazole  (PRILOSEC) 40 MG capsule Take 1 capsule (40 mg total) by mouth daily. 30 capsule 3   rosuvastatin  (CRESTOR ) 20 MG tablet Take 1 tablet (20 mg total) by mouth daily. 90 tablet 4   STIOLTO RESPIMAT   2.5-2.5 MCG/ACT AERS INHALE TWO PUFFS INTO THE LUNGS EVERY DAY 4 g 5   VENTOLIN  HFA 108 (90 Base) MCG/ACT inhaler INHALE TWO PUFFS EVERY SIX HOURS AS NEEDED FOR WHEEZING OR SHORTNESS OF BREATH 18 g 4   No current facility-administered medications for this visit.    Family History Family History  Problem Relation Age of Onset   Cirrhosis Father    Lung cancer Sister    Breast cancer Neg Hx        Social History Social History   Tobacco Use   Smoking status: Former    Current packs/day: 0.00    Average packs/day: 2.0  packs/day for 50.0 years (100.0 ttl pk-yrs)    Types: Cigarettes    Start date: 04/03/1972    Quit date: 04/03/2022    Years since quitting: 1.7   Smokeless tobacco: Never   Tobacco comments:    Quit on 04/03/2022  Substance Use Topics   Alcohol use: Not Currently    Alcohol/week: 0.0 standard drinks of alcohol   Drug use: No        ROS Full ROS of systems performed and is otherwise negative there than what is stated in the HPI  Physical Exam Blood pressure (!) 101/57, pulse 79, temperature 98.8 F (37.1 C), temperature source Oral, height 5\' 1"  (1.549 m), weight 105 lb 12.8 oz (48 kg), SpO2 98%.  Alert and oriented x 3, some wheezing in the right lung fields worse in the right lower lung, regular rate and rhythm, abdomen is soft, nontender and nondistended, moving all extremities spontaneously  Data Reviewed Reviewed her imaging consistent with cholelithiasis and on her MRI there is no evidence of biliary ductal dilation.  Her last labs are notable for a normal total bili.  I have personally reviewed the patient's imaging and medical records.    Assessment/Plan    63 year old woman with COPD and biliary colic.  She is having increased episodes of right upper quadrant pain so I think that we should proceed with resection of her gallbladder.  However, she does have COPD and I would like her to see her pulmonologist to make sure that there are no other things that need to be done.  I discussed the risk, benefits alternatives of a robotic assisted cholecystectomy including the risk of infection, bleeding, retained stone,, bile duct injury and bile leak.  She understands these risks and wishes to proceed with surgery.  I also discussed that she has an increased risk of pulmonary complications given her COPD     Lori Rangel

## 2024-01-01 ENCOUNTER — Encounter
Admission: RE | Admit: 2024-01-01 | Discharge: 2024-01-01 | Disposition: A | Discharge status: Home / Self Care | Source: Ambulatory Visit | Attending: General Surgery | Admitting: General Surgery

## 2024-01-01 ENCOUNTER — Other Ambulatory Visit: Payer: Self-pay

## 2024-01-01 DIAGNOSIS — J439 Emphysema, unspecified: Secondary | ICD-10-CM

## 2024-01-01 HISTORY — DX: Pneumonia, unspecified organism: J18.9

## 2024-01-01 HISTORY — DX: Atherosclerosis of aorta: I70.0

## 2024-01-01 HISTORY — DX: Calculus of bile duct without cholangitis or cholecystitis without obstruction: K80.50

## 2024-01-01 HISTORY — DX: Hypothyroidism, unspecified: E03.9

## 2024-01-01 HISTORY — DX: Anemia, unspecified: D64.9

## 2024-01-01 NOTE — Patient Instructions (Addendum)
 Your procedure is scheduled on: 01/09/24 - Friday Report to the Registration Desk on the 1st floor of the [facility]. To find out your arrival time, please call [phone] between 1PM - 3PM on: 01/08/24 - Thursday If your arrival time is 6:00 am, do not arrive before that time as the [facility] entrance doors do not open until 6:00 am.  REMEMBER: Instructions that are not followed completely may result in serious medical risk, up to and including death; or upon the discretion of your surgeon and anesthesiologist your surgery may need to be rescheduled.  Do not eat food or drink any liquids after midnight the night before surgery.  No gum chewing or hard candies.  One week prior to surgery: Stop Anti-inflammatories (NSAIDS) such as Advil, Aleve, Ibuprofen, Motrin, Naproxen, Naprosyn and Aspirin  based products such as Excedrin, Goody's Powder, BC Powder. You may continue to take Tylenol  if needed for pain up until the day of surgery.  Stop ANY OVER THE COUNTER supplements until after surgery : Iron -Vitamin C ,  Multiple Vitamin .  HOLD ASPIRIN  beginning 01/01/24.  ON THE DAY OF SURGERY ONLY TAKE THESE MEDICATIONS WITH SIPS OF WATER:  levothyroxine  (SYNTHROID )  omeprazole  (PRILOSEC)  STIOLTO RESPIMAT    Use inhaler VENTOLIN  HFA  on the day of surgery and bring to the hospital.   No Alcohol for 24 hours before or after surgery.  No Smoking including e-cigarettes for 24 hours before surgery.  No chewable tobacco products for at least 6 hours before surgery.  No nicotine  patches on the day of surgery.  Do not use any "recreational" drugs for at least a week (preferably 2 weeks) before your surgery.  Please be advised that the combination of cocaine and anesthesia may have negative outcomes, up to and including death. If you test positive for cocaine, your surgery will be cancelled.  On the morning of surgery brush your teeth with toothpaste and water, you may rinse your mouth  with mouthwash if you wish. Do not swallow any toothpaste or mouthwash.  Use CHG Soap or wipes as directed on instruction sheet.  Do not wear jewelry, make-up, hairpins, clips or nail polish.  For welded (permanent) jewelry: bracelets, anklets, waist bands, etc.  Please have this removed prior to surgery.  If it is not removed, there is a chance that hospital personnel will need to cut it off on the day of surgery.  Do not wear lotions, powders, or perfumes.   Do not shave body hair from the neck down 48 hours before surgery.  Contact lenses, hearing aids and dentures may not be worn into surgery.  Do not bring valuables to the hospital. [facility] is not responsible for any missing/lost belongings or valuables.   Notify your doctor if there is any change in your medical condition (cold, fever, infection).  Wear comfortable clothing (specific to your surgery type) to the hospital.  After surgery, you can help prevent lung complications by doing breathing exercises.  Take deep breaths and cough every 1-2 hours. Your doctor may order a device called an Incentive Spirometer to help you take deep breaths.  When coughing or sneezing, hold a pillow firmly against your incision with both hands. This is called "splinting." Doing this helps protect your incision. It also decreases belly discomfort.  If you are being admitted to the hospital overnight, leave your suitcase in the car. After surgery it may be brought to your room.  In case of increased patient census, it may be  necessary for you, the patient, to continue your postoperative care in the Same Day Surgery department.  If you are being discharged the day of surgery, you will not be allowed to drive home. You will need a responsible individual to drive you home and stay with you for 24 hours after surgery.   If you are taking public transportation, you will need to have a responsible individual with you.  Please call the  Pre-admissions Testing Dept. at [phone] if you have any questions about these instructions.  Surgery Visitation Policy:  Patients having surgery or a procedure may have two visitors.  Children under the age of 74 must have an adult with them who is not the patient.  Inpatient Visitation:    Visiting hours are 7 a.m. to 8 p.m. Up to four visitors are allowed at one time in a patient room. The visitors may rotate out with other people during the day.  One visitor age 70 or older may stay with the patient overnight and must be in the room by 8 p.m.    Preparing for Surgery with CHLORHEXIDINE  GLUCONATE (CHG) Soap  Chlorhexidine  Gluconate (CHG) Soap  o An antiseptic cleaner that kills germs and bonds with the skin to continue killing germs even after washing  o Used for showering the night before surgery and morning of surgery  Before surgery, you can play an important role by reducing the number of germs on your skin.  CHG (Chlorhexidine  gluconate) soap is an antiseptic cleanser which kills germs and bonds with the skin to continue killing germs even after washing.  Please do not use if you have an allergy to CHG or antibacterial soaps. If your skin becomes reddened/irritated stop using the CHG.  1. Shower the NIGHT BEFORE SURGERY and the MORNING OF SURGERY with CHG soap.  2. If you choose to wash your hair, wash your hair first as usual with your normal shampoo.  3. After shampooing, rinse your hair and body thoroughly to remove the shampoo.  4. Use CHG as you would any other liquid soap. You can apply CHG directly to the skin and wash gently with a scrungie or a clean washcloth.  5. Apply the CHG soap to your body only from the neck down. Do not use on open wounds or open sores. Avoid contact with your eyes, ears, mouth, and genitals (private parts). Wash face and genitals (private parts) with your normal soap.  6. Wash thoroughly, paying special attention to the area where  your surgery will be performed.  7. Thoroughly rinse your body with warm water.  8. Do not shower/wash with your normal soap after using and rinsing off the CHG soap.  9. Pat yourself dry with a clean towel.  10. Wear clean pajamas to bed the night before surgery.  12. Place clean sheets on your bed the night of your first shower and do not sleep with pets.  13. Shower again with the CHG soap on the day of surgery prior to arriving at the hospital.  14. Do not apply any deodorants/lotions/powders.  15. Please wear clean clothes to the hospital.

## 2024-01-02 ENCOUNTER — Encounter
Admission: RE | Admit: 2024-01-02 | Discharge: 2024-01-02 | Disposition: A | Discharge status: Home / Self Care | Source: Ambulatory Visit | Attending: General Surgery | Admitting: General Surgery

## 2024-01-02 DIAGNOSIS — J439 Emphysema, unspecified: Secondary | ICD-10-CM | POA: Diagnosis not present

## 2024-01-02 DIAGNOSIS — Z01810 Encounter for preprocedural cardiovascular examination: Secondary | ICD-10-CM | POA: Diagnosis present

## 2024-01-02 DIAGNOSIS — R9431 Abnormal electrocardiogram [ECG] [EKG]: Secondary | ICD-10-CM | POA: Diagnosis not present

## 2024-01-06 ENCOUNTER — Telehealth: Payer: Self-pay

## 2024-01-06 NOTE — Telephone Encounter (Signed)
 Received pulmonary clearance from Dr. Viva Grise. Pt's risk assessment is medium and is optimized for surgery.

## 2024-01-08 ENCOUNTER — Other Ambulatory Visit: Payer: Self-pay

## 2024-01-08 ENCOUNTER — Inpatient Hospital Stay
Admission: EM | Admit: 2024-01-08 | Discharge: 2024-01-10 | DRG: 417 | Disposition: A | Discharge status: Home / Self Care | Attending: General Surgery | Admitting: General Surgery

## 2024-01-08 ENCOUNTER — Emergency Department

## 2024-01-08 ENCOUNTER — Encounter: Payer: Self-pay | Admitting: Emergency Medicine

## 2024-01-08 DIAGNOSIS — K8013 Calculus of gallbladder with acute and chronic cholecystitis with obstruction: Principal | ICD-10-CM | POA: Diagnosis present

## 2024-01-08 DIAGNOSIS — Z79890 Hormone replacement therapy (postmenopausal): Secondary | ICD-10-CM | POA: Diagnosis not present

## 2024-01-08 DIAGNOSIS — Z79899 Other long term (current) drug therapy: Secondary | ICD-10-CM | POA: Diagnosis not present

## 2024-01-08 DIAGNOSIS — Z7982 Long term (current) use of aspirin: Secondary | ICD-10-CM

## 2024-01-08 DIAGNOSIS — K851 Biliary acute pancreatitis without necrosis or infection: Secondary | ICD-10-CM | POA: Diagnosis present

## 2024-01-08 DIAGNOSIS — J4489 Other specified chronic obstructive pulmonary disease: Secondary | ICD-10-CM | POA: Diagnosis present

## 2024-01-08 DIAGNOSIS — K812 Acute cholecystitis with chronic cholecystitis: Secondary | ICD-10-CM | POA: Diagnosis not present

## 2024-01-08 DIAGNOSIS — Z7951 Long term (current) use of inhaled steroids: Secondary | ICD-10-CM | POA: Diagnosis not present

## 2024-01-08 DIAGNOSIS — Z9049 Acquired absence of other specified parts of digestive tract: Secondary | ICD-10-CM | POA: Diagnosis present

## 2024-01-08 DIAGNOSIS — Z87891 Personal history of nicotine dependence: Secondary | ICD-10-CM | POA: Diagnosis not present

## 2024-01-08 DIAGNOSIS — Z881 Allergy status to other antibiotic agents status: Secondary | ICD-10-CM | POA: Diagnosis not present

## 2024-01-08 DIAGNOSIS — E039 Hypothyroidism, unspecified: Secondary | ICD-10-CM | POA: Diagnosis present

## 2024-01-08 LAB — COMPREHENSIVE METABOLIC PANEL WITH GFR
ALT: 502 U/L — ABNORMAL HIGH (ref 0–44)
AST: 873 U/L — ABNORMAL HIGH (ref 15–41)
Albumin: 4.2 g/dL (ref 3.5–5.0)
Alkaline Phosphatase: 174 U/L — ABNORMAL HIGH (ref 38–126)
Anion gap: 11 (ref 5–15)
BUN: 10 mg/dL (ref 8–23)
CO2: 26 mmol/L (ref 22–32)
Calcium: 9.2 mg/dL (ref 8.9–10.3)
Chloride: 100 mmol/L (ref 98–111)
Creatinine, Ser: 0.74 mg/dL (ref 0.44–1.00)
GFR, Estimated: >60 mL/min (ref >60)
Glucose, Bld: 155 mg/dL — ABNORMAL HIGH (ref 70–99)
Potassium: 4 mmol/L (ref 3.5–5.1)
Sodium: 137 mmol/L (ref 135–145)
Total Bilirubin: 1.5 mg/dL — ABNORMAL HIGH (ref 0.0–1.2)
Total Protein: 7.7 g/dL (ref 6.5–8.1)

## 2024-01-08 LAB — URINALYSIS, ROUTINE W REFLEX MICROSCOPIC
Bilirubin Urine: NEGATIVE
Glucose, UA: NEGATIVE mg/dL
Hgb urine dipstick: NEGATIVE
Ketones, ur: NEGATIVE mg/dL
Leukocytes,Ua: NEGATIVE
Nitrite: NEGATIVE
Protein, ur: NEGATIVE mg/dL
Specific Gravity, Urine: 1.013 (ref 1.005–1.030)
pH: 8 (ref 5.0–8.0)

## 2024-01-08 LAB — CBC
HCT: 40.4 % (ref 36.0–46.0)
Hemoglobin: 13.5 g/dL (ref 12.0–15.0)
MCH: 31.2 pg (ref 26.0–34.0)
MCHC: 33.4 g/dL (ref 30.0–36.0)
MCV: 93.3 fL (ref 80.0–100.0)
Platelets: 259 10*3/uL (ref 150–400)
RBC: 4.33 MIL/uL (ref 3.87–5.11)
RDW: 12.1 % (ref 11.5–15.5)
WBC: 5 10*3/uL (ref 4.0–10.5)
nRBC: 0 % (ref 0.0–0.2)

## 2024-01-08 LAB — LIPASE, BLOOD: Lipase: 1751 U/L — ABNORMAL HIGH (ref 11–51)

## 2024-01-08 MED ORDER — ENOXAPARIN SODIUM 40 MG/0.4ML IJ SOSY
40.0000 mg | PREFILLED_SYRINGE | INTRAMUSCULAR | Status: DC
  Administered 2024-01-08 – 2024-01-09 (×2): 40 mg via SUBCUTANEOUS
  Filled 2024-01-08 (×2): qty 0.4

## 2024-01-08 MED ORDER — KETOROLAC TROMETHAMINE 15 MG/ML IJ SOLN
15.0000 mg | Freq: Once | INTRAMUSCULAR | Status: AC
  Administered 2024-01-08: 15 mg via INTRAVENOUS
  Filled 2024-01-08: qty 1

## 2024-01-08 MED ORDER — LEVOTHYROXINE SODIUM 50 MCG PO TABS
25.0000 ug | ORAL_TABLET | Freq: Every day | ORAL | Status: DC
Start: 2024-01-09 — End: 2024-01-10
  Administered 2024-01-10: 25 ug via ORAL
  Filled 2024-01-08 (×2): qty 1

## 2024-01-08 MED ORDER — MIRTAZAPINE 15 MG PO TABS
15.0000 mg | ORAL_TABLET | Freq: Every day | ORAL | Status: DC
  Administered 2024-01-08 – 2024-01-09 (×2): 15 mg via ORAL
  Filled 2024-01-08 (×2): qty 1

## 2024-01-08 MED ORDER — ONDANSETRON HCL 4 MG/2ML IJ SOLN: 4.0000 mg | Freq: Four times a day (QID) | INTRAMUSCULAR | Status: DC | PRN

## 2024-01-08 MED ORDER — OXYCODONE HCL 5 MG PO TABS
5.0000 mg | ORAL_TABLET | ORAL | Status: DC | PRN
  Administered 2024-01-09 – 2024-01-10 (×2): 5 mg via ORAL
  Filled 2024-01-08 (×3): qty 1

## 2024-01-08 MED ORDER — MORPHINE SULFATE (PF) 2 MG/ML IV SOLN
2.0000 mg | Freq: Once | INTRAVENOUS | Status: AC
  Administered 2024-01-08: 2 mg via INTRAVENOUS
  Filled 2024-01-08 (×2): qty 1

## 2024-01-08 MED ORDER — SODIUM CHLORIDE 0.9 % IV SOLN: 2.0000 g | INTRAVENOUS | Status: DC

## 2024-01-08 MED ORDER — LABETALOL HCL 5 MG/ML IV SOLN
10.0000 mg | INTRAVENOUS | Status: DC | PRN
  Administered 2024-01-09: 10 mg via INTRAVENOUS
  Filled 2024-01-08: qty 4

## 2024-01-08 MED ORDER — ACETAMINOPHEN 325 MG PO TABS
650.0000 mg | ORAL_TABLET | Freq: Four times a day (QID) | ORAL | Status: DC | PRN
  Administered 2024-01-08: 650 mg via ORAL
  Filled 2024-01-08: qty 2

## 2024-01-08 MED ORDER — ARFORMOTEROL TARTRATE 15 MCG/2ML IN NEBU
15.0000 ug | INHALATION_SOLUTION | Freq: Two times a day (BID) | RESPIRATORY_TRACT | Status: DC
  Administered 2024-01-08 – 2024-01-10 (×4): 15 ug via RESPIRATORY_TRACT
  Filled 2024-01-08 (×5): qty 2

## 2024-01-08 MED ORDER — CHLORHEXIDINE GLUCONATE CLOTH 2 % EX PADS
6.0000 | MEDICATED_PAD | Freq: Once | CUTANEOUS | Status: DC
Start: 2024-01-08 — End: 2024-01-09

## 2024-01-08 MED ORDER — LACTATED RINGERS IV SOLN: INTRAVENOUS | Status: AC

## 2024-01-08 MED ORDER — SODIUM CHLORIDE 0.9 % IV BOLUS
1000.0000 mL | Freq: Once | INTRAVENOUS | Status: AC
  Administered 2024-01-08: 1000 mL via INTRAVENOUS

## 2024-01-08 MED ORDER — ALBUTEROL SULFATE (2.5 MG/3ML) 0.083% IN NEBU: 2.5000 mg | INHALATION_SOLUTION | Freq: Four times a day (QID) | RESPIRATORY_TRACT | Status: DC | PRN

## 2024-01-08 MED ORDER — FENTANYL CITRATE PF 50 MCG/ML IJ SOSY
50.0000 ug | PREFILLED_SYRINGE | Freq: Once | INTRAMUSCULAR | Status: AC
  Administered 2024-01-08: 50 ug via INTRAVENOUS
  Filled 2024-01-08: qty 1

## 2024-01-08 MED ORDER — CHLORHEXIDINE GLUCONATE 0.12 % MT SOLN: 15.0000 mL | Freq: Once | OROMUCOSAL | Status: DC

## 2024-01-08 MED ORDER — INDOCYANINE GREEN 25 MG IV SOLR: 1.2500 mg | Freq: Once | INTRAVENOUS | Status: DC

## 2024-01-08 MED ORDER — CHLORHEXIDINE GLUCONATE CLOTH 2 % EX PADS: 6.0000 | MEDICATED_PAD | Freq: Once | CUTANEOUS | Status: DC

## 2024-01-08 MED ORDER — ORAL CARE MOUTH RINSE: 15.0000 mL | Freq: Once | OROMUCOSAL | Status: DC

## 2024-01-08 MED ORDER — UMECLIDINIUM BROMIDE 62.5 MCG/ACT IN AEPB
1.0000 | INHALATION_SPRAY | Freq: Every day | RESPIRATORY_TRACT | Status: DC
  Administered 2024-01-10: 1 via RESPIRATORY_TRACT
  Filled 2024-01-08 (×2): qty 7

## 2024-01-08 MED ORDER — LACTATED RINGERS IV SOLN: INTRAVENOUS | Status: DC

## 2024-01-08 NOTE — H&P (Signed)
 Patient ID: Lori Rangel, female   DOB: 1961/08/17, 63 y.o.   MRN: [id number] CC: Gallstone Pancreatitis History of Present Illness Lori Rangel is a 63 y.o. female with past medical history significant for COPD who presents in consultation for gallstone pancreatitis.  The patient is known to me as she was scheduled for an elective cholecystectomy for choledocholithiasis tomorrow.  She reports that over the last 2 days she has had worsening abdominal pain.  She said 2 days ago she had abdominal pain associated with nausea and vomiting.  Her daughter reports that yesterday during the day the pain resolved but she continued to have more pain last night.  She said that she had an episode of vomiting which usually helps her pain but this time it did not.  The pain persisted so she came to the emergency department for evaluation.  On my evaluation she reports that her abdominal pain has resolved.  She described the pain when it was there as in the epigastrium and right upper quadrant with some radiation to the back.  She denies any fevers or chills.  She denies any jaundice.  She has been otherwise tolerating good p.o. intake prior to 2 days ago and has been having normal bowel function..  Past Medical History Past Medical History:  Diagnosis Date   Anemia    Aortic atherosclerosis (HCC)    Asthma    Cancer (HCC)    lung   Choledocholithiasis    COPD (chronic obstructive pulmonary disease) (HCC)    Severe by PFTs 2017   Hypothyroidism    Pneumonia    Shortness of breath dyspnea    Squamous cell carcinoma lung, left (HCC) 2017   Radiation therapy, patient was not surgical candidate   Thyroid disease    Weight decrease major recent weight loss       Past Surgical History:  Procedure Laterality Date   BREAST BIOPSY Right    COLONOSCOPY     ELECTROMAGNETIC NAVIGATION BROCHOSCOPY N/A 01/02/2016   Procedure: ELECTROMAGNETIC NAVIGATION BRONCHOSCOPY;  Surgeon: Cleve Dale, MD;  Location: [facility]  ORS;  Service: Cardiopulmonary;  Laterality: N/A;   ESOPHAGOGASTRODUODENOSCOPY (EGD) WITH PROPOFOL  N/A 08/27/2018   Procedure: ESOPHAGOGASTRODUODENOSCOPY (EGD) WITH PROPOFOL ;  Surgeon: Marnee Sink, MD;  Location: [facility] ENDOSCOPY;  Service: Endoscopy;  Laterality: N/A;   INTRAMEDULLARY (IM) NAIL INTERTROCHANTERIC Right 11/03/2017   Procedure: INTRAMEDULLARY (IM) NAIL INTERTROCHANTRIC;  Surgeon: Lorri Rota, MD;  Location: [facility] ORS;  Service: Orthopedics;  Laterality: Right;    Allergies  Allergen Reactions   Levaquin  [Levofloxacin ] Nausea Only    Cramps & Body aches    Current Facility-Administered Medications  Medication Dose Route Frequency Provider Last Rate Last Admin   acetaminophen  (TYLENOL ) tablet 650 mg  650 mg Oral Q6H PRN [name], MD       albuterol  (PROVENTIL ) (2.5 MG/3ML) 0.083% nebulizer solution 2.5 mg  2.5 mg Inhalation Q6H PRN [name], MD       arformoterol [facility]) nebulizer solution 15 mcg  15 mcg Nebulization BID [name], MD       And   umeclidinium bromide (INCRUSE ELLIPTA) 62.5 MCG/ACT 1 puff  1 puff Inhalation Daily [name], MD       enoxaparin  (LOVENOX ) injection 40 mg  40 mg Subcutaneous Q24H [name], MD       lactated ringers  infusion   Intravenous Continuous [name], MD       [START ON 01/09/2024] levothyroxine  (SYNTHROID ) tablet  25 mcg  25 mcg Oral QAC breakfast [name], MD       mirtazapine  (REMERON ) tablet 15 mg  15 mg Oral QHS [name], MD       morphine  (PF) 2 MG/ML injection 2 mg  2 mg Intravenous Once Jacquie Maudlin, MD       oxyCODONE  (Oxy IR/ROXICODONE ) immediate release tablet 5 mg  5 mg Oral Q4H PRN [name], MD       Current Outpatient Medications  Medication Sig Dispense Refill   acetaminophen  (TYLENOL ) 500 MG tablet Take 500-1,000 mg by mouth every 8 (eight) hours as needed for mild constipation or moderate pain.     aspirin  81 MG EC tablet Take 1 tablet by mouth daily.      cholecalciferol (VITAMIN D3) 25 MCG (1000 UNIT) tablet Take 1,000 Units by mouth daily.     Iron -Vitamin C  65-125 MG TABS Take 1 tablet by mouth in the morning and at bedtime. 180 tablet 4   levothyroxine  (SYNTHROID ) 25 MCG tablet Take 1 tablet (25 mcg total) by mouth daily before breakfast. 90 tablet 4   mirtazapine  (REMERON ) 15 MG tablet TAKE ONE TABLET BY MOUTH AT BEDTIME 90 tablet 0   Multiple Vitamin (MULTIVITAMIN WITH MINERALS) TABS tablet Take 1 tablet by mouth daily. 30 tablet 0   omeprazole  (PRILOSEC) 40 MG capsule Take 1 capsule (40 mg total) by mouth daily. 30 capsule 3   rosuvastatin  (CRESTOR ) 20 MG tablet Take 1 tablet (20 mg total) by mouth daily. 90 tablet 4   STIOLTO RESPIMAT  2.5-2.5 MCG/ACT AERS INHALE TWO PUFFS INTO THE LUNGS EVERY DAY 4 g 5   VENTOLIN  HFA 108 (90 Base) MCG/ACT inhaler INHALE TWO PUFFS EVERY SIX HOURS AS NEEDED FOR WHEEZING OR SHORTNESS OF BREATH 18 g 4    Family History Family History  Problem Relation Age of Onset   Cirrhosis Father    Lung cancer Sister    Breast cancer Neg Hx        Social History Social History   Tobacco Use   Smoking status: Former    Current packs/day: 0.00    Average packs/day: 2.0 packs/day for 50.0 years (100.0 ttl pk-yrs)    Types: Cigarettes    Start date: 04/03/1972    Quit date: 04/03/2022    Years since quitting: 1.7   Smokeless tobacco: Never   Tobacco comments:    Quit on 04/03/2022  Substance Use Topics   Alcohol use: Not Currently    Alcohol/week: 0.0 standard drinks of alcohol   Drug use: No        ROS Full ROS of systems performed and is otherwise negative there than what is stated in the HPI  Physical Exam Blood pressure (!) 146/66, pulse 80, temperature 98.1 F (36.7 C), temperature source Oral, resp. rate 18, height 5\' 2"  (1.575 m), weight 56.7 kg, SpO2 98%.  Patient is alert and oriented x 3, normal work of breathing on room air, regular rate and rhythm, abdomen is soft, nontender and  nondistended.  Negative Murphy sign.  She is moving all extremities spontaneously and her mood and affect are appropriate. Data Reviewed I reviewed independently her labs today show a normal kidney function.  She does have an elevated lipase at 1700 with an increase in her AST and ALT.  Her T bilirubin is 1.5.  She has a normal white count.  I reviewed independently her ultrasound that showed no significant gallbladder wall thickening.  The common  bile duct is slightly dilated at 9 mm.  I have personally reviewed the patient's imaging and medical records.    Assessment    63 year old with history of COPD with 2-day history abdominal pain associated with nausea and vomiting.  Labs concerning for gallstone pancreatitis as well as some obstructive jaundice although her T. Geradine Kitten is just slightly elevated.  On exam today her abdomen is quite benign and she feels much better.  My hope is that she has passed the stone and that her T bilirubin will downtrend tomorrow.  I discussed with her that if she does have downtrending to bilirubin tomorrow we can proceed with surgery for resection of her gallbladder.  If her T. Blima Bureau is up she may need an MRCP and potential transfer for ERCP versus laparoscopic assisted cholecystectomy with intraoperative cholangiogram.  I again discussed the risk, benefits alternatives of any surgical procedure including risk of infection, bleeding, injury to the common bile duct, retained stone and bile leak.  She understands that we will manage her expectantly and much of her clinical recommendations will be determined on what her T bilirubin is tomorrow.  Given that her pain is much improved I think it is okay to give her a clear liquid diet.  We will only provide minimal pain medication to see if her pain returns.  Will start her on gentle fluids at 75 cc an hour.  N.p.o. after midnight.  Given that there is no evidence of infection in the gallbladder on imaging I do not think any  antibiotics are indicated.    [name] 01/08/2024, 12:28 PM

## 2024-01-08 NOTE — ED Provider Notes (Signed)
 [facility] Provider Note    Event Date/Time   First MD Initiated Contact with Patient 01/08/24 0801     (approximate)   History   Chief Complaint: Abdominal Pain   HPI  Lori Rangel is a 63 y.o. female with a history of COPD, choledocholithiasis who comes to the ED with worsening upper abdominal pain and vomiting over the last 2 days, unremitting.  Has not been able to tolerate any oral intake during this time.  She is scheduled for elective cholecystectomy tomorrow with Dr. Cornel Diesel.  Denies fever or chills, no chest pain or trouble breathing.  No diarrhea        Past Medical History:  Diagnosis Date   Anemia    Aortic atherosclerosis (HCC)    Asthma    Cancer (HCC)    lung   Choledocholithiasis    COPD (chronic obstructive pulmonary disease) (HCC)    Severe by PFTs 2017   Hypothyroidism    Pneumonia    Shortness of breath dyspnea    Squamous cell carcinoma lung, left (HCC) 2017   Radiation therapy, patient was not surgical candidate   Thyroid disease    Weight decrease major recent weight loss    Current Outpatient Rx   Order #: [id number] Class: Historical Med   Order #: [id number] Class: Historical Med   Order #: [id number] Class: Historical Med   Order #: [id number] Class: Normal   Order #: [id number] Class: Normal   Order #: [id number] Class: Normal   Order #: [id number] Class: No Print   Order #: [id number] Class: Normal   Order #: [id number] Class: Normal   Order #: [id number] Class: Normal   Order #: [id number] Class: Normal    Past Surgical History:  Procedure Laterality Date   BREAST BIOPSY Right    COLONOSCOPY     ELECTROMAGNETIC NAVIGATION BROCHOSCOPY N/A 01/02/2016   Procedure: ELECTROMAGNETIC NAVIGATION BRONCHOSCOPY;  Surgeon: Cleve Dale, MD;  Location: [facility] ORS;  Service: Cardiopulmonary;  Laterality: N/A;   ESOPHAGOGASTRODUODENOSCOPY (EGD) WITH PROPOFOL  N/A 08/27/2018   Procedure: ESOPHAGOGASTRODUODENOSCOPY (EGD) WITH PROPOFOL ;   Surgeon: Marnee Sink, MD;  Location: [facility] ENDOSCOPY;  Service: Endoscopy;  Laterality: N/A;   INTRAMEDULLARY (IM) NAIL INTERTROCHANTERIC Right 11/03/2017   Procedure: INTRAMEDULLARY (IM) NAIL INTERTROCHANTRIC;  Surgeon: Lorri Rota, MD;  Location: [facility] ORS;  Service: Orthopedics;  Laterality: Right;    Physical Exam   Triage Vital Signs: ED Triage Vitals  Encounter Vitals Group     BP 01/08/24 0721 (!) 190/70     Systolic BP Percentile --      Diastolic BP Percentile --      Pulse Rate 01/08/24 0721 76     Resp 01/08/24 0721 17     Temp 01/08/24 0721 98.3 F (36.8 C)     Temp src --      SpO2 01/08/24 0721 98 %     Weight 01/08/24 0720 125 lb (56.7 kg)     Height 01/08/24 0720 5\' 2"  (1.575 m)     Head Circumference --      Peak Flow --      Pain Score 01/08/24 0720 10     Pain Loc --      Pain Education --      Exclude from Growth Chart --     Most recent vital signs: Vitals:   01/08/24 0721 01/08/24 1102  BP: (!) 190/70 (!) 146/66  Pulse: 76 80  Resp: 17 18  Temp: 98.3 F (36.8 C) 98.1 F (36.7 C)  SpO2: 98% 98%  General: Awake, no distress.  CV:  Good peripheral perfusion.  Regular rate and rhythm Resp:  Normal effort.  Clear to auscultation bilaterally Abd:  No distention.  Soft with right upper quadrant tenderness.  Positive Murphy sign. Other:  Dry oral mucosa   ED Results / Procedures / Treatments   Labs (all labs ordered are listed, but only abnormal results are displayed) Labs Reviewed  LIPASE, BLOOD - Abnormal; Notable for the following components:      Result Value   Lipase 1,751 (*)    All other components within normal limits  COMPREHENSIVE METABOLIC PANEL WITH GFR - Abnormal; Notable for the following components:   Glucose, Bld 155 (*)    AST 873 (*)    ALT 502 (*)    Alkaline Phosphatase 174 (*)    Total Bilirubin 1.5 (*)    All other components within normal limits  URINALYSIS, ROUTINE W REFLEX MICROSCOPIC - Abnormal; Notable for the  following components:   Color, Urine YELLOW (*)    APPearance CLEAR (*)    All other components within normal limits  CBC  HIV ANTIBODY (ROUTINE TESTING W REFLEX)     EKG    RADIOLOGY    PROCEDURES:  Procedures   MEDICATIONS ORDERED IN ED: Medications  morphine  (PF) 2 MG/ML injection 2 mg (0 mg Intravenous Hold 01/08/24 1121)  enoxaparin  (LOVENOX ) injection 40 mg (has no administration in time range)  lactated ringers  infusion (has no administration in time range)  acetaminophen  (TYLENOL ) tablet 650 mg (has no administration in time range)  oxyCODONE  (Oxy IR/ROXICODONE ) immediate release tablet 5 mg (has no administration in time range)  levothyroxine  (SYNTHROID ) tablet 25 mcg (has no administration in time range)  mirtazapine  (REMERON ) tablet 15 mg (has no administration in time range)  arformoterol (BROVANA) nebulizer solution 15 mcg (has no administration in time range)    And  umeclidinium bromide (INCRUSE ELLIPTA) 62.5 MCG/ACT 1 puff (has no administration in time range)  albuterol  (PROVENTIL ) (2.5 MG/3ML) 0.083% nebulizer solution 2.5 mg (has no administration in time range)  ondansetron  (ZOFRAN ) injection 4 mg (has no administration in time range)  sodium chloride  0.9 % bolus 1,000 mL (0 mLs Intravenous Stopped 01/08/24 0947)  ketorolac (TORADOL) 15 MG/ML injection 15 mg (15 mg Intravenous Given 01/08/24 0844)  fentaNYL  (SUBLIMAZE ) injection 50 mcg (50 mcg Intravenous Given 01/08/24 0842)     IMPRESSION / MDM / ASSESSMENT AND PLAN / ED COURSE  I reviewed the triage vital signs and the nursing notes.  DDx: Biliary colic, cholecystitis, choledocholithiasis, pancreatitis, gastritis  Patient's presentation is most consistent with acute presentation with potential threat to life or bodily function.  Patient presents with upper abdominal pain and tenderness with known cholelithiasis.  Vital signs unremarkable except for hypertension which I think is pain related.  No fever.   No leukocytosis.  Will follow-up chemistry panel  ----------------------------------------- 9:44 AM on 01/08/2024 ----------------------------------------- Labs show elevated LFTs and lipase of 1700 consistent with gallstone pancreatitis, possible choledocholithiasis.  GI Dr. Mabel Savage confirms that Dr. Ole Berkeley is not available for ERCP this week.  Discussed with the patient's surgeon Dr. Cornel Diesel who recommends ultrasound to evaluate for cholecystitis and CBD dilatation, which may show that MRCP/ERCP is not necessary right now.   ----------------------------------------- 12:49 PM on 01/08/2024 ----------------------------------------- Patient evaluated by surgery who will admit      FINAL CLINICAL IMPRESSION(S) / ED DIAGNOSES   Final diagnoses:  Gallstone pancreatitis     Rx / DC Orders  ED Discharge Orders     None        Note:  This document was prepared using Dragon voice recognition software and may include unintentional dictation errors.   Jacquie Maudlin, MD 01/08/24 1249

## 2024-01-08 NOTE — ED Notes (Signed)
 Pt ambulatory to bathroom independently with steady gait. Pt tolerated activity without distress.

## 2024-01-08 NOTE — ED Triage Notes (Signed)
 Patient to ED via POV for upper abd pain with vomiting. Ongoing x2 days. States she is suppose to get her gallbladder removed soon.

## 2024-01-08 NOTE — ED Notes (Addendum)
 Family at bedside.

## 2024-01-09 ENCOUNTER — Encounter
Admission: EM | Disposition: A | Discharge status: Home / Self Care | Payer: Self-pay | Source: Home / Self Care | Attending: General Surgery

## 2024-01-09 ENCOUNTER — Ambulatory Visit: Admission: RE | Admit: 2024-01-09 | Source: Home / Self Care | Admitting: General Surgery

## 2024-01-09 ENCOUNTER — Encounter: Payer: Self-pay | Admitting: General Surgery

## 2024-01-09 ENCOUNTER — Inpatient Hospital Stay: Admitting: Certified Registered"

## 2024-01-09 DIAGNOSIS — K812 Acute cholecystitis with chronic cholecystitis: Secondary | ICD-10-CM

## 2024-01-09 DIAGNOSIS — K8050 Calculus of bile duct without cholangitis or cholecystitis without obstruction: Secondary | ICD-10-CM

## 2024-01-09 DIAGNOSIS — K851 Biliary acute pancreatitis without necrosis or infection: Secondary | ICD-10-CM | POA: Diagnosis not present

## 2024-01-09 HISTORY — PX: CHOLECYSTECTOMY: SHX55

## 2024-01-09 LAB — CBC WITH DIFFERENTIAL/PLATELET
Abs Immature Granulocytes: 0.01 10*3/uL (ref 0.00–0.07)
Basophils Absolute: 0 10*3/uL (ref 0.0–0.1)
Basophils Relative: 1 %
Eosinophils Absolute: 0 10*3/uL (ref 0.0–0.5)
Eosinophils Relative: 1 %
HCT: 35.5 % — ABNORMAL LOW (ref 36.0–46.0)
Hemoglobin: 12 g/dL (ref 12.0–15.0)
Immature Granulocytes: 0 %
Lymphocytes Relative: 36 %
Lymphs Abs: 1.4 10*3/uL (ref 0.7–4.0)
MCH: 31 pg (ref 26.0–34.0)
MCHC: 33.8 g/dL (ref 30.0–36.0)
MCV: 91.7 fL (ref 80.0–100.0)
Monocytes Absolute: 0.2 10*3/uL (ref 0.1–1.0)
Monocytes Relative: 6 %
Neutro Abs: 2.2 10*3/uL (ref 1.7–7.7)
Neutrophils Relative %: 56 %
Platelets: 199 10*3/uL (ref 150–400)
RBC: 3.87 MIL/uL (ref 3.87–5.11)
RDW: 12.1 % (ref 11.5–15.5)
WBC: 3.9 10*3/uL — ABNORMAL LOW (ref 4.0–10.5)
nRBC: 0 % (ref 0.0–0.2)

## 2024-01-09 LAB — COMPREHENSIVE METABOLIC PANEL WITH GFR
ALT: 230 U/L — ABNORMAL HIGH (ref 0–44)
AST: 190 U/L — ABNORMAL HIGH (ref 15–41)
Albumin: 3.3 g/dL — ABNORMAL LOW (ref 3.5–5.0)
Alkaline Phosphatase: 150 U/L — ABNORMAL HIGH (ref 38–126)
Anion gap: 8 (ref 5–15)
BUN: 7 mg/dL — ABNORMAL LOW (ref 8–23)
CO2: 22 mmol/L (ref 22–32)
Calcium: 8.5 mg/dL — ABNORMAL LOW (ref 8.9–10.3)
Chloride: 108 mmol/L (ref 98–111)
Creatinine, Ser: 0.75 mg/dL (ref 0.44–1.00)
GFR, Estimated: >60 mL/min (ref >60)
Glucose, Bld: 84 mg/dL (ref 70–99)
Potassium: 3.7 mmol/L (ref 3.5–5.1)
Sodium: 138 mmol/L (ref 135–145)
Total Bilirubin: 1.2 mg/dL (ref 0.0–1.2)
Total Protein: 5.9 g/dL — ABNORMAL LOW (ref 6.5–8.1)

## 2024-01-09 LAB — HIV ANTIBODY (ROUTINE TESTING W REFLEX): HIV Screen 4th Generation wRfx: NONREACTIVE

## 2024-01-09 SURGERY — CHOLECYSTECTOMY, ROBOT-ASSISTED, LAPAROSCOPIC
Anesthesia: General | Site: Abdomen

## 2024-01-09 MED ORDER — BUPIVACAINE LIPOSOME 1.3 % IJ SUSP
INTRAMUSCULAR | Status: AC
  Filled 2024-01-09: qty 10

## 2024-01-09 MED ORDER — INDOCYANINE GREEN 25 MG IV SOLR
INTRAVENOUS | Status: AC
  Filled 2024-01-09: qty 10

## 2024-01-09 MED ORDER — DEXAMETHASONE SODIUM PHOSPHATE 10 MG/ML IJ SOLN
INTRAMUSCULAR | Status: DC | PRN
  Administered 2024-01-09: 10 mg via INTRAVENOUS

## 2024-01-09 MED ORDER — 0.9 % SODIUM CHLORIDE (POUR BTL) OPTIME
TOPICAL | Status: DC | PRN
  Administered 2024-01-09: 500 mL

## 2024-01-09 MED ORDER — SODIUM CHLORIDE 0.9 % IV SOLN
INTRAVENOUS | Status: AC
  Filled 2024-01-09: qty 2

## 2024-01-09 MED ORDER — OXYCODONE HCL 5 MG/5ML PO SOLN: 5.0000 mg | Freq: Once | ORAL | Status: DC | PRN

## 2024-01-09 MED ORDER — FENTANYL CITRATE (PF) 100 MCG/2ML IJ SOLN
INTRAMUSCULAR | Status: DC | PRN
  Administered 2024-01-09 (×4): 50 ug via INTRAVENOUS

## 2024-01-09 MED ORDER — ACETAMINOPHEN 10 MG/ML IV SOLN: 1000.0000 mg | Freq: Once | INTRAVENOUS | Status: DC | PRN

## 2024-01-09 MED ORDER — DROPERIDOL 2.5 MG/ML IJ SOLN: 0.6250 mg | Freq: Once | INTRAMUSCULAR | Status: DC | PRN

## 2024-01-09 MED ORDER — ONDANSETRON HCL 4 MG/2ML IJ SOLN
INTRAMUSCULAR | Status: DC | PRN
  Administered 2024-01-09: 4 mg via INTRAVENOUS

## 2024-01-09 MED ORDER — SODIUM CHLORIDE 0.9 % IV SOLN
2.0000 g | Freq: Once | INTRAVENOUS | Status: DC
  Filled 2024-01-09: qty 2

## 2024-01-09 MED ORDER — ROCURONIUM BROMIDE 100 MG/10ML IV SOLN
INTRAVENOUS | Status: DC | PRN
  Administered 2024-01-09: 10 mg via INTRAVENOUS
  Administered 2024-01-09: 50 mg via INTRAVENOUS

## 2024-01-09 MED ORDER — BUPIVACAINE-EPINEPHRINE 0.5% -1:200000 IJ SOLN
INTRAMUSCULAR | Status: DC | PRN
  Administered 2024-01-09: 20 mL via INTRAMUSCULAR

## 2024-01-09 MED ORDER — FENTANYL CITRATE (PF) 100 MCG/2ML IJ SOLN
INTRAMUSCULAR | Status: AC
  Filled 2024-01-09: qty 2

## 2024-01-09 MED ORDER — FENTANYL CITRATE (PF) 100 MCG/2ML IJ SOLN
INTRAMUSCULAR | Status: AC
Start: 2024-01-09 — End: ?
  Filled 2024-01-09: qty 2

## 2024-01-09 MED ORDER — PROPOFOL 10 MG/ML IV BOLUS
INTRAVENOUS | Status: AC
  Filled 2024-01-09: qty 20

## 2024-01-09 MED ORDER — DEXMEDETOMIDINE HCL IN NACL 80 MCG/20ML IV SOLN
INTRAVENOUS | Status: DC | PRN
  Administered 2024-01-09: 8 ug via INTRAVENOUS

## 2024-01-09 MED ORDER — FENTANYL CITRATE (PF) 100 MCG/2ML IJ SOLN: 25.0000 ug | INTRAMUSCULAR | Status: DC | PRN

## 2024-01-09 MED ORDER — INDOCYANINE GREEN 25 MG IV SOLR
1.2500 mg | Freq: Once | INTRAVENOUS | Status: AC
Start: 2024-01-09 — End: 2024-01-09
  Administered 2024-01-09: 1.25 mg via TOPICAL

## 2024-01-09 MED ORDER — LIDOCAINE HCL (CARDIAC) PF 100 MG/5ML IV SOSY
PREFILLED_SYRINGE | INTRAVENOUS | Status: DC | PRN
  Administered 2024-01-09: 60 mg via INTRAVENOUS

## 2024-01-09 MED ORDER — MIDAZOLAM HCL 2 MG/2ML IJ SOLN
INTRAMUSCULAR | Status: DC | PRN
  Administered 2024-01-09: 1 mg via INTRAVENOUS

## 2024-01-09 MED ORDER — PROPOFOL 10 MG/ML IV BOLUS
INTRAVENOUS | Status: DC | PRN
  Administered 2024-01-09: 130 mg via INTRAVENOUS
  Administered 2024-01-09: 30 mg via INTRAVENOUS

## 2024-01-09 MED ORDER — BUPIVACAINE-EPINEPHRINE (PF) 0.5% -1:200000 IJ SOLN
INTRAMUSCULAR | Status: AC
  Filled 2024-01-09: qty 10

## 2024-01-09 MED ORDER — MIDAZOLAM HCL 2 MG/2ML IJ SOLN
INTRAMUSCULAR | Status: AC
  Filled 2024-01-09: qty 2

## 2024-01-09 MED ORDER — SODIUM CHLORIDE 0.9 % IV SOLN
INTRAVENOUS | Status: DC | PRN
  Administered 2024-01-09: 2 g via INTRAVENOUS

## 2024-01-09 MED ORDER — OXYCODONE HCL 5 MG PO TABS: 5.0000 mg | ORAL_TABLET | Freq: Once | ORAL | Status: DC | PRN

## 2024-01-09 MED ORDER — SUGAMMADEX SODIUM 200 MG/2ML IV SOLN
INTRAVENOUS | Status: DC | PRN
  Administered 2024-01-09: 200 mg via INTRAVENOUS

## 2024-01-09 SURGICAL SUPPLY — 39 items
BAG PRESSURE INF REUSE 1000 (BAG) IMPLANT
CANNULA REDUCER 12-8 DVNC XI (CANNULA) ×1 IMPLANT
CAUTERY HOOK MNPLR 1.6 DVNC XI (INSTRUMENTS) ×1 IMPLANT
CLIP LIGATING HEM O LOK PURPLE (MISCELLANEOUS) IMPLANT
CLIP LIGATING HEMO O LOK GREEN (MISCELLANEOUS) ×1 IMPLANT
DERMABOND ADVANCED .7 DNX12 (GAUZE/BANDAGES/DRESSINGS) ×1 IMPLANT
DRAPE ARM DVNC X/XI (DISPOSABLE) ×4 IMPLANT
DRAPE COLUMN DVNC XI (DISPOSABLE) ×1 IMPLANT
ELECTRODE REM PT RTRN 9FT ADLT (ELECTROSURGICAL) ×1 IMPLANT
FORCEPS BPLR 8 MD DVNC XI (FORCEP) IMPLANT
FORCEPS BPLR R/ABLATION 8 DVNC (INSTRUMENTS) ×1 IMPLANT
FORCEPS PROGRASP DVNC XI (FORCEP) ×1 IMPLANT
GLOVE BIOGEL PI IND STRL 7.5 (GLOVE) ×2 IMPLANT
GLOVE SURG SYN 7.0 (GLOVE) ×2 IMPLANT
GLOVE SURG SYN 7.0 PF PI (GLOVE) ×2 IMPLANT
GOWN STRL REUS W/ TWL LRG LVL3 (GOWN DISPOSABLE) ×3 IMPLANT
GRASPER SUT TROCAR 14GX15 (MISCELLANEOUS) ×1 IMPLANT
IRRIGATOR SUCT 8 DISP DVNC XI (IRRIGATION / IRRIGATOR) IMPLANT
IV NS 1000ML BAXH (IV SOLUTION) IMPLANT
KIT PINK PAD W/HEAD ARE REST (MISCELLANEOUS) ×1 IMPLANT
KIT PINK PAD W/HEAD ARM REST (MISCELLANEOUS) ×1 IMPLANT
LABEL OR SOLS (LABEL) ×1 IMPLANT
MANIFOLD NEPTUNE II (INSTRUMENTS) ×1 IMPLANT
NDL HYPO 22X1.5 SAFETY MO (MISCELLANEOUS) ×1 IMPLANT
NDL INSUFFLATION 14GA 120MM (NEEDLE) ×1 IMPLANT
NEEDLE HYPO 22X1.5 SAFETY MO (MISCELLANEOUS) ×1 IMPLANT
NEEDLE INSUFFLATION 14GA 120MM (NEEDLE) ×1 IMPLANT
NS IRRIG 500ML POUR BTL (IV SOLUTION) ×1 IMPLANT
OBTURATOR OPTICALSTD 8 DVNC (TROCAR) ×1 IMPLANT
PACK LAP CHOLECYSTECTOMY (MISCELLANEOUS) ×1 IMPLANT
SEAL UNIV 5-12 XI (MISCELLANEOUS) ×4 IMPLANT
SET TUBE SMOKE EVAC HIGH FLOW (TUBING) ×1 IMPLANT
SOLUTION ELECTROSURG ANTI STCK (MISCELLANEOUS) ×1 IMPLANT
SPIKE FLUID TRANSFER (MISCELLANEOUS) ×2 IMPLANT
SPONGE T-LAP 4X18 ~~LOC~~+RFID (SPONGE) IMPLANT
SUT VICRYL 0 UR6 27IN ABS (SUTURE) ×1 IMPLANT
SUTURE MNCRL 4-0 27XMF (SUTURE) ×1 IMPLANT
SYSTEM BAG RETRIEVAL 10MM (BASKET) ×1 IMPLANT
WATER STERILE IRR 500ML POUR (IV SOLUTION) ×1 IMPLANT

## 2024-01-09 NOTE — Progress Notes (Signed)
 CC: gallstone pancreatitis Subjective: Patient reports feeling better today. Pain was peak yesterday at 3/10 and only needed tylenol . She denies any nausea or vomiting.   Objective: Vital signs in last 24 hours: Temp:  [97.9 F (36.6 C)-98.4 F (36.9 C)] 98.2 F (36.8 C) (05/09 0810) Pulse Rate:  [66-80] 70 (05/09 0810) Resp:  [16-18] 18 (05/09 0810) BP: (146-172)/(59-68) 152/68 (05/09 0810) SpO2:  [91 %-100 %] 97 % (05/09 0810) Last BM Date : 01/08/24 (per patient "before i left home")  Intake/Output from previous day: 05/08 0701 - 05/09 0700 In: 999 [IV Piggyback:999] Out: -  Intake/Output this shift: No intake/output data recorded.  Physical exam:  Abdomen is soft, non-tender, non-distended, no rebound tenderness or guarding, negative murphy's sign, no jaundice  Lab Results: CBC  Recent Labs    01/08/24 0722 01/09/24 0700  WBC 5.0 3.9*  HGB 13.5 12.0  HCT 40.4 35.5*  PLT 259 199   BMET Recent Labs    01/08/24 0722 01/09/24 0700  NA 137 138  K 4.0 3.7  CL 100 108  CO2 26 22  GLUCOSE 155* 84  BUN 10 7*  CREATININE 0.74 0.75  CALCIUM  9.2 8.5*   PT/INR No results for input(s): "LABPROT", "INR" in the last 72 hours. ABG No results for input(s): "PHART", "HCO3" in the last 72 hours.  Invalid input(s): "PCO2", "PO2"  Studies/Results: US  ABDOMEN LIMITED RUQ (LIVER/GB) Result Date: 01/08/2024 CLINICAL DATA:  Right upper quadrant abdominal pain.  Elevated LFTs. EXAM: ULTRASOUND ABDOMEN LIMITED RIGHT UPPER QUADRANT COMPARISON:  Ultrasound dated 12/18/2023. FINDINGS: Gallbladder: There is layering sludge and small stones in the gallbladder. No significant gallbladder wall thickening. Trace pericholecystic fluid may be present. Negative sonographic Murphy's sign. Common bile duct: Diameter: 9 mm Liver: No focal lesion identified. Within normal limits in parenchymal echogenicity. Portal vein is patent on color Doppler imaging with normal direction of blood flow  towards the liver. Other: None. IMPRESSION: Cholelithiasis without definite sonographic evidence of acute cholecystitis. Electronically Signed   By: Angus Bark M.D.   On: 01/08/2024 11:24    Anti-infectives: Anti-infectives (From admission, onward)    None       Assessment/Plan:  Patient with gallstone pancreatitis. Pain has improved, t. Bili has normalized and all labs are trending downward. Will proceed with robo cholecystectomy, discussed r/b/a including risk of infeciton, bleeding, retained stone, CBD injury, bile leak. She understands these risks. Proceed with surgery.   A total of 35 minutes was spent reviewing imaging, labs, performing an interval history and physical and discussing treatment options with the patient.    Severa Daniels, M.D. [facility]

## 2024-01-09 NOTE — Anesthesia Preprocedure Evaluation (Signed)
 Anesthesia Evaluation  Patient identified by MRN, date of birth, ID band Patient awake    Reviewed: Allergy & Precautions, H&P , NPO status , Patient's Chart, lab work & pertinent test results, reviewed documented beta blocker date and time   Airway Mallampati: II  TM Distance: >3 FB Neck ROM: full    Dental  (+) Teeth Intact   Pulmonary shortness of breath and with exertion, asthma , pneumonia, COPD,  COPD inhaler, former smoker   Pulmonary exam normal        Cardiovascular Exercise Tolerance: Poor negative cardio ROS Normal cardiovascular exam Rhythm:regular Rate:Normal     Neuro/Psych negative neurological ROS  negative psych ROS   GI/Hepatic negative GI ROS, Neg liver ROS,GERD  Medicated,,  Endo/Other  negative endocrine ROSHypothyroidism    Renal/GU negative Renal ROS  negative genitourinary   Musculoskeletal   Abdominal   Peds  Hematology  (+) Blood dyscrasia, anemia   Anesthesia Other Findings Past Medical History: No date: Anemia No date: Aortic atherosclerosis (HCC) No date: Asthma No date: Cancer [facility])     Comment:  lung No date: Choledocholithiasis No date: COPD (chronic obstructive pulmonary disease) (HCC)     Comment:  Severe by PFTs 2017 No date: Hypothyroidism No date: Pneumonia No date: Shortness of breath dyspnea 2017: Squamous cell carcinoma lung, left (HCC)     Comment:  Radiation therapy, patient was not surgical candidate No date: Thyroid disease major recent weight loss: Weight decrease Past Surgical History: No date: BREAST BIOPSY; Right No date: COLONOSCOPY 01/02/2016: ELECTROMAGNETIC NAVIGATION BROCHOSCOPY; N/A     Comment:  Procedure: ELECTROMAGNETIC NAVIGATION BRONCHOSCOPY;                Surgeon: Cleve Dale, MD;  Location: [facility] ORS;  Service:               Cardiopulmonary;  Laterality: N/A; 08/27/2018: ESOPHAGOGASTRODUODENOSCOPY (EGD) WITH PROPOFOL ; N/A     Comment:   Procedure: ESOPHAGOGASTRODUODENOSCOPY (EGD) WITH               PROPOFOL ;  Surgeon: Marnee Sink, MD;  Location: [facility]               ENDOSCOPY;  Service: Endoscopy;  Laterality: N/A; 11/03/2017: INTRAMEDULLARY (IM) NAIL INTERTROCHANTERIC; Right     Comment:  Procedure: INTRAMEDULLARY (IM) NAIL INTERTROCHANTRIC;                Surgeon: Lorri Rota, MD;  Location: [facility] ORS;  Service:              Orthopedics;  Laterality: Right; BMI    Body Mass Index: 22.86 kg/m     Reproductive/Obstetrics negative OB ROS                             Anesthesia Physical Anesthesia Plan  ASA: 3 and emergent  Anesthesia Plan: General ETT   Post-op Pain Management:    Induction:   PONV Risk Score and Plan: 4 or greater  Airway Management Planned:   Additional Equipment:   Intra-op Plan:   Post-operative Plan:   Informed Consent: I have reviewed the patients History and Physical, chart, labs and discussed the procedure including the risks, benefits and alternatives for the proposed anesthesia with the patient or authorized representative who has indicated his/her understanding and acceptance.     Dental Advisory Given  Plan Discussed with: CRNA  Anesthesia Plan Comments:        Anesthesia  Quick Evaluation

## 2024-01-09 NOTE — Discharge Instructions (Signed)
 You have surgical glue over your incisions.  This will peel off over the next 2 to 4 weeks.  You may shower on postoperative day 1.  Please at the warm water run over your incisions and pat dry.  Please do not submerge the wounds for 2 weeks.  You may take Tylenol  and ibuprofen as needed for pain.  You will follow-up in 2 weeks.  Laparoscopic Cholecystectomy, Care After   These instructions give you information on caring for yourself after your procedure. Your doctor may also give you more specific instructions. Call your doctor if you have any problems or questions after your procedure.  HOME CARE  Change your bandages (dressings) as told by your doctor.  Keep the wound dry and clean. Wash the wound gently with soap and water. Pat the wound dry with a clean towel.  Do not take baths, swim, or use hot tubs for 2 weeks, or as told by your doctor.  Only take medicine as told by your doctor.  Eat a normal diet as told by your doctor.  Do not lift anything heavier than 10 pounds (4.5 kg) until your doctor says it is okay.  Do not play contact sports for 1 week, or as told by your doctor. GET HELP IF:  Your wound is red, puffy (swollen), or painful.  You have yellowish-white fluid (pus) coming from the wound.  You have fluid draining from the wound for more than 1 day.  You have a bad smell coming from the wound.  Your wound breaks open. GET HELP RIGHT AWAY IF:  You have trouble breathing.  You have chest pain.  You have a fever >101  You have pain in the shoulders (shoulder strap areas) that is getting worse.  You feel dizzy or pass out (faint).  You have severe belly (abdominal) pain.  You feel sick to your stomach (nauseous) or throw up (vomit) for more than 1 day.

## 2024-01-09 NOTE — Anesthesia Procedure Notes (Signed)
 Procedure Name: Intubation Date/Time: 01/09/2024 10:05 AM  Performed by: Noelia Batman, CRNAPre-anesthesia Checklist: Patient identified, Emergency Drugs available, Suction available and Patient being monitored Patient Re-evaluated:Patient Re-evaluated prior to induction Oxygen Delivery Method: Circle System Utilized Preoxygenation: Pre-oxygenation with 100% oxygen Induction Type: IV induction Ventilation: Mask ventilation without difficulty Laryngoscope Size: McGrath and 3 Grade View: Grade I Tube type: Oral Tube size: 7.0 mm Number of attempts: 1 Airway Equipment and Method: Stylet and Oral airway Placement Confirmation: ETT inserted through vocal cords under direct vision, positive ETCO2 and breath sounds checked- equal and bilateral Secured at: 19 cm Tube secured with: Tape Dental Injury: Teeth and Oropharynx as per pre-operative assessment  Comments: Pt with black and broken teeth preop. Thorough dental advisory discussed. Pt wishes to proceed. No dental or lip trauma during intubation.

## 2024-01-09 NOTE — Anesthesia Postprocedure Evaluation (Signed)
 Anesthesia Post Note  Patient: ARGELIA THUROW  Procedure(s) Performed: CHOLECYSTECTOMY, ROBOT-ASSISTED, LAPAROSCOPIC (Abdomen)  Patient location during evaluation: PACU Anesthesia Type: General Level of consciousness: awake and alert Pain management: pain level controlled Vital Signs Assessment: post-procedure vital signs reviewed and stable Respiratory status: spontaneous breathing, nonlabored ventilation, respiratory function stable and patient connected to nasal cannula oxygen Cardiovascular status: blood pressure returned to baseline and stable Postop Assessment: no apparent nausea or vomiting Anesthetic complications: no   No notable events documented.   Last Vitals:  Vitals:   01/09/24 1230 01/09/24 1245  BP: (!) 168/63 (!) 172/62  Pulse: 82 75  Resp: 19 16  Temp:  (!) 36.2 C  SpO2: 92% 98%    Last Pain:  Vitals:   01/09/24 1434  TempSrc:   PainSc: 10-Worst pain ever                 Zula Hitch

## 2024-01-09 NOTE — Transfer of Care (Signed)
 Immediate Anesthesia Transfer of Care Note  Patient: Lori Rangel  Procedure(s) Performed: CHOLECYSTECTOMY, ROBOT-ASSISTED, LAPAROSCOPIC (Abdomen)  Patient Location: PACU  Anesthesia Type:General  Level of Consciousness: drowsy and patient cooperative  Airway & Oxygen Therapy: Patient Spontanous Breathing and Patient connected to face mask oxygen  Post-op Assessment: Report given to RN, Post -op Vital signs reviewed and stable, and Patient moving all extremities X 4  Post vital signs: Reviewed and stable  Last Vitals:  Vitals Value Taken Time  BP 170/60 01/09/24 1152  Temp 36.3 C 01/09/24 1152  Pulse 62 01/09/24 1156  Resp 21 01/09/24 1156  SpO2 100 % 01/09/24 1156  Vitals shown include unfiled device data.  Last Pain:  Vitals:   01/09/24 1152  TempSrc:   PainSc: 0-No pain         Complications: No notable events documented.

## 2024-01-09 NOTE — Op Note (Signed)
 Robotic assisted laparoscopic Cholecystectomy  Pre-operative Diagnosis: Gallstone pancreatitis  Post-operative Diagnosis: Gallstone pancreatitis, acute on chronic cholecystitis  Procedure:  Robotic assisted laparoscopic Cholecystectomy  Surgeon: Severa Daniels, MD  Anesthesia: Gen. with endotracheal tube  Findings: Mildly inflamed gallbladder, perforation of gallbladder during case with expulsion of pus, critical view of safety obtained  Estimated Blood Loss: 10cc       Specimens: Gallbladder           Complications: none   Procedure Details  The patient was seen again in the Holding Room. The benefits, complications, treatment options, and expected outcomes were discussed with the patient. The risks of bleeding, infection, recurrence of symptoms, failure to resolve symptoms, bile duct damage, bile duct leak, retained common bile duct stone, bowel injury, any of which could require further surgery and/or ERCP, stent, or papillotomy were reviewed with the patient. The likelihood of improving the patient's symptoms with return to their baseline status is good.  The patient and/or family concurred with the proposed plan, giving informed consent.  The patient was taken to Operating Room, identified  and the procedure verified as robotic Cholecystectomy.  A Time Out was held and the above information confirmed.  Prior to the induction of general anesthesia, antibiotic prophylaxis was administered. VTE prophylaxis was in place. General endotracheal anesthesia was then administered and tolerated well. After the induction, the abdomen was prepped with Chloraprep and draped in the sterile fashion. The patient was positioned in the supine position.  A veress needle was inserted into the abdomen using standard drop technique. An 8mm infra-umbilical robotic port was then placed under direct visualization. There was no injury noted at the site of veress needle insertion. Two right sided abdominal 8mm  ports followed by an 12mm left abdominal robotic ports were placed under direct visualization.  The patient was positioned  in reverse Trendelenburg, robot was brought to the surgical field and docked in the standard fashion.  We made sure all the instrumentation was kept indirect view at all times and that there were no collision between the arms. I scrubbed out and went to the console.  The gallbladder was identified, the fundus grasped and retracted cephalad. Adhesions were lysed bluntly. The infundibulum was grasped and retracted laterally, exposing the peritoneum overlying the triangle of Calot. This was then divided and exposed in a blunt fashion. An extended critical view of the cystic duct and cystic artery was obtained.  There was perforation of the gallbladder and expulsion of pus.  The cystic duct was clearly identified and bluntly dissected.   Artery and duct were double clipped and divided. Using ICG cholangiography we visualized the cystic duct. The gallbladder was taken from the gallbladder fossa in a retrograde fashion with the electrocautery.  Hemostasis was achieved with the electrocautery. nspection of the right upper quadrant was performed. No bleeding, bile duct injury or leak, or bowel injury was noted.  The right upper quadrant was then copiously irrigated with warm saline and suctioned out. Robotic instruments and robotic arms were undocked in the standard fashion.  I scrubbed back in.  The gallbladder was removed and placed in an Endocatch bag.   The left lower quadrant fascia was then closed with a 0 vicryl using a suture needle passer. The pre-peritoneal space was then infiltrated with liposomal bupivicaine and marcaine  solution. Pneumoperitoneum was released.  4-0 subcuticular Monocryl was used to close the skin. Dermabond was  applied.  The patient was then extubated and brought to the recovery room in  stable condition. Sponge, lap, and needle counts were correct at closure  and at the conclusion of the case.               Severa Daniels, M.D. [facility]

## 2024-01-10 LAB — CBC WITH DIFFERENTIAL/PLATELET
Abs Immature Granulocytes: 0.05 10*3/uL (ref 0.00–0.07)
Basophils Absolute: 0 10*3/uL (ref 0.0–0.1)
Basophils Relative: 0 %
Eosinophils Absolute: 0 10*3/uL (ref 0.0–0.5)
Eosinophils Relative: 0 %
HCT: 37.8 % (ref 36.0–46.0)
Hemoglobin: 12.6 g/dL (ref 12.0–15.0)
Immature Granulocytes: 0 %
Lymphocytes Relative: 9 %
Lymphs Abs: 1 10*3/uL (ref 0.7–4.0)
MCH: 30.7 pg (ref 26.0–34.0)
MCHC: 33.3 g/dL (ref 30.0–36.0)
MCV: 92 fL (ref 80.0–100.0)
Monocytes Absolute: 0.5 10*3/uL (ref 0.1–1.0)
Monocytes Relative: 4 %
Neutro Abs: 10.4 10*3/uL — ABNORMAL HIGH (ref 1.7–7.7)
Neutrophils Relative %: 87 %
Platelets: 218 10*3/uL (ref 150–400)
RBC: 4.11 MIL/uL (ref 3.87–5.11)
RDW: 12.4 % (ref 11.5–15.5)
WBC: 11.9 10*3/uL — ABNORMAL HIGH (ref 4.0–10.5)
nRBC: 0 % (ref 0.0–0.2)

## 2024-01-10 LAB — COMPREHENSIVE METABOLIC PANEL WITH GFR
ALT: 160 U/L — ABNORMAL HIGH (ref 0–44)
AST: 83 U/L — ABNORMAL HIGH (ref 15–41)
Albumin: 3.3 g/dL — ABNORMAL LOW (ref 3.5–5.0)
Alkaline Phosphatase: 122 U/L (ref 38–126)
Anion gap: 10 (ref 5–15)
BUN: 8 mg/dL (ref 8–23)
CO2: 24 mmol/L (ref 22–32)
Calcium: 8.7 mg/dL — ABNORMAL LOW (ref 8.9–10.3)
Chloride: 102 mmol/L (ref 98–111)
Creatinine, Ser: 0.8 mg/dL (ref 0.44–1.00)
GFR, Estimated: >60 mL/min (ref >60)
Glucose, Bld: 115 mg/dL — ABNORMAL HIGH (ref 70–99)
Potassium: 3.9 mmol/L (ref 3.5–5.1)
Sodium: 136 mmol/L (ref 135–145)
Total Bilirubin: 1 mg/dL (ref 0.0–1.2)
Total Protein: 6.4 g/dL — ABNORMAL LOW (ref 6.5–8.1)

## 2024-01-10 MED ORDER — MORPHINE SULFATE (PF) 2 MG/ML IV SOLN
2.0000 mg | INTRAVENOUS | Status: DC | PRN
  Administered 2024-01-10: 2 mg via INTRAVENOUS
  Filled 2024-01-10: qty 1

## 2024-01-10 MED ORDER — SODIUM CHLORIDE 0.9 % IV SOLN
1.0000 g | Freq: Three times a day (TID) | INTRAVENOUS | Status: DC
  Administered 2024-01-10: 1 g via INTRAVENOUS
  Filled 2024-01-10 (×3): qty 1

## 2024-01-10 MED ORDER — AMOXICILLIN-POT CLAVULANATE 875-125 MG PO TABS: 1.0000 | ORAL_TABLET | Freq: Two times a day (BID) | ORAL | 0 refills | Status: DC

## 2024-01-10 MED ORDER — OXYCODONE HCL 5 MG PO TABS: 5.0000 mg | ORAL_TABLET | ORAL | 0 refills | Status: DC | PRN

## 2024-01-10 NOTE — Discharge Summary (Signed)
 Patient ID: Lori Rangel MRN: [id number] DOB/AGE: 12/24/1960 63 y.o.  Admit date: 01/08/2024 Discharge date: 01/10/2024   Discharge Diagnoses:  Principal Problem:   Gallstone pancreatitis   Procedures: Robotic cholecystectomy  Hospital Course:  63 year old female admitted with findings consistent with acute Gallstone pancreatitis she was admitted resuscitated and once she was optimized  she  was taken promptly to the operating room for an uneventful laparoscopic Robotic  cholecystectomy.  Patient was kept overnight. Initially she did have slight elevation of bilirubin that appropriately decrease in the post op period.  At The time of discharge the patient was ambulating,  pain was controlled.  Her vital signs were stable and she was afebrile.   physical exam at discharge showed a pt  in no acute distress.  Awake and alert.  Abdomen: Soft incisions healing well without infection or peritonitis.  Extremities well-perfused and no edema.  Condition of the patient the time of discharge was stable   Disposition: Discharge disposition: 01-Home or Self Care       Discharge Instructions     Call MD for:  difficulty breathing, headache or visual disturbances   Complete by: As directed    Call MD for:  difficulty breathing, headache or visual disturbances   Complete by: As directed    Call MD for:  extreme fatigue   Complete by: As directed    Call MD for:  extreme fatigue   Complete by: As directed    Call MD for:  hives   Complete by: As directed    Call MD for:  hives   Complete by: As directed    Call MD for:  persistant dizziness or light-headedness   Complete by: As directed    Call MD for:  persistant dizziness or light-headedness   Complete by: As directed    Call MD for:  persistant nausea and vomiting   Complete by: As directed    Call MD for:  persistant nausea and vomiting   Complete by: As directed    Call MD for:  redness, tenderness, or signs of infection (pain,  swelling, redness, odor or green/yellow discharge around incision site)   Complete by: As directed    Call MD for:  redness, tenderness, or signs of infection (pain, swelling, redness, odor or green/yellow discharge around incision site)   Complete by: As directed    Call MD for:  severe uncontrolled pain   Complete by: As directed    Call MD for:  severe uncontrolled pain   Complete by: As directed    Call MD for:  temperature >100.4   Complete by: As directed    Call MD for:  temperature >100.4   Complete by: As directed    Diet - low sodium heart healthy   Complete by: As directed    Diet - low sodium heart healthy   Complete by: As directed    Discharge instructions   Complete by: As directed    May DC after tolerating Lunch today   Increase activity slowly   Complete by: As directed    Increase activity slowly   Complete by: As directed    Lifting restrictions   Complete by: As directed    20 lbs x 6 wks      Allergies as of 01/10/2024       Reactions   Levaquin  [levofloxacin ] Nausea Only   Cramps & Body aches        Medication List  TAKE these medications    acetaminophen  500 MG tablet Commonly known as: TYLENOL  Take 500-1,000 mg by mouth every 8 (eight) hours as needed for mild constipation or moderate pain.   amoxicillin-clavulanate 875-125 MG tablet Commonly known as: AUGMENTIN Take 1 tablet by mouth 2 (two) times daily.   cholecalciferol 25 MCG (1000 UNIT) tablet Commonly known as: VITAMIN D3 Take 1,000 Units by mouth daily.   Iron -Vitamin C  65-125 MG Tabs Take 1 tablet by mouth in the morning and at bedtime.   levothyroxine  25 MCG tablet Commonly known as: SYNTHROID  Take 1 tablet (25 mcg total) by mouth daily before breakfast.   mirtazapine  15 MG tablet Commonly known as: REMERON  TAKE ONE TABLET BY MOUTH AT BEDTIME   multivitamin with minerals Tabs tablet Take 1 tablet by mouth daily.   omeprazole  40 MG capsule Commonly known as:  PRILOSEC Take 1 capsule (40 mg total) by mouth daily.   oxyCODONE  5 MG immediate release tablet Commonly known as: Oxy IR/ROXICODONE  Take 1 tablet (5 mg total) by mouth every 4 (four) hours as needed for moderate pain (pain score 4-6).   rosuvastatin  20 MG tablet Commonly known as: Crestor  Take 1 tablet (20 mg total) by mouth daily.   Stiolto Respimat  2.5-2.5 MCG/ACT Aers Generic drug: Tiotropium Bromide-Olodaterol INHALE TWO PUFFS INTO THE LUNGS EVERY DAY   Ventolin  HFA 108 (90 Base) MCG/ACT inhaler Generic drug: albuterol  INHALE TWO PUFFS EVERY SIX HOURS AS NEEDED FOR WHEEZING OR SHORTNESS OF BREATH        Follow-up Information     Ada Acres, PA-C Follow up in 2 week(s).   Specialty: Physician Assistant Contact information: [address] [place] [address] [phone]                  Evelia Hipp, MD FACS

## 2024-01-13 LAB — SURGICAL PATHOLOGY

## 2024-01-30 ENCOUNTER — Ambulatory Visit: Admitting: Nurse Practitioner

## 2024-02-01 NOTE — Patient Instructions (Signed)
 Be Involved in Caring For Your Health:  Taking Medications When medications are taken as directed, they can greatly improve your health. But if they are not taken as prescribed, they may not work. In some cases, not taking them correctly can be harmful. To help ensure your treatment remains effective and safe, understand your medications and how to take them. Bring your medications to each visit for review by your provider.  Your lab results, notes, and after visit summary will be available on My Chart. We strongly encourage you to use this feature. If lab results are abnormal the clinic will contact you with the appropriate steps. If the clinic does not contact you assume the results are satisfactory. You can always view your results on My Chart. If you have questions regarding your health or results, please contact the clinic during office hours. You can also ask questions on My Chart.  We at [facility] are grateful that you chose [place] to provide your care. We strive to provide evidence-based and compassionate care and are always looking for feedback. If you get a survey from the clinic please complete this so we can hear your opinions.  Heart-Healthy Eating Plan Many factors influence your heart health, including eating and exercise habits. Heart health is also called coronary health. Coronary risk increases with abnormal blood fat (lipid) levels. A heart-healthy eating plan includes limiting unhealthy fats, increasing healthy fats, limiting salt (sodium) intake, and making other diet and lifestyle changes. What is my plan? Your health care provider may recommend that: You limit your fat intake to _________% or less of your total calories each day. You limit your saturated fat intake to _________% or less of your total calories each day. You limit the amount of cholesterol in your diet to less than _________ mg per day. You limit the amount of sodium in your diet to less than _________  mg per day. What are tips for following this plan? Cooking Cook foods using methods other than frying. Baking, boiling, grilling, and broiling are all good options. Other ways to reduce fat include: Removing the skin from poultry. Removing all visible fats from meats. Steaming vegetables in water or broth. Meal planning  At meals, imagine dividing your plate into fourths: Fill one-half of your plate with vegetables and green salads. Fill one-fourth of your plate with whole grains. Fill one-fourth of your plate with lean protein foods. Eat 2-4 cups of vegetables per day. One cup of vegetables equals 1 cup (91 g) broccoli or cauliflower florets, 2 medium carrots, 1 large bell pepper, 1 large sweet potato, 1 large tomato, 1 medium white potato, 2 cups (150 g) raw leafy greens. Eat 1-2 cups of fruit per day. One cup of fruit equals 1 small apple, 1 large banana, 1 cup (237 g) mixed fruit, 1 large orange,  cup (82 g) dried fruit, 1 cup (240 mL) 100% fruit juice. Eat more foods that contain soluble fiber. Examples include apples, broccoli, carrots, beans, peas, and barley. Aim to get 25-30 g of fiber per day. Increase your consumption of legumes, nuts, and seeds to 4-5 servings per week. One serving of dried beans or legumes equals  cup (90 g) cooked, 1 serving of nuts is  oz (12 almonds, 24 pistachios, or 7 walnut halves), and 1 serving of seeds equals  oz (8 g). Fats Choose healthy fats more often. Choose monounsaturated and polyunsaturated fats, such as olive and canola oils, avocado oil, flaxseeds, walnuts, almonds, and seeds. Eat  more omega-3 fats. Choose salmon, mackerel, sardines, tuna, flaxseed oil, and ground flaxseeds. Aim to eat fish at least 2 times each week. Check food labels carefully to identify foods with trans fats or high amounts of saturated fat. Limit saturated fats. These are found in animal products, such as meats, butter, and cream. Plant sources of saturated fats  include palm oil, palm kernel oil, and coconut oil. Avoid foods with partially hydrogenated oils in them. These contain trans fats. Examples are stick margarine, some tub margarines, cookies, crackers, and other baked goods. Avoid fried foods. General information Eat more home-cooked food and less restaurant, buffet, and fast food. Limit or avoid alcohol. Limit foods that are high in added sugar and simple starches such as foods made using white refined flour (white breads, pastries, sweets). Lose weight if you are overweight. Losing just 5-10% of your body weight can help your overall health and prevent diseases such as diabetes and heart disease. Monitor your sodium intake, especially if you have high blood pressure. Talk with your health care provider about your sodium intake. Try to incorporate more vegetarian meals weekly. What foods should I eat? Fruits All fresh, canned (in natural juice), or frozen fruits. Vegetables Fresh or frozen vegetables (raw, steamed, roasted, or grilled). Green salads. Grains Most grains. Choose whole wheat and whole grains most of the time. Rice and pasta, including brown rice and pastas made with whole wheat. Meats and other proteins Lean, well-trimmed beef, veal, pork, and lamb. Chicken and [place] without skin. All fish and shellfish. Wild duck, rabbit, pheasant, and venison. Egg whites or low-cholesterol egg substitutes. Dried beans, peas, lentils, and tofu. Seeds and most nuts. Dairy Low-fat or nonfat cheeses, including ricotta and mozzarella. Skim or 1% milk (liquid, powdered, or evaporated). Buttermilk made with low-fat milk. Nonfat or low-fat yogurt. Fats and oils Non-hydrogenated (trans-free) margarines. Vegetable oils, including soybean, sesame, sunflower, olive, avocado, peanut, safflower, corn, canola, and cottonseed. Salad dressings or mayonnaise made with a vegetable oil. Beverages Water (mineral or sparkling). Coffee and tea. Unsweetened ice  tea. Diet beverages. Sweets and desserts Sherbet, gelatin, and fruit ice. Small amounts of dark chocolate. Limit all sweets and desserts. Seasonings and condiments All seasonings and condiments. The items listed above may not be a complete list of foods and beverages you can eat. Contact a dietitian for more options. What foods should I avoid? Fruits Canned fruit in heavy syrup. Fruit in cream or butter sauce. Fried fruit. Limit coconut. Vegetables Vegetables cooked in cheese, cream, or butter sauce. Fried vegetables. Grains Breads made with saturated or trans fats, oils, or whole milk. Croissants. Sweet rolls. Donuts. High-fat crackers, such as cheese crackers and chips. Meats and other proteins Fatty meats, such as hot dogs, ribs, sausage, bacon, rib-eye roast or steak. High-fat deli meats, such as salami and bologna. Caviar. Domestic duck and goose. Organ meats, such as liver. Dairy Cream, sour cream, cream cheese, and creamed cottage cheese. Whole-milk cheeses. Whole or 2% milk (liquid, evaporated, or condensed). Whole buttermilk. Cream sauce or high-fat cheese sauce. Whole-milk yogurt. Fats and oils Meat fat, or shortening. Cocoa butter, hydrogenated oils, palm oil, coconut oil, palm kernel oil. Solid fats and shortenings, including bacon fat, salt pork, lard, and butter. Nondairy cream substitutes. Salad dressings with cheese or sour cream. Beverages Regular sodas and any drinks with added sugar. Sweets and desserts Frosting. Pudding. Cookies. Cakes. Pies. Milk chocolate or white chocolate. Buttered syrups. Full-fat ice cream or ice cream drinks. The items listed above may  not be a complete list of foods and beverages to avoid. Contact a dietitian for more information. Summary Heart-healthy meal planning includes limiting unhealthy fats, increasing healthy fats, limiting salt (sodium) intake and making other diet and lifestyle changes. Lose weight if you are overweight. Losing just  5-10% of your body weight can help your overall health and prevent diseases such as diabetes and heart disease. Focus on eating a balance of foods, including fruits and vegetables, low-fat or nonfat dairy, lean protein, nuts and legumes, whole grains, and heart-healthy oils and fats. This information is not intended to replace advice given to you by your health care provider. Make sure you discuss any questions you have with your health care provider. Document Revised: 09/24/2021 Document Reviewed: 09/24/2021 Elsevier Patient Education  2024 [employer].

## 2024-02-06 ENCOUNTER — Encounter: Payer: Self-pay | Admitting: Nurse Practitioner

## 2024-02-06 ENCOUNTER — Ambulatory Visit (INDEPENDENT_AMBULATORY_CARE_PROVIDER_SITE_OTHER): Admitting: Nurse Practitioner

## 2024-02-06 VITALS — BP 133/66 | HR 82 | Temp 97.9°F | Ht 60.0 in | Wt 100.2 lb

## 2024-02-06 DIAGNOSIS — E782 Mixed hyperlipidemia: Secondary | ICD-10-CM

## 2024-02-06 DIAGNOSIS — I6523 Occlusion and stenosis of bilateral carotid arteries: Secondary | ICD-10-CM

## 2024-02-06 DIAGNOSIS — Z9049 Acquired absence of other specified parts of digestive tract: Secondary | ICD-10-CM

## 2024-02-06 DIAGNOSIS — E440 Moderate protein-calorie malnutrition: Secondary | ICD-10-CM | POA: Diagnosis not present

## 2024-02-06 DIAGNOSIS — J432 Centrilobular emphysema: Secondary | ICD-10-CM | POA: Diagnosis not present

## 2024-02-06 DIAGNOSIS — D508 Other iron deficiency anemias: Secondary | ICD-10-CM

## 2024-02-06 DIAGNOSIS — E039 Hypothyroidism, unspecified: Secondary | ICD-10-CM

## 2024-02-06 DIAGNOSIS — I700 Atherosclerosis of aorta: Secondary | ICD-10-CM | POA: Diagnosis not present

## 2024-02-06 MED ORDER — MIRTAZAPINE 15 MG PO TABS: 15.0000 mg | ORAL_TABLET | Freq: Every day | ORAL | 4 refills | Status: AC

## 2024-02-06 MED ORDER — IRON-VITAMIN C 65-125 MG PO TABS: 1.0000 | ORAL_TABLET | Freq: Two times a day (BID) | ORAL | 4 refills | Status: AC

## 2024-02-06 NOTE — Assessment & Plan Note (Signed)
Chronic, stable.  Noted on past imaging in 2017.  Recommend complete cessation of smoking.  Continue statin at low dose daily and Baby ASA for preventative care.

## 2024-02-06 NOTE — Assessment & Plan Note (Signed)
 Ongoing, stable, continues on Vitron with benefit.  Will continue this regimen and obtain labs annually.

## 2024-02-06 NOTE — Assessment & Plan Note (Addendum)
 Ongoing issue, post lung CA initially years ago.  Some loss this check.  Will continue Mirtazapine  as currently prescribed.  This is offering benefit to her appetite.  Eating three meals a day and snacks.  Check CMP today.  Monitor weight closely.  She wishes to come and see PCP once a year since sees pulmonary every 6 months, we discussed at length and came to agreeance to trial this and she will monitor weight at home.  Suspect 125 lbs weight in hospital is incorrect, as she reports they did not weigh her.  Suspect last weight was 105 lbs, meaning 5 lbs lost since surgery.

## 2024-02-06 NOTE — Assessment & Plan Note (Signed)
 Chronic, ongoing.  Continue current medication regimen and adjust as needed. Lipid panel today.

## 2024-02-06 NOTE — Assessment & Plan Note (Signed)
 Chronic, ongoing.  Followed by pulmonary, continue this collaboration.  Taking Stiolto with benefit, no recent use of Ventolin .  Recent pulmonary notes reviewed.  She wishes to come and see PCP once a year since sees pulmonary every 6 months, we discussed at length and came to agreeance to trial this and she will monitor weight at home.

## 2024-02-06 NOTE — Progress Notes (Addendum)
 BP 133/66   Pulse 82   Temp 97.9 F (36.6 C) (Oral)   Ht 5' (1.524 m)   Wt 100 lb 3.2 oz (45.5 kg)   SpO2 97%   BMI 19.57 kg/m    Subjective:    Patient ID: Lori Rangel, female    DOB: February 28, 1961, 63 y.o.   MRN: [id number]  HPI: Lori Rangel is a 63 y.o. 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 Viva Grise, with last visit on 12/04/23.  Had radiation therapy to nodule with last treatment 05/14/22.  Saw oncology last 11/03/23. History of lung cancer 2019 and 2023.  History of thoracic aortic atherosclerosis noted on imaging. She quit smoking > 1 year ago. Used to smoke 1 1/2 PPD to 2 PPD. Started smoking at age 13.  She smoked for 49 years. COPD status: stable Satisfied with current treatment?: yes Oxygen use: no Dyspnea frequency: no Cough frequency: no Rescue inhaler frequency: only if in the yard Limitation of activity: no Productive cough:  none Last Spirometry: with pulmonary Pneumovax:  Up To Date Influenza: yes  HYPERLIPIDEMIA Continues on Crestor  10 MG daily.   Hyperlipidemia status: good compliance Satisfied with current treatment?  yes Side effects:  no Medication compliance: good compliance Supplements: none Aspirin :  yes The 10-year ASCVD risk score (Arnett DK, et al., 2019) is: 5%   Values used to calculate the score:     Age: 13 years     Sex: Female     Is Non-Hispanic African American: No     Diabetic: No     Tobacco smoker: No     Systolic Blood Pressure: 133 mmHg     Is BP treated: No     HDL Cholesterol: 51 mg/dL     Total Cholesterol: 164 mg/dL Chest pain:  no Coronary artery disease:  no Family history CAD:  yes in aunt Family history early CAD:  no   HYPOTHYROIDISM Continues on Levothyroxine  25 MCG daily.  Continues Mirtazapine  for weight, no further Marinol  as could not obtain (this was  started by [facility] oncology in past).   Thyroid control status:stable Satisfied with current treatment? yes Medication side effects: no Medication compliance: good compliance Etiology of hypothyroidism:  Recent dose adjustment:no Fatigue: no Cold intolerance: no Heat intolerance: no Weight gain: no Weight loss: no Constipation: no Diarrhea/loose stools: no Palpitations: no Lower extremity edema: no Anxiety/depressed mood: no      02/06/2024    1:59 PM 12/17/2023   11:30 AM 10/14/2023    1:13 PM 03/21/2023    2:17 PM 12/17/2022    4:28 PM  Depression screen PHQ 2/9  Decreased Interest 0 0 0 0 0  Down, Depressed, Hopeless 0 0 0 0 0  PHQ - 2 Score 0 0 0 0 0  Altered sleeping 0 0  0 0  Tired, decreased energy 0 0  0 0  Change in appetite 0 0  0 0  Feeling bad or failure about yourself  0 0  0 0  Trouble concentrating 0 0  0 0  Moving slowly or fidgety/restless 0 0  0 0  Suicidal thoughts 0 0  0 0  PHQ-9 Score 0 0  0 0  Difficult doing work/chores Not difficult at all Not difficult at all  Not difficult at all  02/06/2024    1:59 PM 12/17/2023   11:31 AM 03/21/2023    2:17 PM 12/17/2022    4:28 PM  GAD 7 : Generalized Anxiety Score  Nervous, Anxious, on Edge 0 0 0 0  Control/stop worrying 0 1 0 0  Worry too much - different things 0 1 0 0  Trouble relaxing 0 0 0 3  Restless 0 0 0 0  Easily annoyed or irritable 0 0 0 0  Afraid - awful might happen 0 0 0 0  Total GAD 7 Score 0 2 0 3  Anxiety Difficulty Not difficult at all Not difficult at all Not difficult at all    Relevant past medical, surgical, family and social history reviewed and updated as indicated. Interim medical history since our last visit reviewed. Allergies and medications reviewed and updated.  Review of Systems  Constitutional:  Negative for activity change, appetite change, diaphoresis, fatigue and fever.  Respiratory:  Negative for cough, chest tightness, shortness of breath and wheezing.    Cardiovascular:  Negative for chest pain, palpitations and leg swelling.  Gastrointestinal: Negative.   Endocrine: Negative for cold intolerance and heat intolerance.  Genitourinary: Negative.   Neurological: Negative.   Psychiatric/Behavioral: Negative.      Per HPI unless specifically indicated above     Objective:    BP 133/66   Pulse 82   Temp 97.9 F (36.6 C) (Oral)   Ht 5' (1.524 m)   Wt 100 lb 3.2 oz (45.5 kg)   SpO2 97%   BMI 19.57 kg/m   Wt Readings from Last 3 Encounters:  02/06/24 100 lb 3.2 oz (45.5 kg)  01/08/24 125 lb (56.7 kg)  12/23/23 105 lb 12.8 oz (48 kg)    Physical Exam Vitals and nursing note reviewed.  Constitutional:      General: She is awake. She is not in acute distress.    Appearance: She is well-developed, well-groomed and underweight. She is not ill-appearing or toxic-appearing.  HENT:     Head: Normocephalic.     Right Ear: Hearing and external ear normal.     Left Ear: Hearing and external ear normal.  Eyes:     General: Lids are normal.        Right eye: No discharge.        Left eye: No discharge.     Conjunctiva/sclera: Conjunctivae normal.     Pupils: Pupils are equal, round, and reactive to light.  Neck:     Thyroid: No thyromegaly.     Vascular: No carotid bruit.  Cardiovascular:     Rate and Rhythm: Normal rate and regular rhythm.     Heart sounds: Normal heart sounds. No murmur heard.    No gallop.  Pulmonary:     Effort: Pulmonary effort is normal. No accessory muscle usage or respiratory distress.     Breath sounds: Normal breath sounds.  Abdominal:     General: Bowel sounds are normal. There is no distension.     Palpations: Abdomen is soft.     Tenderness: There is no abdominal tenderness.  Musculoskeletal:     Cervical back: Normal range of motion and neck supple.     Right lower leg: No edema.     Left lower leg: No edema.  Lymphadenopathy:     Cervical: No cervical adenopathy.  Skin:    General: Skin is  warm and dry.  Neurological:     Mental Status: She is alert and oriented to person,  place, and time.     Deep Tendon Reflexes: Reflexes are normal and symmetric.     Reflex Scores:      Brachioradialis reflexes are 2+ on the right side and 2+ on the left side.      Patellar reflexes are 2+ on the right side and 2+ on the left side. Psychiatric:        Attention and Perception: Attention normal.        Mood and Affect: Mood normal.        Speech: Speech normal.        Behavior: Behavior normal. Behavior is cooperative.        Thought Content: Thought content normal.    Results for orders placed or performed during the hospital encounter of 01/08/24  Lipase, blood   Collection Time: 01/08/24  7:22 AM  Result Value Ref Range   Lipase 1,751 (H) 11 - 51 U/L  Comprehensive metabolic panel   Collection Time: 01/08/24  7:22 AM  Result Value Ref Range   Sodium 137 135 - 145 mmol/L   Potassium 4.0 3.5 - 5.1 mmol/L   Chloride 100 98 - 111 mmol/L   CO2 26 22 - 32 mmol/L   Glucose, Bld 155 (H) 70 - 99 mg/dL   BUN 10 8 - 23 mg/dL   Creatinine, Ser 1.91 0.44 - 1.00 mg/dL   Calcium  9.2 8.9 - 10.3 mg/dL   Total Protein 7.7 6.5 - 8.1 g/dL   Albumin 4.2 3.5 - 5.0 g/dL   AST 478 (H) 15 - 41 U/L   ALT 502 (H) 0 - 44 U/L   Alkaline Phosphatase 174 (H) 38 - 126 U/L   Total Bilirubin 1.5 (H) 0.0 - 1.2 mg/dL   GFR, Estimated >29 >56 mL/min   Anion gap 11 5 - 15  CBC   Collection Time: 01/08/24  7:22 AM  Result Value Ref Range   WBC 5.0 4.0 - 10.5 K/uL   RBC 4.33 3.87 - 5.11 MIL/uL   Hemoglobin 13.5 12.0 - 15.0 g/dL   HCT 21.3 08.6 - 57.8 %   MCV 93.3 80.0 - 100.0 fL   MCH 31.2 26.0 - 34.0 pg   MCHC 33.4 30.0 - 36.0 g/dL   RDW 46.9 62.9 - 52.8 %   Platelets 259 150 - 400 K/uL   nRBC 0.0 0.0 - 0.2 %  Urinalysis, Routine w reflex microscopic -Urine, Clean Catch   Collection Time: 01/08/24  7:22 AM  Result Value Ref Range   Color, Urine YELLOW (A) YELLOW   APPearance CLEAR (A) CLEAR    Specific Gravity, Urine 1.013 1.005 - 1.030   pH 8.0 5.0 - 8.0   Glucose, UA NEGATIVE NEGATIVE mg/dL   Hgb urine dipstick NEGATIVE NEGATIVE   Bilirubin Urine NEGATIVE NEGATIVE   Ketones, ur NEGATIVE NEGATIVE mg/dL   Protein, ur NEGATIVE NEGATIVE mg/dL   Nitrite NEGATIVE NEGATIVE   Leukocytes,Ua NEGATIVE NEGATIVE  HIV Antibody (routine testing w rflx)   Collection Time: 01/08/24  8:31 PM  Result Value Ref Range   HIV Screen 4th Generation wRfx Non Reactive Non Reactive  Surgical pathology   Collection Time: 01/09/24 12:00 AM  Result Value Ref Range   SURGICAL PATHOLOGY      SURGICAL PATHOLOGY [facility] [address] [place], [place] [address] Telephone [phone] or [phone] Fax [phone]  REPORT OF SURGICAL PATHOLOGY   Accession #: [id number] Patient Name: Lori Rangel, Lori Rangel Visit # : [id number]  MRN: [id number] Physician: Severa Daniels DOB/Age 11-06-1960 (Age: 83) Gender: F Collected Date: 01/09/2024 Received Date: 01/12/2024  FINAL DIAGNOSIS       1. Gallbladder,  :       - ACUTE CHOLECYSTITIS IN A BACKGROUND OF CHRONIC CHOLECYSTITIS       DATE SIGNED OUT: 01/13/2024 ELECTRONIC SIGNATURE : Bernetta Brilliant Md, Nilesh, Pathologist, Electronic Signature  MICROSCOPIC DESCRIPTION  CASE COMMENTS STAINS USED IN DIAGNOSIS: H&E    CLINICAL HISTORY  SPECIMEN(S) OBTAINED 1. Gallbladder,  SPECIMEN COMMENTS: SPECIMEN CLINICAL INFORMATION: 1. Biliary colic.  Acute obconic cholecystitis    Gross Description 1. Size/?Intact: 10.1 x 3.1 cm      Serosal surface: Gray-tan and wrinkled wit h a mild amount of adipose tissue and      a mildly roughened hepatic bed.      Mucosa/Wall: Tan-brown and velvety mucosa; 0.2-0.4 cm thick wall. Discrete      lesions are not identified.      Contents: Absent.      Cystic duct: 0.5 cm diameter, patent; received without a clamp; inked black.      Block Summary: Representative sections  including the cystic duct margin (en      face) are submitted in 1 block (1A).      AMG 01/12/2024        Report signed out from the following location(s) [place]. [facility] [address] [place], [place] [address] CLIA #: [id number]  [facility] [address] [place], [place] [address] CLIA #: [id number]   Comprehensive metabolic panel   Collection Time: 01/09/24  7:00 AM  Result Value Ref Range   Sodium 138 135 - 145 mmol/L   Potassium 3.7 3.5 - 5.1 mmol/L   Chloride 108 98 - 111 mmol/L   CO2 22 22 - 32 mmol/L   Glucose, Bld 84 70 - 99 mg/dL   BUN 7 (L) 8 - 23 mg/dL   Creatinine, Ser 0.10 0.44 - 1.00 mg/dL   Calcium  8.5 (L) 8.9 - 10.3 mg/dL   Total Protein 5.9 (L) 6.5 - 8.1 g/dL   Albumin 3.3 (L) 3.5 - 5.0 g/dL   AST 272 (H) 15 - 41 U/L   ALT 230 (H) 0 - 44 U/L   Alkaline Phosphatase 150 (H) 38 - 126 U/L   Total Bilirubin 1.2 0.0 - 1.2 mg/dL   GFR, Estimated >53 >66 mL/min   Anion gap 8 5 - 15  CBC with Differential/Platelet   Collection Time: 01/09/24  7:00 AM  Result Value Ref Range   WBC 3.9 (L) 4.0 - 10.5 K/uL   RBC 3.87 3.87 - 5.11 MIL/uL   Hemoglobin 12.0 12.0 - 15.0 g/dL   HCT 44.0 (L) 34.7 - 42.5 %   MCV 91.7 80.0 - 100.0 fL   MCH 31.0 26.0 - 34.0 pg   MCHC 33.8 30.0 - 36.0 g/dL   RDW 95.6 38.7 - 56.4 %   Platelets 199 150 - 400 K/uL   nRBC 0.0 0.0 - 0.2 %   Neutrophils Relative % 56 %   Neutro Abs 2.2 1.7 - 7.7 K/uL   Lymphocytes Relative 36 %   Lymphs Abs 1.4 0.7 - 4.0 K/uL   Monocytes Relative 6 %   Monocytes Absolute 0.2 0.1 - 1.0 K/uL   Eosinophils Relative 1 %   Eosinophils Absolute 0.0 0.0 - 0.5 K/uL   Basophils Relative 1 %   Basophils Absolute 0.0 0.0 - 0.1 K/uL   Immature  Granulocytes 0 %   Abs Immature Granulocytes 0.01 0.00 - 0.07 K/uL  CBC with Differential/Platelet   Collection Time: 01/10/24  7:35 AM  Result Value Ref Range   WBC 11.9 (H) 4.0 - 10.5 K/uL   RBC 4.11 3.87 - 5.11 MIL/uL   Hemoglobin  12.6 12.0 - 15.0 g/dL   HCT 41.3 24.4 - 01.0 %   MCV 92.0 80.0 - 100.0 fL   MCH 30.7 26.0 - 34.0 pg   MCHC 33.3 30.0 - 36.0 g/dL   RDW 27.2 53.6 - 64.4 %   Platelets 218 150 - 400 K/uL   nRBC 0.0 0.0 - 0.2 %   Neutrophils Relative % 87 %   Neutro Abs 10.4 (H) 1.7 - 7.7 K/uL   Lymphocytes Relative 9 %   Lymphs Abs 1.0 0.7 - 4.0 K/uL   Monocytes Relative 4 %   Monocytes Absolute 0.5 0.1 - 1.0 K/uL   Eosinophils Relative 0 %   Eosinophils Absolute 0.0 0.0 - 0.5 K/uL   Basophils Relative 0 %   Basophils Absolute 0.0 0.0 - 0.1 K/uL   Immature Granulocytes 0 %   Abs Immature Granulocytes 0.05 0.00 - 0.07 K/uL  Comprehensive metabolic panel   Collection Time: 01/10/24  7:35 AM  Result Value Ref Range   Sodium 136 135 - 145 mmol/L   Potassium 3.9 3.5 - 5.1 mmol/L   Chloride 102 98 - 111 mmol/L   CO2 24 22 - 32 mmol/L   Glucose, Bld 115 (H) 70 - 99 mg/dL   BUN 8 8 - 23 mg/dL   Creatinine, Ser 0.34 0.44 - 1.00 mg/dL   Calcium  8.7 (L) 8.9 - 10.3 mg/dL   Total Protein 6.4 (L) 6.5 - 8.1 g/dL   Albumin 3.3 (L) 3.5 - 5.0 g/dL   AST 83 (H) 15 - 41 U/L   ALT 160 (H) 0 - 44 U/L   Alkaline Phosphatase 122 38 - 126 U/L   Total Bilirubin 1.0 0.0 - 1.2 mg/dL   GFR, Estimated >74 >25 mL/min   Anion gap 10 5 - 15      Assessment & Plan:   Problem List Items Addressed This Visit       Cardiovascular and Mediastinum   Thoracic aortic atherosclerosis (HCC)   Chronic, stable.  Noted on past imaging in 2017.  Recommend complete cessation of smoking.  Continue statin at low dose daily and Baby ASA for preventative care.      Relevant Orders   Comprehensive metabolic panel with GFR   Lipid Panel w/o Chol/HDL Ratio     Respiratory   COPD with emphysema (HCC) - Primary   Chronic, ongoing.  Followed by pulmonary, continue this collaboration.  Taking Stiolto with benefit, no recent use of Ventolin .  Recent pulmonary notes reviewed.  She wishes to come and see PCP once a year since sees  pulmonary every 6 months, we discussed at length and came to agreeance to trial this and she will monitor weight at home.      Relevant Orders   CBC with Differential/Platelet     Endocrine   Hypothyroidism   Chronic, ongoing.  Will continue current regimen at this time and adjust as needed based on labs.  TSH and Free T4 on labs today.      Relevant Orders   T4, free   TSH     Other   Moderate protein-calorie malnutrition (HCC)   Ongoing issue, post lung CA initially years ago.  Some loss this check.  Will continue Mirtazapine  as currently prescribed.  This is offering benefit to her appetite.  Eating three meals a day and snacks.  Check CMP today.  Monitor weight closely.  She wishes to come and see PCP once a year since sees pulmonary every 6 months, we discussed at length and came to agreeance to trial this and she will monitor weight at home.  Suspect 125 lbs weight in hospital is incorrect, as she reports they did not weigh her.  Suspect last weight was 105 lbs, meaning 5 lbs lost since surgery.      Mixed hyperlipidemia   Chronic, ongoing.  Continue current medication regimen and adjust as needed.  Lipid panel today.      Relevant Orders   Comprehensive metabolic panel with GFR   Lipid Panel w/o Chol/HDL Ratio   Iron  deficiency anemia secondary to inadequate dietary iron  intake   Ongoing, stable, continues on Vitron with benefit.  Will continue this regimen and obtain labs annually.      Relevant Medications   Iron -Vitamin C  65-125 MG TABS   History of cholecystectomy   On 01/09/24, overall feeling much better and wounds are healing.      Relevant Orders   Lipase   Amylase     Follow up plan: Return in about 1 year (around 02/05/2025) for Annual Physical.

## 2024-02-06 NOTE — Assessment & Plan Note (Signed)
 Chronic, ongoing.  Will continue current regimen at this time and adjust as needed based on labs.  TSH and Free T4 on labs today.

## 2024-02-06 NOTE — Assessment & Plan Note (Signed)
 On 01/09/24, overall feeling much better and wounds are healing.

## 2024-02-07 LAB — CBC WITH DIFFERENTIAL/PLATELET
Basophils Absolute: 0.1 10*3/uL (ref 0.0–0.2)
Basos: 1 %
EOS (ABSOLUTE): 0.1 10*3/uL (ref 0.0–0.4)
Eos: 1 %
Hematocrit: 37.5 % (ref 34.0–46.6)
Hemoglobin: 12.1 g/dL (ref 11.1–15.9)
Immature Grans (Abs): 0 10*3/uL (ref 0.0–0.1)
Immature Granulocytes: 0 %
Lymphocytes Absolute: 2 10*3/uL (ref 0.7–3.1)
Lymphs: 36 %
MCH: 30.5 pg (ref 26.6–33.0)
MCHC: 32.3 g/dL (ref 31.5–35.7)
MCV: 95 fL (ref 79–97)
Monocytes Absolute: 0.3 10*3/uL (ref 0.1–0.9)
Monocytes: 5 %
Neutrophils Absolute: 3.2 10*3/uL (ref 1.4–7.0)
Neutrophils: 57 %
Platelets: 254 10*3/uL (ref 150–450)
RBC: 3.97 x10E6/uL (ref 3.77–5.28)
RDW: 12.1 % (ref 11.7–15.4)
WBC: 5.6 10*3/uL (ref 3.4–10.8)

## 2024-02-07 LAB — LIPID PANEL W/O CHOL/HDL RATIO
Cholesterol, Total: 246 mg/dL — ABNORMAL HIGH (ref 100–199)
HDL: 51 mg/dL (ref >39)
LDL Chol Calc (NIH): 158 mg/dL — ABNORMAL HIGH (ref 0–99)
Triglycerides: 205 mg/dL — ABNORMAL HIGH (ref 0–149)
VLDL Cholesterol Cal: 37 mg/dL (ref 5–40)

## 2024-02-07 LAB — COMPREHENSIVE METABOLIC PANEL WITH GFR
ALT: 15 IU/L (ref 0–32)
AST: 24 IU/L (ref 0–40)
Albumin: 4.3 g/dL (ref 3.9–4.9)
Alkaline Phosphatase: 117 IU/L (ref 44–121)
BUN/Creatinine Ratio: 9 — ABNORMAL LOW (ref 12–28)
BUN: 7 mg/dL — ABNORMAL LOW (ref 8–27)
Bilirubin Total: 0.2 mg/dL (ref 0.0–1.2)
CO2: 22 mmol/L (ref 20–29)
Calcium: 9.2 mg/dL (ref 8.7–10.3)
Chloride: 101 mmol/L (ref 96–106)
Creatinine, Ser: 0.78 mg/dL (ref 0.57–1.00)
Globulin, Total: 2.6 g/dL (ref 1.5–4.5)
Glucose: 89 mg/dL (ref 70–99)
Potassium: 4.1 mmol/L (ref 3.5–5.2)
Sodium: 140 mmol/L (ref 134–144)
Total Protein: 6.9 g/dL (ref 6.0–8.5)
eGFR: 85 mL/min/{1.73_m2} (ref >59)

## 2024-02-07 LAB — LIPASE: Lipase: 41 U/L (ref 14–72)

## 2024-02-07 LAB — T4, FREE: Free T4: 0.71 ng/dL — ABNORMAL LOW (ref 0.82–1.77)

## 2024-02-07 LAB — AMYLASE: Amylase: 116 U/L — ABNORMAL HIGH (ref 31–110)

## 2024-02-07 LAB — TSH: TSH: 1.4 u[IU]/mL (ref 0.450–4.500)

## 2024-02-08 ENCOUNTER — Ambulatory Visit: Payer: Self-pay | Admitting: Nurse Practitioner

## 2024-02-08 NOTE — Progress Notes (Signed)
 Contacted via MyChart   Good morning Mehr, your labs have returned: - Kidney function, creatinine and eGFR, remains normal, as is liver function, AST and ALT.  - Pancreas labs show some elevation in Amylase, but lipase is normal.  Suspect overtime all of this will return to normal now that gall bladder is out. - CBC shows no anemia or infection. - Thyroid labs overall stable, continue Levothyroxine  as ordered. - Lipid panel is showing elevations.  Are you taking your Rosuvastatin  as ordered? Please let me know as we may need to increase dose if you are.  Any questions? Keep being stellar!!  Thank you for allowing me to participate in your care.  I appreciate you. Kindest regards, Vienne Corcoran

## 2024-03-26 ENCOUNTER — Other Ambulatory Visit: Payer: Self-pay | Admitting: Nurse Practitioner

## 2024-03-26 ENCOUNTER — Encounter: Payer: Self-pay | Admitting: Nurse Practitioner

## 2024-03-26 MED ORDER — ALBUTEROL SULFATE HFA 108 (90 BASE) MCG/ACT IN AERS: 1.0000 | INHALATION_SPRAY | RESPIRATORY_TRACT | 0 refills | Status: DC | PRN

## 2024-04-05 ENCOUNTER — Encounter: Payer: Self-pay | Admitting: Radiation Oncology

## 2024-06-08 ENCOUNTER — Ambulatory Visit: Admitting: Pulmonary Disease

## 2024-06-08 ENCOUNTER — Encounter: Payer: Self-pay | Admitting: Pulmonary Disease

## 2024-06-08 VITALS — BP 120/70 | HR 77 | Ht 60.0 in | Wt 104.0 lb

## 2024-06-08 DIAGNOSIS — Z23 Encounter for immunization: Secondary | ICD-10-CM | POA: Diagnosis not present

## 2024-06-08 DIAGNOSIS — Z85118 Personal history of other malignant neoplasm of bronchus and lung: Secondary | ICD-10-CM

## 2024-06-08 DIAGNOSIS — Z87891 Personal history of nicotine dependence: Secondary | ICD-10-CM

## 2024-06-08 DIAGNOSIS — J449 Chronic obstructive pulmonary disease, unspecified: Secondary | ICD-10-CM | POA: Diagnosis not present

## 2024-06-08 MED ORDER — TIOTROPIUM BROMIDE-OLODATEROL 2.5-2.5 MCG/ACT IN AERS: 2.0000 | INHALATION_SPRAY | Freq: Every day | RESPIRATORY_TRACT | 11 refills | Status: AC

## 2024-06-08 MED ORDER — VENTOLIN HFA 108 (90 BASE) MCG/ACT IN AERS: 2.0000 | INHALATION_SPRAY | Freq: Four times a day (QID) | RESPIRATORY_TRACT | 4 refills | Status: AC | PRN

## 2024-06-08 NOTE — Progress Notes (Unsigned)
 Subjective:    Patient ID: Lori Rangel, female    DOB: 04-27-1961, 63 y.o.   MRN: [id number]  Patient Care Team: Valerio Melanie DASEN, NP as PCP - General (Nurse Practitioner) Perla Evalene JINNY, MD as Consulting Physician (Cardiology) Lenn Aran, MD as Consulting Physician (Radiation Oncology) Tamea Dedra CROME, MD as Consulting Physician (Pulmonary Disease)  Chief Complaint  Patient presents with   COPD    BACKGROUND/INTERVAL:Patient is a 63 year old former smoker (100 PY) with a history as noted below, who presents for follow-up of a right lung nodule and severe COPD.  Recall that in 2017 the patient developed a left upper lobe nodule, she underwent electromagnetic navigational bronchoscopy at [facility] by Dr. Isaiah and this was nondiagnostic.  Subsequently she was referred to thoracic surgery at [facility] where she underwent evaluation with percutaneous needle biopsy which resulted in pneumothorax.  She subsequently underwent referral to radiation oncology as she was not an operative candidate.  Per the family's information, the patient did have recurrence after initial radiation and she required a second round of radiation to the left lung.  That left lung has significant volume loss due to postradiation changes. [name] primary care practitioner Jolene Cannady, NP ordered a CT scan of the chest performed on 28 March 2022, which has shown a nodule on the right lung that is 12 mm in diameter.  The patient then underwent PET/CT on 12 April 2022 which showed FDG activity on the nodule in question.  There was no other activity noted elsewhere.  Because she was not a surgical candidate nor candidate for invasive procedures.  She was referred to Dr. Aran Lenn for SBRT and she underwent 5 treatments of SBRT to this nodule.  She has had a follow-up CT chest on 30 October 2023 showed no evidence of recurrent or metastatic disease. She was last seen here on 04 December 2023 follows here mostly for  issues with COPD.  This is a routine follow-up visit.    HPI Discussed the use of AI scribe software for clinical note transcription with the patient, who gave verbal consent to proceed.  History of Present Illness   Lori Rangel is a 63 year old female with asthma who presents for a follow-up visit regarding her COPD.  She presents with her daughter Devere.  She uses her inhaler daily and finds it effective, especially after exposure to warm weather, which makes outdoor activities challenging.  She needs refills on both her Stiolto and her as needed albuterol .  She occasionally uses a rescue inhaler and anticipates needing a refill soon.  No recent cough, wheezing, or shortness of breath.  No sleep disturbance.  No lower extremity edema.  No orthopnea or paroxysmal nocturnal dyspnea.      Review of Systems A 10 point review of systems was performed and it is as noted above otherwise negative.   Patient Active Problem List   Diagnosis Date Noted   History of cholecystectomy 01/08/2024   GERD without esophagitis 09/19/2021   Mixed hyperlipidemia 03/09/2021   Iron  deficiency anemia secondary to inadequate dietary iron  intake 07/12/2020   Hypothyroidism 07/12/2020   Medication monitoring encounter 07/12/2020   History of lung cancer 07/07/2020   History of alcohol abuse 10/09/2018   Uses walker 06/03/2018   History of fracture of right hip 11/03/2017   Thoracic aortic atherosclerosis 06/07/2016   COPD with emphysema (HCC) 01/25/2016   Carotid stenosis 12/31/2015   History of cigarette smoking 12/31/2015   Right  lower lobe pulmonary nodule 12/31/2015   Moderate protein-calorie malnutrition 12/17/2015    Social History   Tobacco Use   Smoking status: Former    Current packs/day: 0.00    Average packs/day: 2.0 packs/day for 50.0 years (100.0 ttl pk-yrs)    Types: Cigarettes    Start date: 04/03/1972    Quit date: 04/03/2022    Years since quitting: 2.1   Smokeless tobacco:  Never   Tobacco comments:    Quit on 04/03/2022  Substance Use Topics   Alcohol use: Not Currently    Alcohol/week: 0.0 standard drinks of alcohol    Allergies  Allergen Reactions   Levaquin  [Levofloxacin ] Nausea Only    Cramps & Body aches    Current Meds  Medication Sig   acetaminophen  (TYLENOL ) 500 MG tablet Take 500-1,000 mg by mouth every 8 (eight) hours as needed for mild constipation or moderate pain.   cholecalciferol (VITAMIN D3) 25 MCG (1000 UNIT) tablet Take 1,000 Units by mouth daily.   Iron -Vitamin C  65-125 MG TABS Take 1 tablet by mouth in the morning and at bedtime.   levothyroxine  (SYNTHROID ) 25 MCG tablet Take 1 tablet (25 mcg total) by mouth daily before breakfast.   mirtazapine  (REMERON ) 15 MG tablet Take 1 tablet (15 mg total) by mouth at bedtime.   Multiple Vitamin (MULTIVITAMIN WITH MINERALS) TABS tablet Take 1 tablet by mouth daily.   omeprazole  (PRILOSEC) 40 MG capsule Take 1 capsule (40 mg total) by mouth daily.   rosuvastatin  (CRESTOR ) 20 MG tablet Take 1 tablet (20 mg total) by mouth daily.   Tiotropium Bromide-Olodaterol 2.5-2.5 MCG/ACT AERS Inhale 2 puffs into the lungs daily.   VENTOLIN  HFA 108 (90 Base) MCG/ACT inhaler Inhale 2 puffs into the lungs every 6 (six) hours as needed for wheezing or shortness of breath.   [DISCONTINUED] albuterol  (VENTOLIN  HFA) 108 (90 Base) MCG/ACT inhaler Inhale 1-2 puffs into the lungs every 4 (four) hours as needed for wheezing or shortness of breath.   [DISCONTINUED] STIOLTO RESPIMAT  2.5-2.5 MCG/ACT AERS INHALE TWO PUFFS INTO THE LUNGS EVERY DAY    Immunization History  Administered Date(s) Administered   Influenza Inj Mdck Quad Pf 06/03/2018   Influenza Split 07/04/2015   Influenza, Seasonal, Injecte, Preservative Fre 06/08/2024   Influenza,inj,Quad PF,6+ Mos 06/03/2018, 06/15/2019   Influenza-Unspecified 06/06/2016, 06/21/2020, 07/18/2021, 08/22/2022, 07/26/2023   Moderna Sars-Covid-2 Vaccination 05/27/2020,  06/24/2020   PNEUMOCOCCAL CONJUGATE-20 08/25/2023   Pfizer Covid-19 Vaccine Bivalent Booster 4yrs & up 12/23/2020   Pneumococcal Polysaccharide-23 09/08/2020   Td 09/20/2022        Objective:     BP 120/70   Pulse 77   Ht 5' (1.524 m)   Wt 104 lb (47.2 kg)   SpO2 94%   BMI 20.31 kg/m   SpO2: 94 %  GENERAL: Debilitated, frail-appearing woman, looks markedly older than stated age, no acute distress, ambulates with walker. HEAD: Normocephalic, atraumatic.  EYES: Pupils equal, round, reactive to light.  No scleral icterus.  MOUTH: Poor dentition missing teeth lower, teeth in poor repair upper, pharynx clear. NECK: Supple. No thyromegaly. Trachea midline. No JVD.  No adenopathy. PULMONARY: Distant breath sounds bilaterally.  Faint end expiratory wheezes cleared with deep inspiration.  No rhonchi. CARDIOVASCULAR: S1 and S2. Regular rate and rhythm.  ABDOMEN: Benign. MUSCULOSKELETAL: No joint deformity, no clubbing, no edema.  NEUROLOGIC: Unsteady gait, requires walker.  No overt localizing finding. SKIN: Intact,warm,dry. PSYCH: Mood and behavior normal.        Assessment &  Plan:     ICD-10-CM   1. Stage 3 severe COPD by GOLD classification (HCC)  J44.9     2. History of lung cancer - Squamous cell LUL  Z85.118     3. Former smoker  Z87.891     4. Need for influenza vaccination  Z23 Flu vaccine trivalent PF, 6mos and older(Flulaval,Afluria,Fluarix,Fluzone)      Orders Placed This Encounter  Procedures   Flu vaccine trivalent PF, 6mos and older(Flulaval,Afluria,Fluarix,Fluzone)    Meds ordered this encounter  Medications   Tiotropium Bromide-Olodaterol 2.5-2.5 MCG/ACT AERS    Sig: Inhale 2 puffs into the lungs daily.    Dispense:  4 g    Refill:  11   VENTOLIN  HFA 108 (90 Base) MCG/ACT inhaler    Sig: Inhale 2 puffs into the lungs every 6 (six) hours as needed for wheezing or shortness of breath.    Dispense:  18 each    Refill:  4    Discussion:     COPD, stage 3 COPD is well-controlled with current inhaler regimen. No cough, wheezing, or shortness of breath. Occasional use of rescue inhaler, especially post-exertion in warm weather. - Ensure inhaler prescriptions are current. - Provide refills for rescue inhaler as needed.  Influenza immunization Influenza immunization is due. She is agreeable to receiving the flu shot today. - Administer flu shot today.      Advised if symptoms do not improve or worsen, to please contact office for sooner follow up or seek emergency care.    I spent 35 minutes of dedicated to the care of this patient on the date of this encounter to include pre-visit review of records, face-to-face time with the patient discussing conditions above, post visit ordering of testing, clinical documentation with the electronic health record, making appropriate referrals as documented, and communicating necessary findings to members of the patients care team.     C. Leita Sanders, MD Advanced Bronchoscopy P[facility]    *This note was generated using voice recognition software/Dragon and/or AI transcription program.  Despite best efforts to proofread, errors can occur which can change the meaning. Any transcriptional errors that result from this process are unintentional and may not be fully corrected at the time of dictation.

## 2024-06-08 NOTE — Patient Instructions (Addendum)
 VISIT SUMMARY:  Today, you came in for a follow-up visit regarding your COPD and inhaler use. You mentioned that you use your inhaler daily and find it effective, especially after exposure to warm weather. You also occasionally use a rescue inhaler and anticipate needing a refill soon. You reported no recent cough, wheezing, or shortness of breath.  YOUR PLAN:  - COPD: COPD is a condition where your airways narrow and swell, producing extra mucus, which can make breathing difficult. Your COPD is well-controlled with your current inhaler regimen, and you have no recent symptoms like cough, wheezing, or shortness of breath. We will ensure your inhaler prescriptions are current and provide refills for your rescue inhaler as needed.  -INFLUENZA IMMUNIZATION: Influenza, or the flu, is a viral infection that attacks your respiratory system. You are due for your flu shot, and you have agreed to receive it today. We will administer the flu shot during this visit.  INSTRUCTIONS:  Please ensure you have your inhaler prescriptions filled and use them as directed. If you experience any worsening of your COPD symptoms, please contact our office. Additionally, you will receive your flu shot today to help protect you from the influenza virus.

## 2024-06-11 ENCOUNTER — Encounter: Payer: Self-pay | Admitting: Pulmonary Disease

## 2024-10-19 ENCOUNTER — Ambulatory Visit: Payer: Medicare Other

## 2024-11-11 ENCOUNTER — Other Ambulatory Visit

## 2024-11-24 ENCOUNTER — Ambulatory Visit: Admitting: Radiation Oncology

## 2025-02-10 ENCOUNTER — Encounter: Admitting: Nurse Practitioner
